# Patient Record
Sex: Male | Born: 1950 | Hispanic: No | Marital: Married | Smoking: Former smoker
Health system: Southern US, Community
[De-identification: ages and names within clinical notes are randomized; demographics above are authoritative.]

## PROBLEM LIST (undated history)

## (undated) DIAGNOSIS — J189 Pneumonia, unspecified organism: Secondary | ICD-10-CM

## (undated) DIAGNOSIS — K219 Gastro-esophageal reflux disease without esophagitis: Secondary | ICD-10-CM

## (undated) DIAGNOSIS — I10 Essential (primary) hypertension: Secondary | ICD-10-CM

## (undated) DIAGNOSIS — R42 Dizziness and giddiness: Secondary | ICD-10-CM

## (undated) DIAGNOSIS — I219 Acute myocardial infarction, unspecified: Secondary | ICD-10-CM

## (undated) DIAGNOSIS — E785 Hyperlipidemia, unspecified: Secondary | ICD-10-CM

## (undated) DIAGNOSIS — I2510 Atherosclerotic heart disease of native coronary artery without angina pectoris: Secondary | ICD-10-CM

## (undated) DIAGNOSIS — J310 Chronic rhinitis: Secondary | ICD-10-CM

## (undated) DIAGNOSIS — S065X9A Traumatic subdural hemorrhage with loss of consciousness of unspecified duration, initial encounter: Secondary | ICD-10-CM

## (undated) DIAGNOSIS — S065XAA Traumatic subdural hemorrhage with loss of consciousness status unknown, initial encounter: Secondary | ICD-10-CM

## (undated) HISTORY — PX: NASAL SINUS SURGERY: SHX719

## (undated) HISTORY — DX: Hyperlipidemia, unspecified: E78.5

## (undated) HISTORY — PX: EYE SURGERY: SHX253

## (undated) HISTORY — PX: NOSE SURGERY: SHX723

## (undated) HISTORY — DX: Dizziness and giddiness: R42

## (undated) HISTORY — PX: CORONARY STENT PLACEMENT: SHX1402

## (undated) HISTORY — DX: Atherosclerotic heart disease of native coronary artery without angina pectoris: I25.10

## (undated) HISTORY — PX: HAND SURGERY: SHX662

## (undated) HISTORY — DX: Gastro-esophageal reflux disease without esophagitis: K21.9

---

## 2010-06-27 ENCOUNTER — Emergency Department (HOSPITAL_COMMUNITY): Admission: EM | Admit: 2010-06-27 | Discharge: 2010-06-27 | Payer: Self-pay | Admitting: Emergency Medicine

## 2011-12-21 ENCOUNTER — Observation Stay (HOSPITAL_COMMUNITY)
Admission: EM | Admit: 2011-12-21 | Discharge: 2011-12-27 | Disposition: A | Discharge status: Skilled Nursing Facility | Payer: Medicaid Other | Attending: Internal Medicine | Admitting: Internal Medicine

## 2011-12-21 ENCOUNTER — Emergency Department (HOSPITAL_COMMUNITY): Payer: Medicaid Other

## 2011-12-21 ENCOUNTER — Encounter (HOSPITAL_COMMUNITY): Payer: Self-pay | Admitting: Emergency Medicine

## 2011-12-21 ENCOUNTER — Other Ambulatory Visit: Payer: Self-pay

## 2011-12-21 DIAGNOSIS — R109 Unspecified abdominal pain: Principal | ICD-10-CM | POA: Diagnosis present

## 2011-12-21 DIAGNOSIS — I10 Essential (primary) hypertension: Secondary | ICD-10-CM | POA: Diagnosis present

## 2011-12-21 DIAGNOSIS — Z23 Encounter for immunization: Secondary | ICD-10-CM | POA: Insufficient documentation

## 2011-12-21 DIAGNOSIS — E119 Type 2 diabetes mellitus without complications: Secondary | ICD-10-CM | POA: Diagnosis present

## 2011-12-21 DIAGNOSIS — I2510 Atherosclerotic heart disease of native coronary artery without angina pectoris: Secondary | ICD-10-CM | POA: Insufficient documentation

## 2011-12-21 DIAGNOSIS — K589 Irritable bowel syndrome without diarrhea: Secondary | ICD-10-CM | POA: Insufficient documentation

## 2011-12-21 DIAGNOSIS — D649 Anemia, unspecified: Secondary | ICD-10-CM | POA: Insufficient documentation

## 2011-12-21 DIAGNOSIS — K859 Acute pancreatitis without necrosis or infection, unspecified: Secondary | ICD-10-CM

## 2011-12-21 DIAGNOSIS — F3289 Other specified depressive episodes: Secondary | ICD-10-CM | POA: Insufficient documentation

## 2011-12-21 DIAGNOSIS — E876 Hypokalemia: Secondary | ICD-10-CM | POA: Diagnosis present

## 2011-12-21 DIAGNOSIS — Z9861 Coronary angioplasty status: Secondary | ICD-10-CM | POA: Insufficient documentation

## 2011-12-21 DIAGNOSIS — F329 Major depressive disorder, single episode, unspecified: Secondary | ICD-10-CM | POA: Insufficient documentation

## 2011-12-21 DIAGNOSIS — K838 Other specified diseases of biliary tract: Secondary | ICD-10-CM

## 2011-12-21 HISTORY — DX: Traumatic subdural hemorrhage with loss of consciousness of unspecified duration, initial encounter: S06.5X9A

## 2011-12-21 HISTORY — DX: Chronic rhinitis: J31.0

## 2011-12-21 HISTORY — DX: Traumatic subdural hemorrhage with loss of consciousness status unknown, initial encounter: S06.5XAA

## 2011-12-21 HISTORY — DX: Essential (primary) hypertension: I10

## 2011-12-21 HISTORY — DX: Dizziness and giddiness: R42

## 2011-12-21 HISTORY — DX: Acute myocardial infarction, unspecified: I21.9

## 2011-12-21 HISTORY — DX: Hyperlipidemia, unspecified: E78.5

## 2011-12-21 HISTORY — DX: Pneumonia, unspecified organism: J18.9

## 2011-12-21 LAB — DIFFERENTIAL
Eosinophils Absolute: 0.1 10*3/uL (ref 0.0–0.7)
Eosinophils Relative: 1 % (ref 0–5)
Lymphocytes Relative: 28 % (ref 12–46)
Lymphs Abs: 2.3 10*3/uL (ref 0.7–4.0)
Monocytes Absolute: 0.7 10*3/uL (ref 0.1–1.0)
Monocytes Relative: 9 % (ref 3–12)

## 2011-12-21 LAB — URINALYSIS, ROUTINE W REFLEX MICROSCOPIC
Ketones, ur: 15 mg/dL — AB
Leukocytes, UA: NEGATIVE
Nitrite: NEGATIVE
Protein, ur: NEGATIVE mg/dL
Urobilinogen, UA: 0.2 mg/dL (ref 0.0–1.0)

## 2011-12-21 LAB — PROTIME-INR
INR: 2.42 — ABNORMAL HIGH (ref 0.00–1.49)
Prothrombin Time: 26.7 seconds — ABNORMAL HIGH (ref 11.6–15.2)

## 2011-12-21 LAB — COMPREHENSIVE METABOLIC PANEL
ALT: 20 U/L (ref 0–53)
BUN: 12 mg/dL (ref 6–23)
CO2: 22 mEq/L (ref 19–32)
Calcium: 9.6 mg/dL (ref 8.4–10.5)
Creatinine, Ser: 0.89 mg/dL (ref 0.50–1.35)
GFR calc Af Amer: >90 mL/min (ref >90)
GFR calc non Af Amer: 90 mL/min — ABNORMAL LOW (ref >90)
Glucose, Bld: 116 mg/dL — ABNORMAL HIGH (ref 70–99)
Sodium: 139 mEq/L (ref 135–145)
Total Protein: 6.8 g/dL (ref 6.0–8.3)

## 2011-12-21 LAB — CBC
HCT: 39.8 % (ref 39.0–52.0)
MCH: 28.1 pg (ref 26.0–34.0)
MCV: 82.9 fL (ref 78.0–100.0)
Platelets: 322 10*3/uL (ref 150–400)
RBC: 4.8 MIL/uL (ref 4.22–5.81)
WBC: 8.2 10*3/uL (ref 4.0–10.5)

## 2011-12-21 LAB — LIPASE, BLOOD: Lipase: 69 U/L — ABNORMAL HIGH (ref 11–59)

## 2011-12-21 LAB — CARDIAC PANEL(CRET KIN+CKTOT+MB+TROPI): Troponin I: <0.3 ng/mL (ref <0.30)

## 2011-12-21 MED ORDER — DOCUSATE SODIUM 100 MG PO CAPS
100.0000 mg | ORAL_CAPSULE | Freq: Two times a day (BID) | ORAL | Status: DC
  Administered 2011-12-21 – 2011-12-22 (×2): 100 mg via ORAL
  Filled 2011-12-21 (×3): qty 1

## 2011-12-21 MED ORDER — GABAPENTIN 100 MG PO CAPS
100.0000 mg | ORAL_CAPSULE | Freq: Three times a day (TID) | ORAL | Status: DC
  Administered 2011-12-21 – 2011-12-23 (×5): 100 mg via ORAL
  Filled 2011-12-21 (×7): qty 1

## 2011-12-21 MED ORDER — PANTOPRAZOLE SODIUM 40 MG PO TBEC
40.0000 mg | DELAYED_RELEASE_TABLET | Freq: Every day | ORAL | Status: DC
  Administered 2011-12-21 – 2011-12-27 (×7): 40 mg via ORAL
  Filled 2011-12-21 (×7): qty 1

## 2011-12-21 MED ORDER — FLUOXETINE HCL 40 MG PO CAPS: 80.0000 mg | ORAL_CAPSULE | Freq: Every day | ORAL | Status: DC

## 2011-12-21 MED ORDER — TAMSULOSIN HCL 0.4 MG PO CAPS
0.4000 mg | ORAL_CAPSULE | Freq: Every day | ORAL | Status: DC
  Administered 2011-12-21 – 2011-12-26 (×6): 0.4 mg via ORAL
  Filled 2011-12-21 (×8): qty 1

## 2011-12-21 MED ORDER — INSULIN ASPART 100 UNIT/ML ~~LOC~~ SOLN
0.0000 [IU] | Freq: Three times a day (TID) | SUBCUTANEOUS | Status: DC
  Administered 2011-12-23: 1 [IU] via SUBCUTANEOUS
  Filled 2011-12-21: qty 3

## 2011-12-21 MED ORDER — INSULIN ASPART 100 UNIT/ML ~~LOC~~ SOLN: 0.0000 [IU] | Freq: Every day | SUBCUTANEOUS | Status: DC

## 2011-12-21 MED ORDER — MEMANTINE HCL 10 MG PO TABS
10.0000 mg | ORAL_TABLET | Freq: Two times a day (BID) | ORAL | Status: DC
  Administered 2011-12-21 – 2011-12-23 (×4): 10 mg via ORAL
  Filled 2011-12-21 (×5): qty 1

## 2011-12-21 MED ORDER — SODIUM CHLORIDE 0.9 % IJ SOLN
3.0000 mL | Freq: Two times a day (BID) | INTRAMUSCULAR | Status: DC
  Administered 2011-12-22 – 2011-12-25 (×8): 3 mL via INTRAVENOUS

## 2011-12-21 MED ORDER — SIMVASTATIN 20 MG PO TABS
20.0000 mg | ORAL_TABLET | Freq: Every day | ORAL | Status: DC
  Administered 2011-12-22 – 2011-12-26 (×5): 20 mg via ORAL
  Filled 2011-12-21 (×6): qty 1

## 2011-12-21 MED ORDER — MORPHINE SULFATE 4 MG/ML IJ SOLN
4.0000 mg | Freq: Once | INTRAMUSCULAR | Status: AC
  Administered 2011-12-21: 4 mg via INTRAVENOUS
  Filled 2011-12-21: qty 1

## 2011-12-21 MED ORDER — METOPROLOL SUCCINATE ER 50 MG PO TB24
50.0000 mg | ORAL_TABLET | Freq: Every day | ORAL | Status: DC
  Administered 2011-12-22 – 2011-12-27 (×6): 50 mg via ORAL
  Filled 2011-12-21 (×6): qty 1

## 2011-12-21 MED ORDER — ASPIRIN EC 81 MG PO TBEC
81.0000 mg | DELAYED_RELEASE_TABLET | Freq: Every day | ORAL | Status: DC
  Administered 2011-12-21 – 2011-12-27 (×7): 81 mg via ORAL
  Filled 2011-12-21 (×7): qty 1

## 2011-12-21 MED ORDER — LISINOPRIL 5 MG PO TABS
5.0000 mg | ORAL_TABLET | Freq: Every day | ORAL | Status: DC
  Administered 2011-12-21 – 2011-12-27 (×7): 5 mg via ORAL
  Filled 2011-12-21 (×7): qty 1

## 2011-12-21 MED ORDER — POLYVINYL ALCOHOL 1.4 % OP SOLN
1.0000 [drp] | OPHTHALMIC | Status: DC | PRN
  Filled 2011-12-21: qty 15

## 2011-12-21 MED ORDER — ONDANSETRON HCL 4 MG/2ML IJ SOLN: 4.0000 mg | Freq: Four times a day (QID) | INTRAMUSCULAR | Status: DC | PRN

## 2011-12-21 MED ORDER — IOHEXOL 300 MG/ML  SOLN: 20.0000 mL | INTRAMUSCULAR | Status: AC

## 2011-12-21 MED ORDER — ONDANSETRON HCL 4 MG PO TABS: 4.0000 mg | ORAL_TABLET | Freq: Four times a day (QID) | ORAL | Status: DC | PRN

## 2011-12-21 MED ORDER — SODIUM CHLORIDE 0.9 % IV SOLN
Freq: Once | INTRAVENOUS | Status: AC
  Administered 2011-12-21: 13:00:00 via INTRAVENOUS

## 2011-12-21 MED ORDER — POTASSIUM CHLORIDE CRYS ER 20 MEQ PO TBCR
40.0000 meq | EXTENDED_RELEASE_TABLET | Freq: Once | ORAL | Status: AC
  Administered 2011-12-21: 40 meq via ORAL
  Filled 2011-12-21: qty 2

## 2011-12-21 MED ORDER — SODIUM CHLORIDE 0.9 % IJ SOLN: 3.0000 mL | INTRAMUSCULAR | Status: DC | PRN

## 2011-12-21 MED ORDER — POLYETHYL GLYCOL-PROPYL GLYCOL 0.4-0.3 % OP SOLN: 1.0000 [drp] | Freq: Every day | OPHTHALMIC | Status: DC | PRN

## 2011-12-21 MED ORDER — ONDANSETRON HCL 4 MG/2ML IJ SOLN
4.0000 mg | Freq: Once | INTRAMUSCULAR | Status: AC
  Administered 2011-12-21: 4 mg via INTRAVENOUS
  Filled 2011-12-21: qty 2

## 2011-12-21 MED ORDER — HYDROMORPHONE HCL PF 1 MG/ML IJ SOLN
1.0000 mg | Freq: Once | INTRAMUSCULAR | Status: AC
  Administered 2011-12-21: 1 mg via INTRAVENOUS
  Filled 2011-12-21: qty 1

## 2011-12-21 MED ORDER — SUCRALFATE 1 GM/10ML PO SUSP
1.0000 g | Freq: Four times a day (QID) | ORAL | Status: DC
  Administered 2011-12-21 – 2011-12-27 (×23): 1 g via ORAL
  Filled 2011-12-21 (×26): qty 10

## 2011-12-21 MED ORDER — GI COCKTAIL ~~LOC~~
30.0000 mL | Freq: Two times a day (BID) | ORAL | Status: DC | PRN
  Administered 2011-12-21 – 2011-12-25 (×5): 30 mL via ORAL
  Filled 2011-12-21 (×6): qty 30

## 2011-12-21 MED ORDER — NITROGLYCERIN 0.4 MG SL SUBL: 0.4000 mg | SUBLINGUAL_TABLET | SUBLINGUAL | Status: DC | PRN

## 2011-12-21 MED ORDER — IOHEXOL 300 MG/ML  SOLN
100.0000 mL | Freq: Once | INTRAMUSCULAR | Status: AC | PRN
  Administered 2011-12-21: 100 mL via INTRAVENOUS

## 2011-12-21 MED ORDER — SODIUM CHLORIDE 0.9 % IV SOLN: 250.0000 mL | INTRAVENOUS | Status: DC | PRN

## 2011-12-21 NOTE — ED Notes (Signed)
[id number] 

## 2011-12-21 NOTE — ED Notes (Signed)
MD at bedside. 

## 2011-12-21 NOTE — ED Notes (Signed)
Pt drinking contrast fluid

## 2011-12-21 NOTE — ED Notes (Signed)
Reordering urinalysis as pt was unable to produce urine.  Collection was removed by mistake

## 2011-12-21 NOTE — ED Notes (Signed)
Pt

## 2011-12-21 NOTE — ED Notes (Signed)
Flow manager called to er stating that pt inpatient bed was going to be changed to 5503, as the admitting doctor wanted pt to be a tele bed.  ED bed request was for tele as dictated by the er physician.  Flow manager put the wrong bed request and pt is in the initial inpatient bed of 5126 that was assigned to him.  Flow manager states that now we need to call 5500 and let them know that pt is going to be coming to them.   ER charge nurse notified of the situation.
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
## 2011-12-21 NOTE — ED Notes (Signed)
Pt presents with rt sided chest pain.  History of PE, MI and recent pneumonia. Diabetes.  Was woken with pain in rt chest and rt upper abdominal areas.

## 2011-12-21 NOTE — ED Notes (Signed)
Pt sleeping at this time.

## 2011-12-21 NOTE — ED Notes (Signed)
[id number] Ready 

## 2011-12-21 NOTE — ED Provider Notes (Signed)
History     CSN: [id number]  Arrival date & time 12/21/11  1120   First MD Initiated Contact with Patient 12/21/11 1127      Chief Complaint  Patient presents with  . Chest Pain    (Consider location/radiation/quality/duration/timing/severity/associated sxs/prior treatment) Patient is a 61 y.o. male presenting with chest pain. The history is provided by the patient. The history is limited by the condition of the patient (Poor historian).  Chest Pain   Pain is actually in the right upper abdomen and extending down toward the umbilicus. He cannot describe the pain nor can he give a number for its intensity and when asked more specifically about it does states that he is too tired to answer questions. Niacin fever, chills, sweats and denies nausea or vomiting. He is complaining of some dyspnea. Denies constipation or diarrhea. I cannot get any further coherent history from the patient.  Past Medical History  Diagnosis Date  . MI (myocardial infarction)   . Pneumonia   . PE (pulmonary embolism)     No past surgical history on file.  No family history on file.  History  Substance Use Topics  . Smoking status: Not on file  . Smokeless tobacco: Not on file  . Alcohol Use:       Review of Systems  Unable to perform ROS: Other  Cardiovascular: Positive for chest pain.    Allergies  Review of patient's allergies indicates no known allergies.  Home Medications   Current Outpatient Rx  Name Route Sig Dispense Refill  . CALCIUM CARBONATE-VITAMIN D 500-200 MG-UNIT PO TABS Oral Take 1 tablet by mouth daily.    Marland Kitchen FLUOXETINE HCL 40 MG PO CAPS Oral Take 80 mg by mouth daily.    Marland Kitchen GABAPENTIN 100 MG PO CAPS Oral Take 100 mg by mouth 3 (three) times daily.    Marland Kitchen HYDROXYZINE HCL 50 MG PO TABS Oral Take 50 mg by mouth 4 (four) times daily.    . IBUPROFEN 800 MG PO TABS Oral Take 800 mg by mouth 3 (three) times daily.    Marland Kitchen KETOROLAC TROMETHAMINE 0.5 % OP SOLN Ophthalmic Apply 1 drop  to eye 4 (four) times daily.    Marland Kitchen LISINOPRIL 5 MG PO TABS Oral Take 5 mg by mouth daily.    Marland Kitchen LORATADINE 10 MG PO TABS Oral Take 10 mg by mouth daily as needed. For allergies    . MEGESTROL ACETATE 40 MG/ML PO SUSP Oral Take 400 mg by mouth 2 (two) times daily.    Marland Kitchen MEMANTINE HCL 10 MG PO TABS Oral Take 10 mg by mouth 2 (two) times daily.    Marland Kitchen METFORMIN HCL 500 MG PO TABS Oral Take 500 mg by mouth 2 (two) times daily.    Marland Kitchen METOPROLOL SUCCINATE ER 50 MG PO TB24 Oral Take 50 mg by mouth 2 (two) times daily. Take with or immediately following a meal.    . ADULT MULTIVITAMIN W/MINERALS CH Oral Take 1 tablet by mouth daily.    Marland Kitchen NALTREXONE HCL 50 MG PO TABS Oral Take 50 mg by mouth daily.    Marland Kitchen NITROGLYCERIN 0.4 MG SL SUBL Sublingual Place 0.4 mg under the tongue every 5 (five) minutes as needed. For chest pain    . OMEPRAZOLE 40 MG PO CPDR Oral Take 40 mg by mouth daily.    Marland Kitchen PILOCARPINE HCL 5 MG PO TABS Oral Take 5 mg by mouth 4 (four) times daily.    Marland Kitchen POLYETHYL GLYCOL-PROPYL  GLYCOL 0.4-0.3 % OP SOLN Ophthalmic Apply 1 drop to eye daily as needed. For dry eyes    . PRAVASTATIN SODIUM 40 MG PO TABS Oral Take 40 mg by mouth daily.    . SUCRALFATE 1 GM/10ML PO SUSP Oral Take 1 g by mouth 4 (four) times daily.    Marland Kitchen TAMSULOSIN HCL 0.4 MG PO CAPS Oral Take 0.4 mg by mouth at bedtime.    . TOPIRAMATE 25 MG PO CPSP Oral Take 25 mg by mouth 3 (three) times daily.    . TRAMADOL HCL 50 MG PO TABS Oral Take 50 mg by mouth every 8 (eight) hours as needed. For pain    . WARFARIN SODIUM 5 MG PO TABS Oral Take 5 mg by mouth as directed.      BP 132/100  Pulse 93  Temp(Src) 98.6 F (37 C) (Oral)  Resp 20  SpO2 99%  Physical Exam  Nursing note and vitals reviewed.  D30-year-old male who appears somewhat dyspneic. Respiratory rate is reported at 20 however I'm count closer to 30. Vital signs are significant for mild hypertension with blood pressure 132/100. Oxygen saturation is 99% which is normal. Head is  normocephalic and atraumatic. PERRLA, EOMI. There is no scleral icterus. Mucous membranes are moist. Neck is supple without adenopathy or JVD and is nontender. Back is nontender. Lungs are clear without rales, wheezes, or rhonchi. There is no chest wall tenderness. Heart has regular rate rhythm without murmur. Abdomen is soft with moderate right upper quadrant tenderness with positive Murphy sign. There is no hepatosplenomegaly. No other tenderness is identified. Peristalsis is diminished. Extremities have no cyanosis or edema, full range of motion is present. Skin is warm and moist without rash. Logic: Mental status is normal, cranial nerves are intact, there no focal motor or sensory deficits.  ED Course  Procedures (including critical care time)  Results for orders placed during the hospital encounter of 12/21/11  CBC      Component Value Range   WBC 8.2  4.0 - 10.5 (K/uL)   RBC 4.80  4.22 - 5.81 (MIL/uL)   Hemoglobin 13.5  13.0 - 17.0 (g/dL)   HCT 21.3  08.6 - 57.8 (%)   MCV 82.9  78.0 - 100.0 (fL)   MCH 28.1  26.0 - 34.0 (pg)   MCHC 33.9  30.0 - 36.0 (g/dL)   RDW 46.9  62.9 - 52.8 (%)   Platelets 322  150 - 400 (K/uL)  DIFFERENTIAL      Component Value Range   Neutrophils Relative 61  43 - 77 (%)   Neutro Abs 5.0  1.7 - 7.7 (K/uL)   Lymphocytes Relative 28  12 - 46 (%)   Lymphs Abs 2.3  0.7 - 4.0 (K/uL)   Monocytes Relative 9  3 - 12 (%)   Monocytes Absolute 0.7  0.1 - 1.0 (K/uL)   Eosinophils Relative 1  0 - 5 (%)   Eosinophils Absolute 0.1  0.0 - 0.7 (K/uL)   Basophils Relative 1  0 - 1 (%)   Basophils Absolute 0.0  0.0 - 0.1 (K/uL)  COMPREHENSIVE METABOLIC PANEL      Component Value Range   Sodium 139  135 - 145 (mEq/L)   Potassium 3.3 (*) 3.5 - 5.1 (mEq/L)   Chloride 106  96 - 112 (mEq/L)   CO2 22  19 - 32 (mEq/L)   Glucose, Bld 116 (*) 70 - 99 (mg/dL)   BUN 12  6 - 23 (mg/dL)  Creatinine, Ser 0.89  0.50 - 1.35 (mg/dL)   Calcium 9.6  8.4 - 16.1 (mg/dL)   Total Protein  6.8  6.0 - 8.3 (g/dL)   Albumin 3.4 (*) 3.5 - 5.2 (g/dL)   AST 17  0 - 37 (U/L)   ALT 20  0 - 53 (U/L)   Alkaline Phosphatase 157 (*) 39 - 117 (U/L)   Total Bilirubin 0.3  0.3 - 1.2 (mg/dL)   GFR calc non Af Amer 90 (*) >90 (mL/min)   GFR calc Af Amer >90  >90 (mL/min)  LIPASE, BLOOD      Component Value Range   Lipase 69 (*) 11 - 59 (U/L)  PROTIME-INR      Component Value Range   Prothrombin Time 26.7 (*) 11.6 - 15.2 (seconds)   INR 2.42 (*) 0.00 - 1.49    US Abdomen Complete  12/21/2011  *RADIOLOGY REPORT*  Clinical Data:  Right upper quadrant abdominal pain.  COMPLETE ABDOMINAL ULTRASOUND  Comparison:  None.  Findings:  Gallbladder:  Normal, without wall thickening, stone, or pericholecystic fluid.  Sonographic Murphy's sign was not elicited.  Common bile duct: The common duct measures between 7 and 9 mm. Upper normal for patient age 47-7 mm.  No intrahepatic biliary ductal dilatation.  Liver: Normal in echogenicity, without focal lesion.  IVC: Negative  Pancreas:  Pancreatic tail partially obscured by bowel gas.  Spleen:  Normal in size and echogenicity.  Right Kidney:  12.6 cm. No hydronephrosis.  Mild renal cortical thinning.  A simple cyst measures 7.8 cm in the interpolar right kidney.  A lower pole right-sided renal lesion measures 3.3 cm, including image 46.  This has a suggestion of minimal irregularity in its posterior wall.  Left Kidney:  11.6 cm. No hydronephrosis.  Mild renal cortical thinning.  Interpolar 1.8 cm cyst.  Abdominal aorta:  Nonaneurysmal without ascites.  The aorta is partially obscured by bowel gas distally.  IMPRESSION:  1.  Normal gallbladder.  The common duct is minimally dilated for age.  Given the normal bilirubin, this may be within normal variation.  If there is a high clinical concern of biliary obstruction, consider MRCP. 2.  Bilateral renal cystic lesions.  Lower pole right-sided renal lesion has a suggestion of complexity in its posterior portion. This could  represent a complex cyst.  Cystic neoplasm cannot be excluded.  Consider further characterization with non emergent pre and post contrast abdominal CT.  If this is not performed, ultrasound surveillance at 6 - 12 months should be considered.  Original Report Authenticated By: Consuello Bossier, M.D.   Dg Chest Portable 1 View  12/21/2011  *RADIOLOGY REPORT*  Clinical Data: Chest pain  PORTABLE CHEST - 1 VIEW  Comparison: None.  Findings: Mild cardiomegaly.  Low volumes.  Normal pulmonary vascularity.  Mild bibasilar atelectasis.  Bronchitic changes.  No pneumothorax.  IMPRESSION: Cardiomegaly without edema.  Bibasilar atelectasis.  Original Report Authenticated By: Donavan Burnet, M.D.      1. Pancreatitis   2. Common bile duct dilatation      Date: 12/21/2011  Rate: 88  Rhythm: normal sinus rhythm  QRS Axis: left  Intervals: normal  ST/T Wave abnormalities: nonspecific T wave changes  Conduction Disutrbances:left anterior fascicular block and Incomplete right bundle-branch block  Narrative Interpretation: Left anterior fascicular block, incomplete right bundle branch block with a nonspecific T wave flattening. When compared with ECG of 06/27/2010, all of the above changes are new.  Old EKG Reviewed: changes noted  He is given IV morphine with slight relief of pain. This is followed with IV Dilaudid with better relief of pain. Ultrasound shows mildly dilated common bile duct. Elevated lipase and elevated outcome phosphatase suggest some degree of obstruction and a CT has been ordered. Case is discussed with the resident on call for the teaching service and arrangements are made to admit the patient for pain control and further diagnostic evaluation. Need to consider possibility of pancreatic malignancy.  MDM  Abdominal pain and right upper quadrant tenderness-need to rule out biliary colic. Ultrasound has been ordered.        Dione Booze, MD 12/21/11 1455

## 2011-12-21 NOTE — ED Notes (Signed)
Pt states pain 7/10.  Would like more pain medication

## 2011-12-22 ENCOUNTER — Encounter (HOSPITAL_COMMUNITY): Payer: Self-pay | Admitting: General Practice

## 2011-12-22 ENCOUNTER — Other Ambulatory Visit: Payer: Self-pay

## 2011-12-22 LAB — GLUCOSE, CAPILLARY
Glucose-Capillary: 96 mg/dL (ref 70–99)
Glucose-Capillary: 94 mg/dL (ref 70–99)
Glucose-Capillary: 89 mg/dL (ref 70–99)
Glucose-Capillary: 98 mg/dL (ref 70–99)
Glucose-Capillary: 129 mg/dL — ABNORMAL HIGH (ref 70–99)

## 2011-12-22 LAB — CARDIAC PANEL(CRET KIN+CKTOT+MB+TROPI)
CK, MB: 3.4 ng/mL (ref 0.3–4.0)
Relative Index: INVALID (ref 0.0–2.5)
Relative Index: INVALID (ref 0.0–2.5)
Total CK: 48 U/L (ref 7–232)
Troponin I: <0.3 ng/mL (ref <0.30)
Troponin I: <0.3 ng/mL (ref <0.30)

## 2011-12-22 LAB — COMPREHENSIVE METABOLIC PANEL
AST: 15 U/L (ref 0–37)
Albumin: 2.9 g/dL — ABNORMAL LOW (ref 3.5–5.2)
Alkaline Phosphatase: 129 U/L — ABNORMAL HIGH (ref 39–117)
Chloride: 109 mEq/L (ref 96–112)
Potassium: 3.6 mEq/L (ref 3.5–5.1)
Total Bilirubin: 0.3 mg/dL (ref 0.3–1.2)

## 2011-12-22 LAB — PROTIME-INR
INR: 3.03 — ABNORMAL HIGH (ref 0.00–1.49)
Prothrombin Time: 31.9 seconds — ABNORMAL HIGH (ref 11.6–15.2)

## 2011-12-22 LAB — CBC
HCT: 35.6 % — ABNORMAL LOW (ref 39.0–52.0)
HCT: 34.8 % — ABNORMAL LOW (ref 39.0–52.0)
MCH: 28.1 pg (ref 26.0–34.0)
MCH: 27.8 pg (ref 26.0–34.0)
MCHC: 33.7 g/dL (ref 30.0–36.0)
MCHC: 33.3 g/dL (ref 30.0–36.0)
MCV: 83.4 fL (ref 78.0–100.0)
Platelets: 258 10*3/uL (ref 150–400)
Platelets: 245 10*3/uL (ref 150–400)
RBC: 4.27 MIL/uL (ref 4.22–5.81)
RBC: 4.18 MIL/uL — ABNORMAL LOW (ref 4.22–5.81)
RDW: 13.1 % (ref 11.5–15.5)
WBC: 6.9 10*3/uL (ref 4.0–10.5)

## 2011-12-22 LAB — RAPID URINE DRUG SCREEN, HOSP PERFORMED
Barbiturates: NOT DETECTED
Benzodiazepines: NOT DETECTED
Opiates: POSITIVE — AB

## 2011-12-22 LAB — MAGNESIUM: Magnesium: 1.8 mg/dL (ref 1.5–2.5)

## 2011-12-22 LAB — OCCULT BLOOD X 1 CARD TO LAB, STOOL: Fecal Occult Bld: NEGATIVE

## 2011-12-22 MED ORDER — SENNA 8.6 MG PO TABS
1.0000 | ORAL_TABLET | Freq: Every day | ORAL | Status: DC
  Administered 2011-12-22 – 2011-12-27 (×6): 8.6 mg via ORAL
  Filled 2011-12-22 (×6): qty 1

## 2011-12-22 MED ORDER — ACETAMINOPHEN 325 MG PO TABS
650.0000 mg | ORAL_TABLET | Freq: Four times a day (QID) | ORAL | Status: DC | PRN
  Administered 2011-12-22 – 2011-12-27 (×6): 650 mg via ORAL
  Filled 2011-12-22 (×6): qty 2

## 2011-12-22 MED ORDER — FLORA-Q PO CAPS
1.0000 | ORAL_CAPSULE | Freq: Every day | ORAL | Status: DC
  Administered 2011-12-22 – 2011-12-27 (×6): 1 via ORAL
  Filled 2011-12-22 (×6): qty 1

## 2011-12-22 MED ORDER — PNEUMOCOCCAL VAC POLYVALENT 25 MCG/0.5ML IJ INJ
0.5000 mL | INJECTION | INTRAMUSCULAR | Status: AC
  Administered 2011-12-23: 0.5 mL via INTRAMUSCULAR
  Filled 2011-12-22: qty 0.5

## 2011-12-22 MED ORDER — INFLUENZA VIRUS VACC SPLIT PF IM SUSP
0.5000 mL | INTRAMUSCULAR | Status: AC
  Administered 2011-12-23: 0.5 mL via INTRAMUSCULAR
  Filled 2011-12-22: qty 0.5

## 2011-12-22 MED ORDER — MORPHINE SULFATE 2 MG/ML IJ SOLN
2.0000 mg | Freq: Once | INTRAMUSCULAR | Status: AC
  Administered 2011-12-22: 2 mg via INTRAVENOUS
  Filled 2011-12-22: qty 1

## 2011-12-22 MED ORDER — ALIGN PO CAPS: 1.0000 | ORAL_CAPSULE | Freq: Every day | ORAL | Status: DC

## 2011-12-22 MED ORDER — FLUOXETINE HCL 20 MG PO CAPS
40.0000 mg | ORAL_CAPSULE | Freq: Every day | ORAL | Status: DC
  Administered 2011-12-22 – 2011-12-23 (×2): 40 mg via ORAL
  Filled 2011-12-22 (×2): qty 2

## 2011-12-22 NOTE — Progress Notes (Signed)
Late Entry: 12/21/2011 1930  Pt admitted to the unit. Pt is alert and oriented. Pt oriented to room, staff, and call bell. Bed in lowest position. Full assessment to Epic. Call bell with in reach. Told to call for assists. Will continue to monitor.  Ian Morton
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
## 2011-12-22 NOTE — Progress Notes (Signed)
EKG done at 6:45. MD. Called with the results. Ian Morton

## 2011-12-22 NOTE — Progress Notes (Addendum)
Subjective: Still in significant pain.  Tells me he had watery BM.  Ate and tolerated dinner.  No N/V, just sharp RUQ abd pain worse with breathing.    Objective: Vital signs in last 24 hours: Filed Vitals:   12/21/11 1530 12/21/11 1720 12/21/11 2028 12/22/11 0411  BP: 138/90 143/86 153/97 114/73  Pulse: 76 75 78 91  Temp:   98.2 F (36.8 C) 97.6 F (36.4 C)  TempSrc:   Oral Oral  Resp: 18 22 20 20   SpO2: 99% 96% 100% 99%   Weight change:   Intake/Output Summary (Last 24 hours) at 12/22/11 0821 Last data filed at 12/22/11 0406  Gross per 24 hour  Intake      3 ml  Output      0 ml  Net      3 ml   Physical Exam: General: resting in bed, appears uncomfortable/in distress, cooperative to exam HEENT: PERRL, EOMI, no scleral icterus, no conjunctival pallor Cardiac: RRR, no rubs, murmurs or gallops Pulm: clear to auscultation bilaterally, moving normal volumes of air Abd: soft, diffusely tender, but worse in RUQ, nondistended, BS normoactive Ext: warm and well perfused, no pedal edema Neuro: alert and oriented X3, cranial nerves II-XII grossly intact  Lab Results: Basic Metabolic Panel:  Lab 12/22/11 6962 12/21/11 1206  NA 138 139  K 3.6 3.3*  CL 109 106  CO2 20 22  GLUCOSE 90 116*  BUN 9 12  CREATININE 0.99 0.89  CALCIUM 9.0 9.6  MG 1.8 --  PHOS -- --   Liver Function Tests:  Lab 12/22/11 0252 12/21/11 1206  AST 15 17  ALT 18 20  ALKPHOS 129* 157*  BILITOT 0.3 0.3  PROT 5.6* 6.8  ALBUMIN 2.9* 3.4*    Lab 12/21/11 1206  LIPASE 69*  AMYLASE --   CBC:  Lab 12/22/11 0252 12/21/11 1206  WBC 7.2 8.2  NEUTROABS -- 5.0  HGB 12.0* 13.5  HCT 35.6* 39.8  MCV 83.4 82.9  PLT 258 322   Cardiac Enzymes:  Lab 12/22/11 0252 12/21/11 1945  CKTOTAL 48 45  CKMB 3.4 3.1  CKMBINDEX -- --  TROPONINI <0.30 <0.30   CBG:  Lab 12/22/11 0809 12/22/11 0340 12/21/11 2340  GLUCAP 94 96 84   Hemoglobin A1C:  Lab 12/21/11 1945  HGBA1C 6.8*   Coagulation:  Lab  12/22/11 0252 12/21/11 1206  LABPROT 31.9* 26.7*  INR 3.03* 2.42*   Urine Drug Screen: Drugs of Abuse     Component Value Date/Time   LABOPIA POSITIVE* 12/22/2011 0527   COCAINSCRNUR NONE DETECTED 12/22/2011 0527   LABBENZ NONE DETECTED 12/22/2011 0527   AMPHETMU NONE DETECTED 12/22/2011 0527   THCU NONE DETECTED 12/22/2011 0527   LABBARB NONE DETECTED 12/22/2011 0527    Alcohol Level:  Lab 12/21/11 1945  ETH <11   Studies/Results: US Abdomen Complete  12/21/2011  *RADIOLOGY REPORT*  Clinical Data:  Right upper quadrant abdominal pain.  COMPLETE ABDOMINAL ULTRASOUND  Comparison:  None.  Findings:  Gallbladder:  Normal, without wall thickening, stone, or pericholecystic fluid.  Sonographic Murphy's sign was not elicited.  Common bile duct: The common duct measures between 7 and 9 mm. Upper normal for patient age 64-7 mm.  No intrahepatic biliary ductal dilatation.  Liver: Normal in echogenicity, without focal lesion.  IVC: Negative  Pancreas:  Pancreatic tail partially obscured by bowel gas.  Spleen:  Normal in size and echogenicity.  Right Kidney:  12.6 cm. No hydronephrosis.  Mild renal cortical thinning.  A simple cyst measures 7.8 cm in the interpolar right kidney.  A lower pole right-sided renal lesion measures 3.3 cm, including image 46.  This has a suggestion of minimal irregularity in its posterior wall.  Left Kidney:  11.6 cm. No hydronephrosis.  Mild renal cortical thinning.  Interpolar 1.8 cm cyst.  Abdominal aorta:  Nonaneurysmal without ascites.  The aorta is partially obscured by bowel gas distally.  IMPRESSION:  1.  Normal gallbladder.  The common duct is minimally dilated for age.  Given the normal bilirubin, this may be within normal variation.  If there is a high clinical concern of biliary obstruction, consider MRCP. 2.  Bilateral renal cystic lesions.  Lower pole right-sided renal lesion has a suggestion of complexity in its posterior portion. This could represent a complex cyst.   Cystic neoplasm cannot be excluded.  Consider further characterization with non emergent pre and post contrast abdominal CT.  If this is not performed, ultrasound surveillance at 6 - 12 months should be considered.  Original Report Authenticated By: Consuello Bossier, M.D.   Ct Abdomen Pelvis W Contrast  12/21/2011  *RADIOLOGY REPORT*  Clinical Data: Abdominal pain; fever and chills  CT ABDOMEN AND PELVIS WITH CONTRAST  Technique:  Multidetector CT imaging of the abdomen and pelvis was performed following the standard protocol during bolus administration of intravenous contrast.  Contrast: OMNIPAQUE IOHEXOL 300 MG/ML IV SOLN  Comparison: None.  Findings: The lung bases are clear.  Coronary artery, aortoiliac, and mesenteric atherosclerotic calcification is present.  The liver, gallbladder, spleen, pancreas, adrenal glands, urinary bladder have a normal appearance.  There are degenerative changes within the axial spine.  There are bilateral simple renal cysts. The largest is on the right, measuring 7.8 cm.  The bowel is unremarkable with no evidence of gross inflammation or obstruction. There is no pneumoperitoneum, free fluid, or adenopathy within the abdomen or pelvis.  IMPRESSION: There are bilateral simple renal cysts.  The exam is otherwise negative.  Original Report Authenticated By: Brandon Melnick, M.D.   Dg Chest Portable 1 View  12/21/2011  *RADIOLOGY REPORT*  Clinical Data: Chest pain  PORTABLE CHEST - 1 VIEW  Comparison: None.  Findings: Mild cardiomegaly.  Low volumes.  Normal pulmonary vascularity.  Mild bibasilar atelectasis.  Bronchitic changes.  No pneumothorax.  IMPRESSION: Cardiomegaly without edema.  Bibasilar atelectasis.  Original Report Authenticated By: Donavan Burnet, M.D.   Medications: I have reviewed the patient's current medications. Scheduled Meds:   . sodium chloride   Intravenous Once  . aspirin EC  81 mg Oral Daily  . docusate sodium  100 mg Oral BID  . gabapentin  100  mg Oral TID  . HYDROmorphone  1 mg Intravenous Once  . insulin aspart  0-5 Units Subcutaneous QHS  . insulin aspart  0-9 Units Subcutaneous TID WC  . iohexol  20 mL Oral Q1 Hr x 2  . lisinopril  5 mg Oral Daily  . memantine  10 mg Oral BID  . metoprolol succinate  50 mg Oral Daily  .  morphine injection  4 mg Intravenous Once  . ondansetron (ZOFRAN) IV  4 mg Intravenous Once  . ondansetron (ZOFRAN) IV  4 mg Intravenous Once  . pantoprazole  40 mg Oral Q1200  . potassium chloride  40 mEq Oral Once  . simvastatin  20 mg Oral q1800  . sodium chloride  3 mL Intravenous Q12H  . sucralfate  1 g Oral QID  . Tamsulosin  HCl  0.4 mg Oral QHS  . DISCONTD: FLUoxetine  80 mg Oral Daily   Continuous Infusions:  PRN Meds:.sodium chloride, acetaminophen, gi cocktail, iohexol, nitroGLYCERIN, ondansetron (ZOFRAN) IV, ondansetron, polyvinyl alcohol, sodium chloride, DISCONTD: Polyethyl Glycol-Propyl Glycol  Assessment/Plan:  #RUQ pain: Still unclear etiology, but not entirely convincing for pancreatitis.  I spoke with patient's wife, who tells me that pain has been persistent for the last 2 years with significant constipation alternating with diarrhea, with more diarrhea. She also tells me he sometimes loses bowel continence.  She tells me he has had a decreased appetite for months, as well as depression, and he complains all the time.  Patient has been without a job for the last 12 years that his wife can note (they were married 12 years ago).  Their source for financial income is disability, as wife has lost quit her job a month ago as well.  I suspect there is a large psych component, and patient may be suffering from IBS.  Patient has been treated with an SSRI, but that does not seem to have helped.  -Will advance diet, treat with align and senokot   #Normocytic Anemia: Patient's Hb was wnl at admission and has dropped to 11.6 with repeat CBC, no IVF hydration, FOBT (-), will monitor CBC with AML, no  active source of bleeding, mental status is normal.  #Hypokalemia: resolved. Mg wnl.  #CAD: No active issues, CE (-) x 3, continue ASA 81 & SL NTG prn cp  #DM: reasonably well managed, no active issues -cont SSI & CBG ACHS  #HTN: well managed, continue lisinopril and metoprolol  #h/o PE: history verified by PCP records, will request coumadin consult by pharmacy, INR supratherapuetic today  #VTE: anticoagulation   LOS: 1 day   KAPADIA, Athanasios Heldman 12/22/2011, 8:22 AM

## 2011-12-23 LAB — COMPREHENSIVE METABOLIC PANEL
AST: 14 U/L (ref 0–37)
Albumin: 2.9 g/dL — ABNORMAL LOW (ref 3.5–5.2)
Alkaline Phosphatase: 128 U/L — ABNORMAL HIGH (ref 39–117)
BUN: 8 mg/dL (ref 6–23)
CO2: 25 mEq/L (ref 19–32)
Calcium: 9.1 mg/dL (ref 8.4–10.5)
Creatinine, Ser: 0.96 mg/dL (ref 0.50–1.35)
GFR calc Af Amer: >90 mL/min (ref >90)
GFR calc non Af Amer: 87 mL/min — ABNORMAL LOW (ref >90)
Total Bilirubin: 0.3 mg/dL (ref 0.3–1.2)

## 2011-12-23 LAB — CBC
HCT: 36 % — ABNORMAL LOW (ref 39.0–52.0)
MCH: 27.5 pg (ref 26.0–34.0)
MCV: 84.7 fL (ref 78.0–100.0)
Platelets: 248 10*3/uL (ref 150–400)
RBC: 4.25 MIL/uL (ref 4.22–5.81)
RDW: 13.2 % (ref 11.5–15.5)

## 2011-12-23 LAB — GLUCOSE, CAPILLARY
Glucose-Capillary: 140 mg/dL — ABNORMAL HIGH (ref 70–99)
Glucose-Capillary: 93 mg/dL (ref 70–99)
Glucose-Capillary: 99 mg/dL (ref 70–99)

## 2011-12-23 LAB — PROTIME-INR: Prothrombin Time: 34.6 seconds — ABNORMAL HIGH (ref 11.6–15.2)

## 2011-12-23 LAB — OCCULT BLOOD X 1 CARD TO LAB, STOOL: Fecal Occult Bld: NEGATIVE

## 2011-12-23 MED ORDER — CITALOPRAM HYDROBROMIDE 20 MG PO TABS
20.0000 mg | ORAL_TABLET | Freq: Every day | ORAL | Status: DC
  Administered 2011-12-24 – 2011-12-25 (×2): 20 mg via ORAL
  Filled 2011-12-23 (×2): qty 1

## 2011-12-23 MED ORDER — ASPIRIN 81 MG PO TBEC: 81.0000 mg | DELAYED_RELEASE_TABLET | Freq: Every day | ORAL | Status: AC

## 2011-12-23 MED ORDER — CITALOPRAM HYDROBROMIDE 10 MG/5ML PO SOLN: 20.0000 mg | Freq: Every day | ORAL | Status: DC

## 2011-12-23 MED ORDER — CITALOPRAM HYDROBROMIDE 40 MG PO TABS: 40.0000 mg | ORAL_TABLET | Freq: Every day | ORAL | Status: DC

## 2011-12-23 MED ORDER — SENNA 8.6 MG PO TABS: 1.0000 | ORAL_TABLET | Freq: Every day | ORAL | Status: DC

## 2011-12-23 NOTE — Progress Notes (Signed)
ANTICOAGULATION CONSULT NOTE - Follow Up Consult  Pharmacy Consult for coumadin Indication: history of PE   Assessment: 61 yo M admitted with severe abd pain, work-up in process. Pt on Coumadin PTA for hx PE with rising INR (2.4 -> 3->3.37). No bleeding issues noted. Will continue to hold coumadin until INR trends downward.   Goal of Therapy:  INR 2-3   Plan:  Hold coumadin today INR in am  No Known Allergies  Patient Measurements: Height: 5\' 9"  (175.3 cm) Weight: 185 lb (83.915 kg) IBW/kg (Calculated) : 70.7    Vital Signs: Temp: 98.4 F (36.9 C) (01/18 0453) Temp src: Oral (01/18 0453) BP: 108/67 mmHg (01/18 0453) Pulse Rate: 67  (01/18 0453)  Labs:  Basename 12/23/11 0510 12/22/11 0842 12/22/11 0252 12/21/11 1945 12/21/11 1206  HGB 11.7* 11.6* -- -- --  HCT 36.0* 34.8* 35.6* -- --  PLT 248 245 258 -- --  APTT -- -- -- -- --  LABPROT 34.6* -- 31.9* -- 26.7*  INR 3.37* -- 3.03* -- 2.42*  HEPARINUNFRC -- -- -- -- --  CREATININE 0.96 -- 0.99 -- 0.89  CKTOTAL -- 45 48 45 --  CKMB -- 3.3 3.4 3.1 --  TROPONINI -- <0.30 <0.30 <0.30 --   Estimated Creatinine Clearance: 79.8 ml/min (by C-G formula based on Cr of 0.96).   Severiano Gilbert 12/23/2011,9:07 AM

## 2011-12-23 NOTE — Progress Notes (Signed)
Covering Clinical Social Worker completed psychosocial assessment, which can be found in shadow chart. CSW met with patient to provide the SNF packet to him. In discussion with the patient regarding discharge disposition of short term SNF, the patient is not agreeable to it at this time. CSW will continue to follow.   Rozetta Nunnery MSW, [employer] [phone]
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
## 2011-12-23 NOTE — Progress Notes (Addendum)
Subjective: Still c/o abdominal pain for 24h for the last 2 years.  No BM since yesterday morning.  No CP/palpitations, some SOB, no dizziness, tolerated dinner but tells me he did not eat a lot because he can not taste and has not been able to taste for the last 2.5 years.  Objective: Vital signs in last 24 hours: Filed Vitals:   12/22/11 0924 12/22/11 1439 12/22/11 2059 12/23/11 0453  BP: 120/78 116/71 106/67 108/67  Pulse: 83 72 68 67  Temp:  98.2 F (36.8 C) 98.2 F (36.8 C) 98.4 F (36.9 C)  TempSrc:   Oral Oral  Resp:  19 20 20   Height:      Weight:      SpO2:  100% 97% 92%   Weight change:   Intake/Output Summary (Last 24 hours) at 12/23/11 0812 Last data filed at 12/23/11 0454  Gross per 24 hour  Intake    483 ml  Output      2 ml  Net    481 ml   Physical Exam: General: resting in chair, no acute distress, cooperative to exam HEENT: PERRL, EOMI, no scleral icterus, no conjunctival pallor Cardiac: RRR, no rubs, murmurs or gallops Pulm: clear to auscultation bilaterally, moving normal volumes of air Abd: soft, nontender, nondistended but obese, BS normoactive Ext: warm and well perfused, no pedal edema Neuro: alert and oriented X3, cranial nerves II-XII grossly intact  Lab Results: Basic Metabolic Panel:  Lab 12/23/11 1610 12/22/11 0252  NA 141 138  K 3.5 3.6  CL 109 109  CO2 25 20  GLUCOSE 90 90  BUN 8 9  CREATININE 0.96 0.99  CALCIUM 9.1 9.0  MG -- 1.8  PHOS -- --   Liver Function Tests:  Lab 12/23/11 0510 12/22/11 0252  AST 14 15  ALT 16 18  ALKPHOS 128* 129*  BILITOT 0.3 0.3  PROT 5.7* 5.6*  ALBUMIN 2.9* 2.9*    Lab 12/21/11 1206  LIPASE 69*  AMYLASE --   CBC:  Lab 12/23/11 0510 12/22/11 0842 12/21/11 1206  WBC 7.0 6.9 --  NEUTROABS -- -- 5.0  HGB 11.7* 11.6* --  HCT 36.0* 34.8* --  MCV 84.7 83.3 --  PLT 248 245 --   Cardiac Enzymes:  Lab 12/22/11 0842 12/22/11 0252 12/21/11 1945  CKTOTAL 45 48 45  CKMB 3.3 3.4 3.1    CKMBINDEX -- -- --  TROPONINI <0.30 <0.30 <0.30   CBG:  Lab 12/23/11 0739 12/23/11 0451 12/23/11 0003 12/22/11 2011 12/22/11 1610 12/22/11 1219  GLUCAP 100* 99 93 129* 98 89   Hemoglobin A1C:  Lab 12/21/11 1945  HGBA1C 6.8*   Coagulation:  Lab 12/23/11 0510 12/22/11 0252 12/21/11 1206  LABPROT 34.6* 31.9* 26.7*  INR 3.37* 3.03* 2.42*   Urine Drug Screen: Drugs of Abuse     Component Value Date/Time   LABOPIA POSITIVE* 12/22/2011 0527   COCAINSCRNUR NONE DETECTED 12/22/2011 0527   LABBENZ NONE DETECTED 12/22/2011 0527   AMPHETMU NONE DETECTED 12/22/2011 0527   THCU NONE DETECTED 12/22/2011 0527   LABBARB NONE DETECTED 12/22/2011 0527    Alcohol Level:  Lab 12/21/11 1945  ETH <11    Studies/Results: US Abdomen Complete  12/21/2011  *RADIOLOGY REPORT*  Clinical Data:  Right upper quadrant abdominal pain.  COMPLETE ABDOMINAL ULTRASOUND  Comparison:  None.  Findings:  Gallbladder:  Normal, without wall thickening, stone, or pericholecystic fluid.  Sonographic Murphy's sign was not elicited.  Common bile duct: The common duct measures  between 7 and 9 mm. Upper normal for patient age 69-7 mm.  No intrahepatic biliary ductal dilatation.  Liver: Normal in echogenicity, without focal lesion.  IVC: Negative  Pancreas:  Pancreatic tail partially obscured by bowel gas.  Spleen:  Normal in size and echogenicity.  Right Kidney:  12.6 cm. No hydronephrosis.  Mild renal cortical thinning.  A simple cyst measures 7.8 cm in the interpolar right kidney.  A lower pole right-sided renal lesion measures 3.3 cm, including image 46.  This has a suggestion of minimal irregularity in its posterior wall.  Left Kidney:  11.6 cm. No hydronephrosis.  Mild renal cortical thinning.  Interpolar 1.8 cm cyst.  Abdominal aorta:  Nonaneurysmal without ascites.  The aorta is partially obscured by bowel gas distally.  IMPRESSION:  1.  Normal gallbladder.  The common duct is minimally dilated for age.  Given the normal  bilirubin, this may be within normal variation.  If there is a high clinical concern of biliary obstruction, consider MRCP. 2.  Bilateral renal cystic lesions.  Lower pole right-sided renal lesion has a suggestion of complexity in its posterior portion. This could represent a complex cyst.  Cystic neoplasm cannot be excluded.  Consider further characterization with non emergent pre and post contrast abdominal CT.  If this is not performed, ultrasound surveillance at 6 - 12 months should be considered.  Original Report Authenticated By: Consuello Bossier, M.D.   Ct Abdomen Pelvis W Contrast  12/21/2011  *RADIOLOGY REPORT*  Clinical Data: Abdominal pain; fever and chills  CT ABDOMEN AND PELVIS WITH CONTRAST  Technique:  Multidetector CT imaging of the abdomen and pelvis was performed following the standard protocol during bolus administration of intravenous contrast.  Contrast: OMNIPAQUE IOHEXOL 300 MG/ML IV SOLN  Comparison: None.  Findings: The lung bases are clear.  Coronary artery, aortoiliac, and mesenteric atherosclerotic calcification is present.  The liver, gallbladder, spleen, pancreas, adrenal glands, urinary bladder have a normal appearance.  There are degenerative changes within the axial spine.  There are bilateral simple renal cysts. The largest is on the right, measuring 7.8 cm.  The bowel is unremarkable with no evidence of gross inflammation or obstruction. There is no pneumoperitoneum, free fluid, or adenopathy within the abdomen or pelvis.  IMPRESSION: There are bilateral simple renal cysts.  The exam is otherwise negative.  Original Report Authenticated By: Brandon Melnick, M.D.   Dg Chest Portable 1 View  12/21/2011  *RADIOLOGY REPORT*  Clinical Data: Chest pain  PORTABLE CHEST - 1 VIEW  Comparison: None.  Findings: Mild cardiomegaly.  Low volumes.  Normal pulmonary vascularity.  Mild bibasilar atelectasis.  Bronchitic changes.  No pneumothorax.  IMPRESSION: Cardiomegaly without edema.   Bibasilar atelectasis.  Original Report Authenticated By: Donavan Burnet, M.D.   Medications: I have reviewed the patient's current medications. Scheduled Meds:   . aspirin EC  81 mg Oral Daily  . Flora-Q  1 capsule Oral Daily  . FLUoxetine  40 mg Oral Daily  . gabapentin  100 mg Oral TID  . influenza  inactive virus vaccine  0.5 mL Intramuscular Tomorrow-1000  . insulin aspart  0-5 Units Subcutaneous QHS  . insulin aspart  0-9 Units Subcutaneous TID WC  . lisinopril  5 mg Oral Daily  . memantine  10 mg Oral BID  . metoprolol succinate  50 mg Oral Daily  .  morphine injection  2 mg Intravenous Once  . pantoprazole  40 mg Oral Q1200  . pneumococcal 23  valent vaccine  0.5 mL Intramuscular Tomorrow-1000  . senna  1 tablet Oral Daily  . simvastatin  20 mg Oral q1800  . sodium chloride  3 mL Intravenous Q12H  . sucralfate  1 g Oral QID  . Tamsulosin HCl  0.4 mg Oral QHS  . DISCONTD: bifidobacterium infantis  1 capsule Oral Daily  . DISCONTD: docusate sodium  100 mg Oral BID   Continuous Infusions:  PRN Meds:.sodium chloride, acetaminophen, gi cocktail, nitroGLYCERIN, ondansetron (ZOFRAN) IV, ondansetron, polyvinyl alcohol, sodium chloride  Assessment/Plan: #RUQ pain: Still unclear etiology, but not entirely convincing for pancreatitis.  Patient has had significant GI work up in the past, including negative MCRP.  I suspect there is a large psych component, and patient may be suffering from IBS. Patient has been treated with an SSRI, but that does not seem to have helped.   PT today recommends PT in an acute care setting since patient is so deconditioned.  I spoke with pt's PCP, who tells me that he was d/c from [facility] on Jan 4 with diagnosis of PE, Left popliteal DVT, chronic dementia (post concussive sydnrome), CAD and IBS.  He has had an extensive GI work up in the past, all tests negative.  He is set up with outpatient psychiatry (Dr. Jeannine Kitten) and therapist named  Josiah Lobo comes to [facility].    Later I spoke with wife (after patient agreed), who tells me patient has been bed bound more or less for the last 3-4 months and has not seen Dr. Jeannine Kitten since then.  Korty last came 3-4 weeks PTA, but patient was noted to be too "sleepy, sick, in the bathroom."  She tells me that she herself has health problems and has difficulty caring for him.  She notes her son is also seen by [facility] for therapy.  She is agreeable to SNF.   -Will advance diet, treat with align and senokot  -Will discuss with CSW for SNF placement (family prefers a clean facility in [facility], since that is close to home and wife apparently doesn't drive well) -Will change fluoxetine to citalopram for better GI side effect profile -Will d/c gabapentin, hydroxyzine, Loratadine, Metformin, Namenda, Pilocarpine, Topiragen, Topiramate, Tramadol, Naltrexone (PCP noted no history of opioid or EtOH abuse) and Triamcinolone from home med list and see how patient does.  If any of these medications need to be added on again, they should be done one at a time, in a slow fashion.   #Depression: Patient was on fluoxetine 40mg  daily.  Will change to Citalopram 40mg  daily given decreased GI side effect profile.  Will start with 20mg  tomorrow to monitor for idiosyncratic side effect to medication.  No SI/HI, patient agreeable to plan.  #Normocytic Anemia: I suspect hemoglobin at admission was not true since last 3 Hb have been stable around 12. Pt did not receive IVF hydration, FOBT (-), no active source of bleeding, mental status is normal.   #Hypokalemia: resolved. Mg wnl.   #CAD: No active issues, CE (-) x 3, continue ASA 81 & SL NTG prn cp  -Continue ASA 81 and metoprolol  #DM: well managed, no active issues  -cont SSI & CBG ACHS  -will d/c metformin from home med list in setting of GI disturbances  #HTN: well managed, continue lisinopril and  metoprolol   #h/o PE: history verified by PCP records, will request coumadin consult by pharmacy, INR supratherapuetic today  -continue  to hold coumadin in setting of supratherapuetic inr  #VTE: anticoagulation, supratx today.   LOS: 2 days   KAPADIA, Chip Canepa 12/23/2011, 8:12 AM
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
## 2011-12-23 NOTE — Progress Notes (Signed)
Md. Stated per sticky note that the pt. Does not need to be on tele . Pt. Will be taken off tele this morning. Ian Morton Ian Morton Ian Morton

## 2011-12-24 LAB — CBC
HCT: 34.9 % — ABNORMAL LOW (ref 39.0–52.0)
Hemoglobin: 11.7 g/dL — ABNORMAL LOW (ref 13.0–17.0)
MCH: 27.8 pg (ref 26.0–34.0)
MCHC: 33.5 g/dL (ref 30.0–36.0)
MCV: 82.9 fL (ref 78.0–100.0)
RBC: 4.21 MIL/uL — ABNORMAL LOW (ref 4.22–5.81)
WBC: 10.1 10*3/uL (ref 4.0–10.5)

## 2011-12-24 LAB — PROTIME-INR
INR: 2.5 — ABNORMAL HIGH (ref 0.00–1.49)
Prothrombin Time: 27.4 seconds — ABNORMAL HIGH (ref 11.6–15.2)

## 2011-12-24 LAB — GLUCOSE, CAPILLARY
Glucose-Capillary: 110 mg/dL — ABNORMAL HIGH (ref 70–99)
Glucose-Capillary: 117 mg/dL — ABNORMAL HIGH (ref 70–99)
Glucose-Capillary: 121 mg/dL — ABNORMAL HIGH (ref 70–99)

## 2011-12-24 MED ORDER — WARFARIN SODIUM 5 MG PO TABS
5.0000 mg | ORAL_TABLET | Freq: Once | ORAL | Status: AC
  Administered 2011-12-24: 5 mg via ORAL
  Filled 2011-12-24: qty 1

## 2011-12-24 NOTE — Progress Notes (Signed)
Subjective: He states that his pain is "so and so".  He wants to go home prior going to SNF.  Objective: Vital signs in last 24 hours: Filed Vitals:   12/23/11 1024 12/23/11 1300 12/23/11 2100 12/24/11 0514  BP: 124/73 152/88 121/81 117/71  Pulse: 72 70 64 78  Temp:  98 F (36.7 C) 98.2 F (36.8 C) 97.8 F (36.6 C)  TempSrc:  Oral Oral Oral  Resp:  20 20 20   Height:      Weight:      SpO2:  99% 97% 94%   Weight change:   Intake/Output Summary (Last 24 hours) at 12/24/11 1150 Last data filed at 12/23/11 2200  Gross per 24 hour  Intake    123 ml  Output      0 ml  Net    123 ml   Physical Exam: General: resting in bed, no acute distress, cooperative to exam  HEENT: PERRL, EOMI, no scleral icterus, no conjunctival pallor  Cardiac: RRR, no rubs, murmurs or gallops  Pulm: clear to auscultation bilaterally, moving normal volumes of air  Abd: soft, slightly tender to palpation, nondistended but obese, BS normoactive  Ext: warm and well perfused, no pedal edema  Neuro:nonfocal  Lab Results: Basic Metabolic Panel:  Lab 12/23/11 1610 12/22/11 0252  NA 141 138  K 3.5 3.6  CL 109 109  CO2 25 20  GLUCOSE 90 90  BUN 8 9  CREATININE 0.96 0.99  CALCIUM 9.1 9.0  MG -- 1.8  PHOS -- --   Liver Function Tests:  Lab 12/23/11 0510 12/22/11 0252  AST 14 15  ALT 16 18  ALKPHOS 128* 129*  BILITOT 0.3 0.3  PROT 5.7* 5.6*  ALBUMIN 2.9* 2.9*    Lab 12/21/11 1206  LIPASE 69*  AMYLASE --   No results found for this basename: AMMONIA:2 in the last 168 hours CBC:  Lab 12/24/11 0500 12/23/11 0510 12/21/11 1206  WBC 10.1 7.0 --  NEUTROABS -- -- 5.0  HGB 11.7* 11.7* --  HCT 34.9* 36.0* --  MCV 82.9 84.7 --  PLT 219 248 --   Cardiac Enzymes:  Lab 12/22/11 0842 12/22/11 0252 12/21/11 1945  CKTOTAL 45 48 45  CKMB 3.3 3.4 3.1  CKMBINDEX -- -- --  TROPONINI <0.30 <0.30 <0.30   BNP: No results found for this basename: PROBNP:3 in the last 168 hours D-Dimer: No results  found for this basename: DDIMER:2 in the last 168 hours CBG:  Lab 12/24/11 0752 12/24/11 0011 12/23/11 2004 12/23/11 1644 12/23/11 1114 12/23/11 0739  GLUCAP 110* 112* 151* 104* 140* 100*   Hemoglobin A1C:  Lab 12/21/11 1945  HGBA1C 6.8*   Coagulation:  Lab 12/24/11 0500 12/23/11 0510 12/22/11 0252 12/21/11 1206  LABPROT 27.4* 34.6* 31.9* 26.7*  INR 2.50* 3.37* 3.03* 2.42*   Anemia Panel: No results found for this basename: VITAMINB12,FOLATE,FERRITIN,TIBC,IRON,RETICCTPCT in the last 168 hours Urine Drug Screen: Drugs of Abuse     Component Value Date/Time   LABOPIA POSITIVE* 12/22/2011 0527   COCAINSCRNUR NONE DETECTED 12/22/2011 0527   LABBENZ NONE DETECTED 12/22/2011 0527   AMPHETMU NONE DETECTED 12/22/2011 0527   THCU NONE DETECTED 12/22/2011 0527   LABBARB NONE DETECTED 12/22/2011 0527    Alcohol Level:  Lab 12/21/11 1945  ETH <11   Medications: Scheduled Meds:   . aspirin EC  81 mg Oral Daily  . citalopram  20 mg Oral Daily  . Flora-Q  1 capsule Oral Daily  . insulin aspart  0-5 Units Subcutaneous QHS  . insulin aspart  0-9 Units Subcutaneous TID WC  . lisinopril  5 mg Oral Daily  . metoprolol succinate  50 mg Oral Daily  . pantoprazole  40 mg Oral Q1200  . senna  1 tablet Oral Daily  . simvastatin  20 mg Oral q1800  . sodium chloride  3 mL Intravenous Q12H  . sucralfate  1 g Oral QID  . Tamsulosin HCl  0.4 mg Oral QHS  . warfarin  5 mg Oral ONCE-1800  . DISCONTD: citalopram  20 mg Oral Daily  . DISCONTD: FLUoxetine  40 mg Oral Daily  . DISCONTD: gabapentin  100 mg Oral TID  . DISCONTD: memantine  10 mg Oral BID   Continuous Infusions:  PRN Meds:.sodium chloride, acetaminophen, gi cocktail, nitroGLYCERIN, ondansetron (ZOFRAN) IV, ondansetron, polyvinyl alcohol, sodium chloride Assessment/Plan: #RUQ pain: Still unclear etiology, but not entirely convincing for pancreatitis. Patient has had significant GI work up in the past, including negative MCRP. I suspect  there is a large psych component, and patient may be suffering from IBS. Patient has been treated with an SSRI, but that does not seem to have helped.  -Will continue  align and senokot   -Will continue Celexa   #Depression: stable, continue Celexa  #Normocytic Anemia: stable, will continue to monitor CBC .  #Hypokalemia: resolved. Mg wnl.   #CAD: No active issues, CE (-) x 3, continue ASA 81 & SL NTG prn cp  -Continue ASA 81 and metoprolol   #DM: well managed, CBGs 99-150's. -cont SSI & CBG ACHS   #HTN: well managed, continue lisinopril and metoprolol   #h/o PE: : INR therapeutic,  Coumadin per pharmacy to follow  #VTE: Coumadin   Dispo: patient told me today that he wants to go home to shave prior going to SNF.  He feels "dirty" because he has not shaved.  Will need to touch base with SW and wife to discuss dispo plan since SW's note yesterday indicates that patient does not want SNF at this time.       LOS: 3 days   Nasier Thumm 12/24/2011, 11:50 AM

## 2011-12-24 NOTE — Progress Notes (Signed)
ANTICOAGULATION CONSULT NOTE - Follow Up Consult  Pharmacy Consult for coumadin Indication: history of PE, L popliteal DVT,    Assessment: 61 yo M admitted with severe abd pain, work-up in process. Pt on Coumadin PTA for hx PE. INR 2.5 today after coumadin held since admission.  No bleeding issues noted. CBC stable. Goal of Therapy:  INR 2-3   Plan:  Coumadin 5 mg po x 1 dose today, continue daily INR  No Known Allergies  Patient Measurements: Height: 5\' 9"  (175.3 cm) Weight: 185 lb (83.915 kg) IBW/kg (Calculated) : 70.7    Vital Signs: Temp: 97.8 F (36.6 C) (01/19 0514) Temp src: Oral (01/19 0514) BP: 117/71 mmHg (01/19 0514) Pulse Rate: 78  (01/19 0514)  Labs:  Basename 12/24/11 0500 12/23/11 0510 12/22/11 0842 12/22/11 0252 12/21/11 1945 12/21/11 1206  HGB 11.7* 11.7* -- -- -- --  HCT 34.9* 36.0* 34.8* -- -- --  PLT 219 248 245 -- -- --  APTT -- -- -- -- -- --  LABPROT 27.4* 34.6* -- 31.9* -- --  INR 2.50* 3.37* -- 3.03* -- --  HEPARINUNFRC -- -- -- -- -- --  CREATININE -- 0.96 -- 0.99 -- 0.89  CKTOTAL -- -- 45 48 45 --  CKMB -- -- 3.3 3.4 3.1 --  TROPONINI -- -- <0.30 <0.30 <0.30 --   Estimated Creatinine Clearance: 79.8 ml/min (by C-G formula based on Cr of 0.96).   Len Childs T 12/24/2011,10:22 AM

## 2011-12-25 LAB — PROTIME-INR: Prothrombin Time: 21.2 seconds — ABNORMAL HIGH (ref 11.6–15.2)

## 2011-12-25 MED ORDER — CITALOPRAM HYDROBROMIDE 40 MG PO TABS
40.0000 mg | ORAL_TABLET | Freq: Every day | ORAL | Status: DC
  Filled 2011-12-25: qty 1

## 2011-12-25 MED ORDER — WARFARIN SODIUM 7.5 MG PO TABS
7.5000 mg | ORAL_TABLET | Freq: Once | ORAL | Status: AC
  Administered 2011-12-25: 7.5 mg via ORAL
  Filled 2011-12-25: qty 1

## 2011-12-25 NOTE — Progress Notes (Signed)
Clinical Social Work-CSW met with pt to provide only bed offer as of today. Pt would like to be closer to [facility] however understands that this SNF in [place] may be his only option at d/c. CSW will contact SNF in [facility] in am to confirm there is no bed and will proceed with d/c. Jodean Lima, [phone]

## 2011-12-25 NOTE — Progress Notes (Signed)
Subjective: No acute events overnight.  Still c/o abdominal pain.  Small BM this AM.  Ate breakfast this morning. Did not sleep well.  Feels slightly better from admission.  Objective: Vital signs in last 24 hours: Filed Vitals:   12/24/11 0514 12/24/11 1357 12/24/11 2100 12/25/11 0500  BP: 117/71 112/75 131/79 127/78  Pulse: 78 73 69 71  Temp: 97.8 F (36.6 C) 98 F (36.7 C) 97.9 F (36.6 C) 97.9 F (36.6 C)  TempSrc: Oral  Oral Oral  Resp: 20 18 20 18   Height:      Weight:      SpO2: 94% 99% 99% 98%   Weight change:   Intake/Output Summary (Last 24 hours) at 12/25/11 0812 Last data filed at 12/25/11 0500  Gross per 24 hour  Intake    840 ml  Output      3 ml  Net    837 ml   Physical Exam: General: resting in bed, NAD HEENT: PERRL, EOMI, no scleral icterus, no conjunctival pallor Cardiac: RRR, no rubs, murmurs or gallops Pulm: clear to auscultation bilaterally, moving normal volumes of air Abd: soft, BS normoactive, tender to palpation, but nontender to deep auscultation Ext: warm and well perfused, no pedal edema Neuro: alert and oriented X3  Lab Results: Basic Metabolic Panel:  Lab 12/23/11 1610 12/22/11 0252  NA 141 138  K 3.5 3.6  CL 109 109  CO2 25 20  GLUCOSE 90 90  BUN 8 9  CREATININE 0.96 0.99  CALCIUM 9.1 9.0  MG -- 1.8  PHOS -- --   Liver Function Tests:  Lab 12/23/11 0510 12/22/11 0252  AST 14 15  ALT 16 18  ALKPHOS 128* 129*  BILITOT 0.3 0.3  PROT 5.7* 5.6*  ALBUMIN 2.9* 2.9*    Lab 12/21/11 1206  LIPASE 69*  AMYLASE --   CBC:  Lab 12/24/11 0500 12/23/11 0510 12/21/11 1206  WBC 10.1 7.0 --  NEUTROABS -- -- 5.0  HGB 11.7* 11.7* --  HCT 34.9* 36.0* --  MCV 82.9 84.7 --  PLT 219 248 --   Cardiac Enzymes:  Lab 12/22/11 0842 12/22/11 0252 12/21/11 1945  CKTOTAL 45 48 45  CKMB 3.3 3.4 3.1  CKMBINDEX -- -- --  TROPONINI <0.30 <0.30 <0.30   CBG:  Lab 12/25/11 0746 12/24/11 1621 12/24/11 1145 12/24/11 0752 12/24/11 0011  12/23/11 2004  GLUCAP 106* 121* 117* 110* 112* 151*   Hemoglobin A1C:  Lab 12/21/11 1945  HGBA1C 6.8*   Coagulation:  Lab 12/25/11 0619 12/24/11 0500 12/23/11 0510 12/22/11 0252  LABPROT 21.2* 27.4* 34.6* 31.9*  INR 1.80* 2.50* 3.37* 3.03*   Urine Drug Screen: Drugs of Abuse     Component Value Date/Time   LABOPIA POSITIVE* 12/22/2011 0527   COCAINSCRNUR NONE DETECTED 12/22/2011 0527   LABBENZ NONE DETECTED 12/22/2011 0527   AMPHETMU NONE DETECTED 12/22/2011 0527   THCU NONE DETECTED 12/22/2011 0527   LABBARB NONE DETECTED 12/22/2011 0527    Alcohol Level:  Lab 12/21/11 1945  ETH <11   Medications: I have reviewed the patient's current medications. Scheduled Meds:   . aspirin EC  81 mg Oral Daily  . citalopram  20 mg Oral Daily  . Flora-Q  1 capsule Oral Daily  . insulin aspart  0-5 Units Subcutaneous QHS  . insulin aspart  0-9 Units Subcutaneous TID WC  . lisinopril  5 mg Oral Daily  . metoprolol succinate  50 mg Oral Daily  . pantoprazole  40 mg  Oral Q1200  . senna  1 tablet Oral Daily  . simvastatin  20 mg Oral q1800  . sodium chloride  3 mL Intravenous Q12H  . sucralfate  1 g Oral QID  . Tamsulosin HCl  0.4 mg Oral QHS  . warfarin  5 mg Oral ONCE-1800   Continuous Infusions:  PRN Meds:.sodium chloride, acetaminophen, gi cocktail, nitroGLYCERIN, ondansetron (ZOFRAN) IV, ondansetron, polyvinyl alcohol, sodium chloride  Assessment/Plan:  #RUQ pain: Patient tells me he does feel a little better since admission. Still unclear etiology, but not entirely convincing for pancreatitis.  He tolerates his diet, though does have decreased appetite (which is chronic). Patient has had significant GI work up in the past, including negative MCRP. I suspect there is a large psych component, and patient may be suffering from IBS. Patient has been treated with an SSRI, but that does not seem to have helped.  Please see note from 12/23/11 for further detail regarding patient's history.   On 12/23/11, I also made changes to his medications, including discontinuing medications with significant GI side effect profiles. -Cont align, senokot and celexa  #Depression: Patient was on fluoxetine 40mg  daily. Will change to Citalopram 40mg  daily given decreased GI side effect profile. On 12/23/11, pt was started on Citalopram 20mg  to monitor for medication specific effects.  I will increase to 40mg  today.  #Normocytic Anemia: I suspect hemoglobin at admission was not true since last 4 Hb have been stable around 12. Pt did not receive IVF hydration, FOBT (-), no active source of bleeding, mental status is normal.  -Anemia panel to evaluate etiology  #CAD: No active issues, CE (-) x 3, continue ASA 81 & SL NTG prn cp  -Continue ASA 81 and metoprolol   #DM: well managed, no active issues  -cont SSI & CBG ACHS  -will d/c metformin from home med list in setting of GI disturbances   #HTN: well managed, continue lisinopril and metoprolol   #h/o PE: history verified by PCP records, will request coumadin consult by pharmacy, INR subtx today -coumadin today, pharmacy following   #VTE: anticoagulation  #Dispo: pending SNF placement in [place], patient is agreeable to this plan.   LOS: 4 days   KAPADIA, Koralynn Greenspan 12/25/2011, 8:12 AM

## 2011-12-25 NOTE — Progress Notes (Signed)
ANTICOAGULATION CONSULT NOTE - Follow Up Consult  Pharmacy Consult for coumadin Indication: history of PE, L popliteal DVT,    Assessment: 61 yo M admitted with severe abd pain, work-up in process. Pt on Coumadin PTA for hx PE. INR down to 1.8 today after coumadin held 1/16, 1/17, 1/18. He got 5 mg yesterday. .  No bleeding issues noted. CBC stable.  Home dose was 5 mg daily. Goal of Therapy:  INR 2-3   Plan:  Coumadin 5 mg po x 1 dose today, continue daily INR  No Known Allergies  Patient Measurements: Height: 5\' 9"  (175.3 cm) Weight: 185 lb (83.915 kg) IBW/kg (Calculated) : 70.7    Vital Signs: Temp: 97.6 F (36.4 C) (01/20 1306) Temp src: Oral (01/20 1306) BP: 113/61 mmHg (01/20 1306) Pulse Rate: 71  (01/20 1306)  Labs:  Basename 12/25/11 0619 12/24/11 0500 12/23/11 0510  HGB -- 11.7* 11.7*  HCT -- 34.9* 36.0*  PLT -- 219 248  APTT -- -- --  LABPROT 21.2* 27.4* 34.6*  INR 1.80* 2.50* 3.37*  HEPARINUNFRC -- -- --  CREATININE -- -- 0.96  CKTOTAL -- -- --  CKMB -- -- --  TROPONINI -- -- --   Estimated Creatinine Clearance: 79.8 ml/min (by C-G formula based on Cr of 0.96).   Len Childs T 12/25/2011,1:38 PM

## 2011-12-25 NOTE — Progress Notes (Addendum)
1500 12/24/10  Lab personnel informed me that this patient refused to have his lab drawn.  Earlier lab drew blood, the lab personnel understood the pt to say he would only allow 2 tubes to be drawn.  When I approached the pt just now to discuss why he was refusing, he stated he did not tell the lab personnel she could only draw 2 tubes but that he said she could only draw 2 times, so therefore he refused this lab draw.  He stated they drew blood early morning and again at lunch time and now for a third time which he refused.  Resident on call notified and will redraw for in the morning. Bonney Leitz RN

## 2011-12-26 DIAGNOSIS — K859 Acute pancreatitis without necrosis or infection, unspecified: Secondary | ICD-10-CM

## 2011-12-26 LAB — GLUCOSE, CAPILLARY
Glucose-Capillary: 139 mg/dL — ABNORMAL HIGH (ref 70–99)
Glucose-Capillary: 110 mg/dL — ABNORMAL HIGH (ref 70–99)

## 2011-12-26 LAB — PROTIME-INR: INR: 1.96 — ABNORMAL HIGH (ref 0.00–1.49)

## 2011-12-26 LAB — TSH: TSH: 0.818 u[IU]/mL (ref 0.350–4.500)

## 2011-12-26 LAB — VITAMIN D 25 HYDROXY (VIT D DEFICIENCY, FRACTURES): Vit D, 25-Hydroxy: 34 ng/mL (ref 30–89)

## 2011-12-26 MED ORDER — CITALOPRAM HYDROBROMIDE 20 MG PO TABS: 20.0000 mg | ORAL_TABLET | Freq: Every day | ORAL | Status: DC

## 2011-12-26 MED ORDER — CITALOPRAM HYDROBROMIDE 20 MG PO TABS
20.0000 mg | ORAL_TABLET | Freq: Every day | ORAL | Status: DC
  Administered 2011-12-26 – 2011-12-27 (×2): 20 mg via ORAL
  Filled 2011-12-26 (×2): qty 1

## 2011-12-26 MED ORDER — WARFARIN SODIUM 2.5 MG PO TABS
2.5000 mg | ORAL_TABLET | Freq: Once | ORAL | Status: AC
  Administered 2011-12-26: 2.5 mg via ORAL
  Filled 2011-12-26: qty 1
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
## 2011-12-26 NOTE — Progress Notes (Signed)
Patient was seen and examined this morning. No new complaints and no change in physical exam.  Note for today was d/c summary.  No acute issues. Pt is waiting for SNF placement.

## 2011-12-26 NOTE — Progress Notes (Signed)
PT Cancellation Note  Treatment cancelled today due to patient's refusal to participate.  Ian Morton 12/26/2011, 2:23 PM

## 2011-12-26 NOTE — Progress Notes (Deleted)
ANTICOAGULATION CONSULT NOTE - Follow Up Consult  Pharmacy Consult for coumadin Indication: history of PE, L popliteal DVT,    No Known Allergies  Patient Measurements: Height: 5\' 9"  (175.3 cm) Weight: 185 lb (83.915 kg) IBW/kg (Calculated) : 70.7    Vital Signs: Temp: 97.3 F (36.3 C) (01/21 0505) Temp src: Oral (01/21 0505) BP: 122/80 mmHg (01/21 0955) Pulse Rate: 74  (01/21 0955)  Labs:  Basename 12/26/11 0524 12/25/11 0619 12/24/11 0500  HGB -- -- 11.7*  HCT -- -- 34.9*  PLT -- -- 219  APTT -- -- --  LABPROT 22.7* 21.2* 27.4*  INR 1.96* 1.80* 2.50*  HEPARINUNFRC -- -- --  CREATININE -- -- --  CKTOTAL -- -- --  CKMB -- -- --  TROPONINI -- -- --   Estimated Creatinine Clearance: 79.8 ml/min (by C-G formula based on Cr of 0.96).  Assessment: 61 yo M admitted with severe abd pain, work-up in process. Pt on Coumadin PTA for hx PE. INR 1.96 today and noted  coumadin held 1/16, 1/17, 1/18 for INR > 3.0. Home dose was 5 mg daily.  Goal of Therapy:  INR 2-3   Plan:  Coumadin 5 mg po x 1 dose today, continue daily INR  Benny Lennert 12/26/2011,12:14 PM

## 2011-12-26 NOTE — Progress Notes (Signed)
ANTICOAGULATION CONSULT NOTE - Follow Up Consult  Pharmacy Consult for coumadin Indication: history of PE, L popliteal DVT,    Assessment: 61 yo M admitted with severe abd pain, work-up in process. Pt on Coumadin PTA for hx PE, DVT.  Held 01/16-1/18 for supratherapeutic INR.  Pt has now received 2 doses Coumadin. INR 2.5-->1.80-->1.96 FOBT-negative.No bleeding issues noted. Hgb 11.7 from 11/19.  Home dose 5 mg qday UTD - pt unclear of his home regimen.    Goal of Therapy:  INR 2-3   Plan:  Coumadin 2.5mg  po x 1. F/u INR, Hgb  No Known Allergies  Patient Measurements: Height: 5\' 9"  (175.3 cm) Weight: 185 lb (83.915 kg) IBW/kg (Calculated) : 70.7    Vital Signs: Temp: 97.3 F (36.3 C) (01/21 0505) Temp src: Oral (01/21 0505) BP: 122/80 mmHg (01/21 0955) Pulse Rate: 74  (01/21 0955)  Labs:  Ian Morton 12/26/11 0524 12/25/11 0619 12/24/11 0500  HGB -- -- 11.7*  HCT -- -- 34.9*  PLT -- -- 219  APTT -- -- --  LABPROT 22.7* 21.2* 27.4*  INR 1.96* 1.80* 2.50*  HEPARINUNFRC -- -- --  CREATININE -- -- --  CKTOTAL -- -- --  CKMB -- -- --  TROPONINI -- -- --   Estimated Creatinine Clearance: 79.8 ml/min (by C-G formula based on Cr of 0.96).   Ian Morton E 12/26/2011,10:07 AM

## 2011-12-27 LAB — PROTIME-INR
INR: 2.88 — ABNORMAL HIGH (ref 0.00–1.49)
Prothrombin Time: 30.6 seconds — ABNORMAL HIGH (ref 11.6–15.2)

## 2011-12-27 MED ORDER — WARFARIN SODIUM 2.5 MG PO TABS
2.5000 mg | ORAL_TABLET | Freq: Once | ORAL | Status: DC
  Filled 2011-12-27: qty 1

## 2011-12-27 MED ORDER — WARFARIN SODIUM 5 MG PO TABS: 2.5000 mg | ORAL_TABLET | ORAL | Status: DC

## 2011-12-27 NOTE — Progress Notes (Signed)
Physical Therapy Treatment Patient Details Name: Ian Morton MRN: [id number] DOB: 1951/09/04 Today's Date: 12/27/2011  PT Assessment/Plan  PT - Assessment/Plan Comments on Treatment Session: Pt agreeable to ambulation with some encouragement. Pt requiring cues for safety throughout and educated on use of callbell.  PT Plan: Discharge plan remains appropriate PT Frequency: Min 3X/week Follow Up Recommendations: Skilled nursing facility Equipment Recommended: Defer to next venue PT Goals  Acute Rehab PT Goals PT Goal: Supine/Side to Sit - Progress: Met PT Goal: Sit to Stand - Progress: Progressing toward goal PT Goal: Stand - Progress: Progressing toward goal PT Goal: Ambulate - Progress: Progressing toward goal  PT Treatment Precautions/Restrictions  Precautions Precautions: Fall Restrictions Weight Bearing Restrictions: No Mobility (including Balance) Bed Mobility Supine to Sit: 7: Independent Sitting - Scoot to Edge of Bed: 7: Independent Transfers Sit to Stand: 4: Min assist;Other (comment);From bed;With upper extremity assist (MinGuard A) Sit to Stand Details (indicate cue type and reason): Cues for safe technique Stand to Sit: 4: Min assist;To chair/3-in-1;With upper extremity assist;With armrests;Other (comment) (MinGuard A) Stand to Sit Details: Cues for positioning before sitting.  Ambulation/Gait Ambulation/Gait Assistance: 4: Min assist Ambulation/Gait Assistance Details (indicate cue type and reason): A for balance and cues for safety as patient tends to pick up RW.  Ambulation Distance (Feet): 160 Feet Assistive device: Rolling walker Gait Pattern: Trunk flexed;Decreased stride length    Exercise    End of Session PT - End of Session Equipment Utilized During Treatment: Gait belt Activity Tolerance: Patient tolerated treatment well Patient left: in chair;with call bell in reach Nurse Communication: Mobility status for transfers;Mobility status for  ambulation General Behavior During Session: [facility] for tasks performed Cognition: [facility] for tasks performed  Kylinn Shropshire, Adline Potter 12/27/2011, 12:00 PM 12/27/2011 Fredrich Birks PTA [phone] pager [phone] office

## 2011-12-27 NOTE — Progress Notes (Signed)
Subjective: Patient seen and examined.  Still c/o of 9-10/10 pain but no acute pathology noted.  HR wnl. Only had cake and juice for lunch  Objective: Vital signs in last 24 hours: Filed Vitals:   12/26/11 2049 12/27/11 0500 12/27/11 0949 12/27/11 1429  BP: 124/78 128/83 129/81 139/82  Pulse: 75 78 86 81  Temp: 98 F (36.7 C) 98 F (36.7 C)  97.3 F (36.3 C)  TempSrc: Oral Oral    Resp: 19 18  19   Height:      Weight:      SpO2: 99% 96%  95%   Physical Exam: General: resting in bed, NAD HEENT: PERRL, EOMI, no scleral icterus Cardiac: RRR, no rubs, murmurs or gallops Pulm: clear to auscultation bilaterally, moving normal volumes of air Abd: soft, nontender (to deep auscultation), nondistended, obese, BS present Ext: warm and well perfused, no pedal edema Neuro: alert and oriented X3, cranial nerves II-XII grossly intact  Lab Results: Thyroid Function Tests:  Lab 12/26/11 0524  TSH 0.818  T4TOTAL --  FREET4 --  T3FREE --  THYROIDAB --   Coagulation:  Lab 12/27/11 0505 12/26/11 0524 12/25/11 0619 12/24/11 0500  LABPROT 30.6* 22.7* 21.2* 27.4*  INR 2.88* 1.96* 1.80* 2.50*   Vit D, 25-Hydroxy = 34 (snl)  Medications: I have reviewed the patient's current medications. Scheduled Meds:   . aspirin EC  81 mg Oral Daily  . citalopram  20 mg Oral Daily  . Flora-Q  1 capsule Oral Daily  . lisinopril  5 mg Oral Daily  . metoprolol succinate  50 mg Oral Daily  . pantoprazole  40 mg Oral Q1200  . senna  1 tablet Oral Daily  . simvastatin  20 mg Oral q1800  . sodium chloride  3 mL Intravenous Q12H  . sucralfate  1 g Oral QID  . Tamsulosin HCl  0.4 mg Oral QHS  . warfarin  2.5 mg Oral ONCE-1800  . warfarin  2.5 mg Oral ONCE-1800   Continuous Infusions:  PRN Meds:.sodium chloride, acetaminophen, gi cocktail, nitroGLYCERIN, ondansetron (ZOFRAN) IV, ondansetron, polyvinyl alcohol, sodium chloride  Assessment/Plan:  No active issues.  Patient has been stable to d/c to  SNF when bed available.  No organic cause of pain identified.  Pt will likely benefit from PT and psychotherapy, he may have component of PTSD.  Please see d/c summ for further details.  Pt to be d/c to SNF today [facility] in [place]).   LOS: 6 days   KAPADIA, Otilio Groleau 12/27/2011, 2:42 PM

## 2011-12-27 NOTE — Progress Notes (Signed)
Agree with PTA.    Rivky Clendenning, PT [phone]  

## 2011-12-27 NOTE — Progress Notes (Signed)
ANTICOAGULATION CONSULT NOTE - Follow Up Consult  Pharmacy Consult for coumadin Indication: history of PE, L popliteal DVT,    Assessment: 61 yo M admitted with severe abd pain, work-up in process. Pt on Coumadin PTA for hx PE, DVT.   Coumadin PTA, held 01/16-1/18 for supratherapeutic INR. INR today is 2.88. Home dose 5 mg qday UTD - pt unclear of his home regimen. Plan is SNF, possibly today or tomorrow. INR increase may be from 7.5mg  given 12/24/10   Goal of Therapy:  INR 2-3   Plan:  Coumadin 2.5mg  po x 1. F/u INR,   No Known Allergies  Patient Measurements: Height: 5\' 9"  (175.3 cm) Weight: 185 lb (83.915 kg) IBW/kg (Calculated) : 70.7    Vital Signs: Temp: 98 F (36.7 C) (01/22 0500) Temp src: Oral (01/22 0500) BP: 128/83 mmHg (01/22 0500) Pulse Rate: 78  (01/22 0500)  Labs:  Basename 12/27/11 0505 12/26/11 0524 12/25/11 0619  HGB -- -- --  HCT -- -- --  PLT -- -- --  APTT -- -- --  LABPROT 30.6* 22.7* 21.2*  INR 2.88* 1.96* 1.80*  HEPARINUNFRC -- -- --  CREATININE -- -- --  CKTOTAL -- -- --  CKMB -- -- --  TROPONINI -- -- --   Estimated Creatinine Clearance: 79.8 ml/min (by C-G formula based on Cr of 0.96).   Benny Lennert 12/27/2011,9:46 AM

## 2011-12-27 NOTE — Plan of Care (Signed)
Problem: Discharge Progression Outcomes Goal: Pain controlled with appropriate interventions Outcome: Not Applicable Date Met:  12/27/11 Pt continues to c/o significant pain, but no biological cause can be identified.

## 2011-12-27 NOTE — Progress Notes (Signed)
Pt transferring to SNF with skin intact and vitals stable. No IV access to d/c. Pt dressed in his own clothing. To be transferred by [facility] to SNF.

## 2011-12-27 NOTE — Progress Notes (Signed)
Pt has accepted bed at [facility] in [place]. CSW has prepared chart and called [facility], RN advised.   CSW provided pt with resources for hot meals and grocery assistance, as pt states his family has "no money for food." CSW signing off.  Baxter Flattery, MSW [phone]

## 2012-04-14 ENCOUNTER — Encounter (HOSPITAL_BASED_OUTPATIENT_CLINIC_OR_DEPARTMENT_OTHER): Payer: Self-pay | Admitting: Emergency Medicine

## 2012-04-14 ENCOUNTER — Emergency Department (HOSPITAL_BASED_OUTPATIENT_CLINIC_OR_DEPARTMENT_OTHER)
Admission: EM | Admit: 2012-04-14 | Discharge: 2012-04-14 | Disposition: A | Discharge status: Home / Self Care | Payer: Medicaid Other | Attending: Emergency Medicine | Admitting: Emergency Medicine

## 2012-04-14 DIAGNOSIS — E785 Hyperlipidemia, unspecified: Secondary | ICD-10-CM | POA: Insufficient documentation

## 2012-04-14 DIAGNOSIS — I252 Old myocardial infarction: Secondary | ICD-10-CM | POA: Insufficient documentation

## 2012-04-14 DIAGNOSIS — E119 Type 2 diabetes mellitus without complications: Secondary | ICD-10-CM | POA: Insufficient documentation

## 2012-04-14 DIAGNOSIS — I10 Essential (primary) hypertension: Secondary | ICD-10-CM | POA: Insufficient documentation

## 2012-04-14 DIAGNOSIS — H9211 Otorrhea, right ear: Secondary | ICD-10-CM

## 2012-04-14 DIAGNOSIS — H9210 Otorrhea, unspecified ear: Secondary | ICD-10-CM | POA: Insufficient documentation

## 2012-04-14 LAB — CBC
Hemoglobin: 13.7 g/dL (ref 13.0–17.0)
MCHC: 34.3 g/dL (ref 30.0–36.0)
RBC: 4.8 MIL/uL (ref 4.22–5.81)

## 2012-04-14 LAB — DIFFERENTIAL
Basophils Relative: 1 % (ref 0–1)
Monocytes Relative: 10 % (ref 3–12)
Neutro Abs: 3.8 10*3/uL (ref 1.7–7.7)
Neutrophils Relative %: 53 % (ref 43–77)

## 2012-04-14 LAB — PROTIME-INR: INR: 0.97 (ref 0.00–1.49)

## 2012-04-14 MED ORDER — CIPROFLOXACIN-DEXAMETHASONE 0.3-0.1 % OT SUSP
4.0000 [drp] | Freq: Once | OTIC | Status: AC
  Administered 2012-04-14: 4 [drp] via OTIC
  Filled 2012-04-14: qty 7.5

## 2012-04-14 NOTE — Discharge Instructions (Signed)
Please put 4 Ciprodex drops in your right ear twice a day for 7 days. If you are still having pain or drainage from her right year in 4-5 days please make an appointment to followup with an ear nose and throat doctor. Draining Ear Ear wax, pus, blood and other fluids are examples of the different types of drainage from ears. Drops or cream may be needed to lessen the itching which may occur with ear drainage. CAUSES   Skin irritations in the ear.   Ear infection.   Swimmer's ear.   Ruptured eardrum.   Foreign object in the ear canal.   Sudden pressure changes.   Head injury.  HOME CARE INSTRUCTIONS   Only take over-the-counter or prescription medicines for pain, fever, or discomfort as directed by your caregiver.   Do not rub the ear canal with cotton-tipped swabs.   Do not swim until your caregiver says it is okay.   Before you take a shower, cover a cotton ball with petroleum jelly to keep water out.   Limit exposure to smoke. Secondhand smoke can increase the chance for ear infections.   Keep up with immunizations.   Wash your hands well.   Keep all follow-up appointments to examine the ear and evaluate hearing.  SEEK MEDICAL CARE IF:   You have increased drainage.   You have ear pain, a fever, or drainage that is not getting better after 48 hours of antibiotics.   You are unusually tired.  SEEK IMMEDIATE MEDICAL CARE IF:  You have severe ear pain or headache.   The patient is older than 3 months with a rectal or oral temperature of 102 F (38.9 C) or higher.   The patient is 26 months old or younger with a rectal temperature of 100.4 F (38 C) or higher.   You vomit.   You feel dizzy.   You have a seizure.   You have new hearing loss.  MAKE SURE YOU:   Understand these instructions.   Will watch your condition.   Will get help right away if you are not doing well or get worse.  Document Released: 11/21/2005 Document Revised: 11/10/2011 Document  Reviewed: 09/24/2009 [facility] Patient Information 2012 [place], [place].

## 2012-04-14 NOTE — ED Notes (Signed)
Cab voucher provided to [address] [employer]

## 2012-04-14 NOTE — ED Notes (Signed)
Pt to ED via EMS w/ c/o bleeding from RT ear- no known injury or pain
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
## 2012-05-24 ENCOUNTER — Emergency Department (HOSPITAL_BASED_OUTPATIENT_CLINIC_OR_DEPARTMENT_OTHER): Payer: Medicaid Other

## 2012-05-24 ENCOUNTER — Encounter (HOSPITAL_BASED_OUTPATIENT_CLINIC_OR_DEPARTMENT_OTHER): Payer: Self-pay | Admitting: *Deleted

## 2012-05-24 ENCOUNTER — Emergency Department (HOSPITAL_BASED_OUTPATIENT_CLINIC_OR_DEPARTMENT_OTHER)
Admission: EM | Admit: 2012-05-24 | Discharge: 2012-05-25 | Disposition: A | Discharge status: Home / Self Care | Payer: Medicaid Other | Attending: Emergency Medicine | Admitting: Emergency Medicine

## 2012-05-24 DIAGNOSIS — I10 Essential (primary) hypertension: Secondary | ICD-10-CM | POA: Insufficient documentation

## 2012-05-24 DIAGNOSIS — E785 Hyperlipidemia, unspecified: Secondary | ICD-10-CM | POA: Insufficient documentation

## 2012-05-24 DIAGNOSIS — E119 Type 2 diabetes mellitus without complications: Secondary | ICD-10-CM | POA: Insufficient documentation

## 2012-05-24 DIAGNOSIS — Z79899 Other long term (current) drug therapy: Secondary | ICD-10-CM | POA: Insufficient documentation

## 2012-05-24 DIAGNOSIS — H6120 Impacted cerumen, unspecified ear: Secondary | ICD-10-CM | POA: Insufficient documentation

## 2012-05-24 DIAGNOSIS — R1011 Right upper quadrant pain: Secondary | ICD-10-CM | POA: Insufficient documentation

## 2012-05-24 DIAGNOSIS — I252 Old myocardial infarction: Secondary | ICD-10-CM | POA: Insufficient documentation

## 2012-05-24 DIAGNOSIS — R109 Unspecified abdominal pain: Secondary | ICD-10-CM

## 2012-05-24 DIAGNOSIS — Z7901 Long term (current) use of anticoagulants: Secondary | ICD-10-CM | POA: Insufficient documentation

## 2012-05-24 DIAGNOSIS — Z86711 Personal history of pulmonary embolism: Secondary | ICD-10-CM | POA: Insufficient documentation

## 2012-05-24 DIAGNOSIS — R51 Headache: Secondary | ICD-10-CM | POA: Insufficient documentation

## 2012-05-24 LAB — DIFFERENTIAL
Lymphocytes Relative: 29 % (ref 12–46)
Lymphs Abs: 2.7 10*3/uL (ref 0.7–4.0)
Monocytes Relative: 8 % (ref 3–12)
Neutrophils Relative %: 60 % (ref 43–77)

## 2012-05-24 LAB — CBC
Hemoglobin: 14.2 g/dL (ref 13.0–17.0)
MCH: 29 pg (ref 26.0–34.0)
Platelets: 194 10*3/uL (ref 150–400)
RBC: 4.9 MIL/uL (ref 4.22–5.81)
WBC: 9.3 10*3/uL (ref 4.0–10.5)

## 2012-05-24 LAB — COMPREHENSIVE METABOLIC PANEL
ALT: 20 U/L (ref 0–53)
Alkaline Phosphatase: 70 U/L (ref 39–117)
BUN: 13 mg/dL (ref 6–23)
CO2: 30 mEq/L (ref 19–32)
Chloride: 103 mEq/L (ref 96–112)
GFR calc Af Amer: >90 mL/min (ref >90)
GFR calc non Af Amer: 89 mL/min — ABNORMAL LOW (ref >90)
Glucose, Bld: 108 mg/dL — ABNORMAL HIGH (ref 70–99)
Potassium: 4.1 mEq/L (ref 3.5–5.1)
Sodium: 139 mEq/L (ref 135–145)
Total Bilirubin: 0.2 mg/dL — ABNORMAL LOW (ref 0.3–1.2)

## 2012-05-24 LAB — PROTIME-INR: INR: 0.91 (ref 0.00–1.49)

## 2012-05-24 MED ORDER — SODIUM CHLORIDE 0.9 % IV SOLN
1000.0000 mL | Freq: Once | INTRAVENOUS | Status: AC
  Administered 2012-05-24: 1000 mL via INTRAVENOUS

## 2012-05-24 MED ORDER — SODIUM CHLORIDE 0.9 % IV SOLN
1000.0000 mL | INTRAVENOUS | Status: DC
  Administered 2012-05-25: 1000 mL via INTRAVENOUS

## 2012-05-24 MED ORDER — ONDANSETRON HCL 4 MG/2ML IJ SOLN
4.0000 mg | Freq: Once | INTRAMUSCULAR | Status: AC
  Administered 2012-05-24: 4 mg via INTRAVENOUS
  Filled 2012-05-24: qty 2

## 2012-05-24 MED ORDER — HYDROMORPHONE HCL PF 1 MG/ML IJ SOLN
1.0000 mg | Freq: Once | INTRAMUSCULAR | Status: AC
  Administered 2012-05-24: 1 mg via INTRAVENOUS
  Filled 2012-05-24: qty 1
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
## 2012-05-24 NOTE — ED Notes (Signed)
Patient transported to X-ray 

## 2012-05-24 NOTE — ED Notes (Signed)
Pt. Reports he has had some constipation with a bout of diarrhea today.  Pt. Reports headache at 1300 today while resting.
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
## 2012-05-24 NOTE — ED Notes (Signed)
Pt c/o abd pain x 3 hours, no fever, no N/V. Pt also c/o ear pain and headache

## 2012-05-25 ENCOUNTER — Emergency Department (HOSPITAL_BASED_OUTPATIENT_CLINIC_OR_DEPARTMENT_OTHER): Payer: Medicaid Other

## 2012-05-25 LAB — URINALYSIS, ROUTINE W REFLEX MICROSCOPIC
Bilirubin Urine: NEGATIVE
Ketones, ur: NEGATIVE mg/dL
Nitrite: NEGATIVE
Protein, ur: NEGATIVE mg/dL
Urobilinogen, UA: 0.2 mg/dL (ref 0.0–1.0)
pH: 6.5 (ref 5.0–8.0)

## 2012-05-25 MED ORDER — IOHEXOL 300 MG/ML  SOLN
100.0000 mL | Freq: Once | INTRAMUSCULAR | Status: AC | PRN
  Administered 2012-05-25: 100 mL via INTRAVENOUS

## 2012-05-25 MED ORDER — HYDROMORPHONE HCL PF 1 MG/ML IJ SOLN
1.0000 mg | Freq: Once | INTRAMUSCULAR | Status: AC
  Administered 2012-05-25: 1 mg via INTRAVENOUS
  Filled 2012-05-25: qty 1

## 2012-05-25 MED ORDER — IOHEXOL 300 MG/ML  SOLN
20.0000 mL | Freq: Once | INTRAMUSCULAR | Status: AC | PRN
  Administered 2012-05-25: 20 mL via ORAL

## 2012-05-25 NOTE — ED Notes (Signed)
Pt drinking CT contrast. IV fluids infusing without diff, IV site unremarkable.

## 2012-05-25 NOTE — ED Notes (Addendum)
Pt given cab voucher for transport home. Pt advised to have transportation home for future visits to this facility due to him receiving narcotic pain meds. Pt understands request and agrees with plan.

## 2012-05-25 NOTE — Discharge Instructions (Signed)

## 2012-05-25 NOTE — ED Notes (Signed)
Patient transported to CT 

## 2012-11-30 ENCOUNTER — Other Ambulatory Visit: Payer: Self-pay | Admitting: Neurology

## 2012-11-30 DIAGNOSIS — S339XXA Sprain of unspecified parts of lumbar spine and pelvis, initial encounter: Secondary | ICD-10-CM

## 2012-11-30 DIAGNOSIS — R42 Dizziness and giddiness: Secondary | ICD-10-CM

## 2012-11-30 DIAGNOSIS — S139XXA Sprain of joints and ligaments of unspecified parts of neck, initial encounter: Secondary | ICD-10-CM

## 2012-12-07 ENCOUNTER — Ambulatory Visit
Admission: RE | Admit: 2012-12-07 | Discharge: 2012-12-07 | Disposition: A | Discharge status: Home / Self Care | Payer: Medicaid Other | Source: Ambulatory Visit | Attending: Neurology | Admitting: Neurology

## 2012-12-07 ENCOUNTER — Other Ambulatory Visit: Payer: Medicaid Other

## 2012-12-07 DIAGNOSIS — R42 Dizziness and giddiness: Secondary | ICD-10-CM

## 2012-12-07 DIAGNOSIS — S139XXA Sprain of joints and ligaments of unspecified parts of neck, initial encounter: Secondary | ICD-10-CM

## 2013-01-22 ENCOUNTER — Encounter: Payer: Self-pay | Admitting: Neurology

## 2013-01-22 DIAGNOSIS — S139XXA Sprain of joints and ligaments of unspecified parts of neck, initial encounter: Secondary | ICD-10-CM | POA: Insufficient documentation

## 2013-01-22 DIAGNOSIS — S339XXA Sprain of unspecified parts of lumbar spine and pelvis, initial encounter: Secondary | ICD-10-CM | POA: Insufficient documentation

## 2013-02-20 ENCOUNTER — Encounter: Payer: Self-pay | Admitting: Podiatry

## 2013-02-20 ENCOUNTER — Ambulatory Visit (INDEPENDENT_AMBULATORY_CARE_PROVIDER_SITE_OTHER): Payer: Medicaid Other | Admitting: Podiatry

## 2013-02-20 VITALS — BP 126/65 | HR 60 | Ht 69.0 in | Wt 183.0 lb

## 2013-02-20 DIAGNOSIS — M25579 Pain in unspecified ankle and joints of unspecified foot: Secondary | ICD-10-CM | POA: Insufficient documentation

## 2013-02-20 DIAGNOSIS — L738 Other specified follicular disorders: Secondary | ICD-10-CM

## 2013-02-20 DIAGNOSIS — I739 Peripheral vascular disease, unspecified: Secondary | ICD-10-CM

## 2013-02-20 DIAGNOSIS — L853 Xerosis cutis: Secondary | ICD-10-CM | POA: Insufficient documentation

## 2013-02-20 MED ORDER — AMMONIUM LACTATE 12 % EX CREA: TOPICAL_CREAM | CUTANEOUS | Status: DC | PRN

## 2013-02-20 NOTE — Patient Instructions (Addendum)
During each shower, scrub both heels and dry well. Then, apply Lac Hydrin lotion on all dry skin area twice daily. Return in 2 months for follow up.

## 2013-02-20 NOTE — Progress Notes (Signed)
  Subjective:     Jaxxen Voong is an 62 y.o. male who presents for follow up visit.  Previous visit revealed ischemic foot pain. He was prescribed Trental 400 mg tid. Stated that the medication seem to help ease the pain on feet.  Review of Systems A comprehensive review of systems was negative except for: Diabetic condition and foot pain.     Objective: O  normal exam, no swelling, tenderness, instability; ligaments intact, full range of motion of all ankle/foot joints Vascular: Posterior tibial pulses are both palpable. Dorsalis pedis is weak and still palpable.  Capillary filling times are over 5 second on all digits. Dermatologic:  Positive of dry cracking skin with thick layers of hard keratosis both heels. Color is mildly purple and cyanotic on all digits. Left foot all nails are discolored.  Neurologic: Deep tendon refexes are within normal. Sensory perceptions are decreased in tactile and vibratory, and sharp and dull discriminatory.   Assessment:  Type II NIDDM controlled.  Microangiopathy both feet.    Plan:  Continue on Trental to improve circulation. Rx. Lac Hydrin lotion for dry skin.   Follow up in 2 month and as needed.

## 2013-02-21 ENCOUNTER — Encounter: Payer: Self-pay | Admitting: *Deleted

## 2013-03-01 ENCOUNTER — Ambulatory Visit (INDEPENDENT_AMBULATORY_CARE_PROVIDER_SITE_OTHER): Payer: Medicaid Other | Admitting: Neurology

## 2013-03-01 ENCOUNTER — Encounter: Payer: Self-pay | Admitting: Neurology

## 2013-03-01 VITALS — BP 116/73 | HR 68 | Ht 69.25 in | Wt 176.0 lb

## 2013-03-01 DIAGNOSIS — S139XXA Sprain of joints and ligaments of unspecified parts of neck, initial encounter: Secondary | ICD-10-CM

## 2013-03-01 DIAGNOSIS — S139XXD Sprain of joints and ligaments of unspecified parts of neck, subsequent encounter: Secondary | ICD-10-CM

## 2013-03-01 DIAGNOSIS — Z5189 Encounter for other specified aftercare: Secondary | ICD-10-CM

## 2013-03-01 MED ORDER — PREGABALIN 75 MG PO CAPS: 75.0000 mg | ORAL_CAPSULE | Freq: Two times a day (BID) | ORAL | Status: DC

## 2013-03-01 NOTE — Patient Instructions (Signed)
  Continue regular stretching for the back and the neck.
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
## 2013-04-22 ENCOUNTER — Ambulatory Visit (INDEPENDENT_AMBULATORY_CARE_PROVIDER_SITE_OTHER): Payer: Medicaid Other | Admitting: Podiatry

## 2013-04-22 ENCOUNTER — Encounter: Payer: Self-pay | Admitting: Podiatry

## 2013-04-22 VITALS — BP 119/68 | HR 60

## 2013-04-22 DIAGNOSIS — B353 Tinea pedis: Secondary | ICD-10-CM | POA: Insufficient documentation

## 2013-04-22 DIAGNOSIS — E1140 Type 2 diabetes mellitus with diabetic neuropathy, unspecified: Secondary | ICD-10-CM

## 2013-04-22 DIAGNOSIS — M25579 Pain in unspecified ankle and joints of unspecified foot: Secondary | ICD-10-CM

## 2013-04-22 DIAGNOSIS — E1142 Type 2 diabetes mellitus with diabetic polyneuropathy: Secondary | ICD-10-CM

## 2013-04-22 DIAGNOSIS — B351 Tinea unguium: Secondary | ICD-10-CM

## 2013-04-22 DIAGNOSIS — M79609 Pain in unspecified limb: Secondary | ICD-10-CM

## 2013-04-22 DIAGNOSIS — E1149 Type 2 diabetes mellitus with other diabetic neurological complication: Secondary | ICD-10-CM

## 2013-04-22 MED ORDER — CLOTRIMAZOLE 1 % EX CREA: TOPICAL_CREAM | Freq: Two times a day (BID) | CUTANEOUS | Status: DC

## 2013-04-22 NOTE — Patient Instructions (Addendum)
Seen for dry skin, numbness and cold feet. Prescribed Lotrimin cream. Please scrub both feet at the end of each shower and apply the cream. Return as needed.

## 2013-04-22 NOTE — Progress Notes (Signed)
Subjective: 62 y.o. year old male patient presents complaining of feet are numb and cold, blood is not circulating and swelling of top of both feet. Does not know what the blood sugar level is. Stated that too tired to check on it.   Review of Systems - General ROS: negative for - chills, fever, night sweats, sleep disturbance, weight gain or weight loss Ophthalmic ROS: Poor vision. ENT ROS: Throat feels something stuck and not coming out. Allergy and Immunology ROS: negative Endocrine ROS: negative Respiratory ROS: Having difficulty breathing at times. Taking inhalor. Cardiovascular ROS: no chest pain or dyspnea on exertion Gastrointestinal ROS: no abdominal pain, change in bowel habits, or black or bloody stools Genito-Urinary ROS: Having frequent urination. Musculoskeletal ROS: Pain in muscles and joints all over. Neurological ROS: Feel tired, pain in body, numbness on feet, seeing several doctors. Dermatological ROS: Dry and flaky heel not responding to moisturizing lotion.   Objective: Dermatologic:   Dry peeling skin in patches over dorsum and plantar bilateral. Thick and cracking heel callus right. Vascular: Dorsalis pedis arteries not palpable, but both posterior tibial arteries are palpable. Orthopedic: Hallux valgus with bunion bilateral. Neurologic: Decreased response to Monofilament sensory testing and Vibratory testing on right > left.  Assessment: Dystrophic mycotic nails x 10. Tinea Pedis bilateral.  Peripheral Neuropathy. Microangiopathy both feet. Type II NIDD  Treatment: Reviewed clinical findings.  Rx. Lamisil cream for Tinea pedis sent.  Return in 3 months or as needed.

## 2013-07-25 ENCOUNTER — Other Ambulatory Visit: Payer: Self-pay | Admitting: Podiatry

## 2013-08-23 ENCOUNTER — Ambulatory Visit (INDEPENDENT_AMBULATORY_CARE_PROVIDER_SITE_OTHER): Payer: Medicaid Other | Admitting: Podiatry

## 2013-08-23 ENCOUNTER — Encounter: Payer: Self-pay | Admitting: Podiatry

## 2013-08-23 VITALS — BP 130/84 | HR 62

## 2013-08-23 DIAGNOSIS — IMO0002 Reserved for concepts with insufficient information to code with codable children: Secondary | ICD-10-CM

## 2013-08-23 DIAGNOSIS — B351 Tinea unguium: Secondary | ICD-10-CM

## 2013-08-23 DIAGNOSIS — L853 Xerosis cutis: Secondary | ICD-10-CM

## 2013-08-23 DIAGNOSIS — B353 Tinea pedis: Secondary | ICD-10-CM

## 2013-08-23 MED ORDER — CLOTRIMAZOLE-BETAMETHASONE 1-0.05 % EX CREA: TOPICAL_CREAM | Freq: Two times a day (BID) | CUTANEOUS | Status: DC

## 2013-08-23 NOTE — Patient Instructions (Addendum)
Seen for dry skin and bumps in skin. Will try different antifungal cream as well as moisturizing cream. Use prescription cream as directed. For dry skin may use sample dispensed.

## 2013-08-23 NOTE — Progress Notes (Signed)
62 year old male presents complaining of dry skin and bump in skin on left foot. The area is itching. He is using Lotrimin but has not improved.  Also with swollen left ankle and leg on both.  Stated that medication for blood is helping (Trental 400mg  tid).  Objective: Dry peeling skin with red bumps on medial heel left foot. No edema or blisters noted. No new lesions noted.  Assessment: Tinea pedis not resolved.  Plan: Will try different antifungal cream as well as moisturizing cream. Rx. Lotrisone cream. Amlactin sample cream dispensed for dry skin.  Return as needed.

## 2013-09-02 ENCOUNTER — Ambulatory Visit (INDEPENDENT_AMBULATORY_CARE_PROVIDER_SITE_OTHER): Payer: Medicaid Other | Admitting: Nurse Practitioner

## 2013-09-02 ENCOUNTER — Encounter: Payer: Self-pay | Admitting: Nurse Practitioner

## 2013-09-02 VITALS — BP 127/76 | HR 60 | Ht 69.0 in | Wt 186.0 lb

## 2013-09-02 DIAGNOSIS — R413 Other amnesia: Secondary | ICD-10-CM

## 2013-09-02 DIAGNOSIS — S139XXD Sprain of joints and ligaments of unspecified parts of neck, subsequent encounter: Secondary | ICD-10-CM

## 2013-09-02 DIAGNOSIS — Z5189 Encounter for other specified aftercare: Secondary | ICD-10-CM
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
## 2013-09-02 NOTE — Progress Notes (Signed)
I have read the note, and I agree with the clinical assessment and plan.  Ian Morton,Ian Morton   

## 2013-09-02 NOTE — Patient Instructions (Addendum)
Check some labs today Continue Lyrica at current dose will refill Followup in 6 months

## 2013-09-03 ENCOUNTER — Other Ambulatory Visit: Payer: Self-pay | Admitting: Nurse Practitioner

## 2013-09-03 LAB — DEMENTIA PANEL: Vitamin B-12: 168 pg/mL — ABNORMAL LOW (ref 211–946)

## 2013-09-03 MED ORDER — CYANOCOBALAMIN 1000 MCG/ML IJ SOLN: INTRAMUSCULAR | Status: DC

## 2013-09-04 ENCOUNTER — Telehealth: Payer: Self-pay | Admitting: *Deleted

## 2013-09-04 NOTE — Progress Notes (Signed)
Quick Note:  I called and spoke to wife and gave her the results and reccomendations of C.Martin/NP. She asked about going to pcp for injections. I would call and see if that possible and then would let her know. She would relay this to her husband. ______

## 2013-09-04 NOTE — Telephone Encounter (Signed)
LMVM for Dr. Julio Sicks 's nurse about getting pt in their office for B 12 injections since their office closer.  Please return call. Thanks

## 2013-09-05 NOTE — Telephone Encounter (Signed)
I spoke to pt and  I relayed the lab results and need for IM injections of B12.  Pt has transportation and family issues that require if at all possible to have done in [place], [place]. I LMVM with Dr. Julio Sicks nurse re: this.   Awaiting call back.

## 2013-09-06 ENCOUNTER — Telehealth: Payer: Self-pay | Admitting: Neurology

## 2013-09-06 NOTE — Telephone Encounter (Signed)
I received call back from pcp about B12 injections.   They can give.   I faxed the results to them along with ofv note and prescription directions.  I called pt but no answer. Could not LM.

## 2013-09-09 ENCOUNTER — Other Ambulatory Visit: Payer: Self-pay | Admitting: Podiatry

## 2013-09-10 NOTE — Telephone Encounter (Signed)
I called and spoke to pt and relayed the instructions for B 12 injections ( IM weekly for 4 wks then afterwards to take OTC B12 supplement).  I faxed to [facility] (ofv notes, and order for the B12).  With confirmation.  [id number], [phone]

## 2013-10-17 ENCOUNTER — Other Ambulatory Visit: Payer: Self-pay | Admitting: Podiatry

## 2013-11-05 ENCOUNTER — Encounter: Payer: Self-pay | Admitting: Critical Care Medicine

## 2013-11-05 ENCOUNTER — Ambulatory Visit (INDEPENDENT_AMBULATORY_CARE_PROVIDER_SITE_OTHER): Payer: Medicaid Other | Admitting: Critical Care Medicine

## 2013-11-05 VITALS — BP 120/80 | HR 60 | Ht 70.0 in | Wt 191.0 lb

## 2013-11-05 DIAGNOSIS — I2699 Other pulmonary embolism without acute cor pulmonale: Secondary | ICD-10-CM

## 2013-11-05 DIAGNOSIS — I219 Acute myocardial infarction, unspecified: Secondary | ICD-10-CM | POA: Insufficient documentation

## 2013-11-05 DIAGNOSIS — J984 Other disorders of lung: Secondary | ICD-10-CM

## 2013-11-05 DIAGNOSIS — R0600 Dyspnea, unspecified: Secondary | ICD-10-CM | POA: Insufficient documentation

## 2013-11-05 DIAGNOSIS — I2510 Atherosclerotic heart disease of native coronary artery without angina pectoris: Secondary | ICD-10-CM | POA: Insufficient documentation

## 2013-11-05 DIAGNOSIS — R0609 Other forms of dyspnea: Secondary | ICD-10-CM

## 2013-11-05 DIAGNOSIS — S065X9A Traumatic subdural hemorrhage with loss of consciousness of unspecified duration, initial encounter: Secondary | ICD-10-CM | POA: Insufficient documentation

## 2013-11-05 DIAGNOSIS — E785 Hyperlipidemia, unspecified: Secondary | ICD-10-CM | POA: Insufficient documentation

## 2013-11-05 DIAGNOSIS — K219 Gastro-esophageal reflux disease without esophagitis: Secondary | ICD-10-CM | POA: Insufficient documentation

## 2013-11-05 NOTE — Patient Instructions (Signed)
Use spacer with symbicort and proair Stay on symbicort two puff twice daily A CT Chest will be obtained A pulmonary function study will be obtained Return after above studies obtained
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
## 2013-11-06 ENCOUNTER — Other Ambulatory Visit (HOSPITAL_BASED_OUTPATIENT_CLINIC_OR_DEPARTMENT_OTHER): Payer: Medicaid Other

## 2013-11-06 ENCOUNTER — Encounter: Payer: Self-pay | Admitting: Critical Care Medicine

## 2013-11-06 NOTE — Assessment & Plan Note (Signed)
Dyspnea with restrictive defect on prior pfts. Poor HFA technique. Plan Use spacer with symbicort and proair Stay on symbicort two puff twice daily A CT Chest will be obtained A pulmonary function study will be obtained Return after above studies obtained

## 2013-11-08 ENCOUNTER — Other Ambulatory Visit: Payer: Self-pay | Admitting: Podiatry

## 2013-11-08 ENCOUNTER — Ambulatory Visit (HOSPITAL_BASED_OUTPATIENT_CLINIC_OR_DEPARTMENT_OTHER)
Admission: RE | Admit: 2013-11-08 | Discharge: 2013-11-08 | Disposition: A | Discharge status: Home / Self Care | Payer: Medicaid Other | Source: Ambulatory Visit | Attending: Critical Care Medicine | Admitting: Critical Care Medicine

## 2013-11-08 DIAGNOSIS — R0600 Dyspnea, unspecified: Secondary | ICD-10-CM

## 2013-11-08 DIAGNOSIS — I2510 Atherosclerotic heart disease of native coronary artery without angina pectoris: Secondary | ICD-10-CM | POA: Insufficient documentation

## 2013-11-08 DIAGNOSIS — J479 Bronchiectasis, uncomplicated: Secondary | ICD-10-CM | POA: Insufficient documentation

## 2013-11-08 DIAGNOSIS — J984 Other disorders of lung: Secondary | ICD-10-CM

## 2013-11-08 DIAGNOSIS — I700 Atherosclerosis of aorta: Secondary | ICD-10-CM | POA: Insufficient documentation

## 2013-11-08 NOTE — Progress Notes (Signed)
Quick Note:  ATC pt - line rang several times with no answer and no option to leave msg. WCB ______

## 2013-11-11 NOTE — Progress Notes (Signed)
Quick Note:  Called, spoke with pt's wife per pt's request. Informed her of CT Chest results and recs per Dr. Delford Field. She verbalized understanding and will inform pt. ______

## 2013-11-13 ENCOUNTER — Other Ambulatory Visit: Payer: Self-pay | Admitting: Podiatry

## 2014-01-27 ENCOUNTER — Encounter: Payer: Self-pay | Admitting: Podiatry

## 2014-01-27 ENCOUNTER — Ambulatory Visit (INDEPENDENT_AMBULATORY_CARE_PROVIDER_SITE_OTHER): Payer: Medicaid Other | Admitting: Podiatry

## 2014-01-27 VITALS — BP 145/81 | HR 59 | Ht 69.0 in | Wt 191.0 lb

## 2014-01-27 DIAGNOSIS — M25579 Pain in unspecified ankle and joints of unspecified foot: Secondary | ICD-10-CM

## 2014-01-27 DIAGNOSIS — L089 Local infection of the skin and subcutaneous tissue, unspecified: Secondary | ICD-10-CM | POA: Insufficient documentation

## 2014-01-27 DIAGNOSIS — IMO0002 Reserved for concepts with insufficient information to code with codable children: Secondary | ICD-10-CM

## 2014-01-27 DIAGNOSIS — L539 Erythematous condition, unspecified: Secondary | ICD-10-CM | POA: Insufficient documentation

## 2014-01-27 DIAGNOSIS — R234 Changes in skin texture: Secondary | ICD-10-CM | POA: Insufficient documentation

## 2014-01-27 DIAGNOSIS — L988 Other specified disorders of the skin and subcutaneous tissue: Secondary | ICD-10-CM

## 2014-01-27 DIAGNOSIS — M79609 Pain in unspecified limb: Secondary | ICD-10-CM

## 2014-01-27 NOTE — Progress Notes (Signed)
Subjective: 63 year old male presents complaining of painful swollen and red toe 4th digit right x 2 weeks and fissured heel on both feet.  He is using skin cream as prescribed.  Stated that medication for blood is helping (Trental 400mg  tid).   Objective: Dry fissured heel with build up callus plantar posterior aspect of both feet. No inflammation associated with fissured heel.  There is dark red distal end of 4th digit with erythema and mild edema that hurts.  No drainage or open skin on 4th digit. All pedal pulses were faintly palpable.  Radiographic examination of right foot reveal   Assessment: Fissured heel with callus. Inflamed 4th digit distal phalanx.  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   Plan:  Debrided fissured and callused heel bilateral. Reviewed clinical findings and treatment options. Return as needed.

## 2014-01-27 NOTE — Patient Instructions (Signed)
Seen for painful feet. Fissured heels debrided. Inflamed right 4th toe is due to poor circulation. Take Trental as prescribed. Return as needed.

## 2014-02-06 ENCOUNTER — Other Ambulatory Visit: Payer: Self-pay | Admitting: Podiatry

## 2014-03-03 ENCOUNTER — Ambulatory Visit: Payer: Medicaid Other | Admitting: Nurse Practitioner

## 2014-04-02 ENCOUNTER — Other Ambulatory Visit: Payer: Self-pay | Admitting: Podiatry

## 2014-04-04 ENCOUNTER — Ambulatory Visit (INDEPENDENT_AMBULATORY_CARE_PROVIDER_SITE_OTHER): Payer: Medicaid Other | Admitting: Nurse Practitioner

## 2014-04-04 ENCOUNTER — Encounter (INDEPENDENT_AMBULATORY_CARE_PROVIDER_SITE_OTHER): Payer: Self-pay

## 2014-04-04 ENCOUNTER — Encounter: Payer: Self-pay | Admitting: Nurse Practitioner

## 2014-04-04 ENCOUNTER — Ambulatory Visit (INDEPENDENT_AMBULATORY_CARE_PROVIDER_SITE_OTHER): Payer: Self-pay | Admitting: *Deleted

## 2014-04-04 VITALS — BP 149/91 | HR 78 | Ht 69.0 in | Wt 202.0 lb

## 2014-04-04 DIAGNOSIS — R413 Other amnesia: Secondary | ICD-10-CM

## 2014-04-04 DIAGNOSIS — E538 Deficiency of other specified B group vitamins: Secondary | ICD-10-CM

## 2014-04-04 MED ORDER — CYANOCOBALAMIN 1000 MCG/ML IJ SOLN
1000.0000 ug | Freq: Once | INTRAMUSCULAR | Status: AC
  Administered 2014-04-04: 1000 ug via INTRAMUSCULAR

## 2014-04-04 NOTE — Patient Instructions (Signed)
Will give Vitamin B 12 injection today Obtain oral Vit B12 and take daily this is over the counter Will check Vit B12 level in 3 months after being on oral med F/U in 6 months

## 2014-04-04 NOTE — Progress Notes (Signed)
Pt here for B 12 injection.  Under aseptic technique cyanocobalamin 1000mcg/1ml IM given R deltoid.  Tolerated well.  Bandaid applied.  
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
## 2014-04-04 NOTE — Patient Instructions (Addendum)
Pt awaiting transportation.

## 2014-04-04 NOTE — Progress Notes (Signed)
I have read the note, and I agree with the clinical assessment and plan.  Ian Morton K Delmer Kowalski   

## 2014-04-17 ENCOUNTER — Encounter: Payer: Self-pay | Admitting: Nurse Practitioner

## 2014-04-21 NOTE — Telephone Encounter (Signed)
This encounter was created in error - please disregard.

## 2014-04-22 ENCOUNTER — Encounter (HOSPITAL_BASED_OUTPATIENT_CLINIC_OR_DEPARTMENT_OTHER): Payer: Self-pay | Admitting: Emergency Medicine

## 2014-04-22 ENCOUNTER — Encounter: Payer: Self-pay | Admitting: *Deleted

## 2014-04-22 ENCOUNTER — Emergency Department (HOSPITAL_BASED_OUTPATIENT_CLINIC_OR_DEPARTMENT_OTHER): Payer: Medicare Other

## 2014-04-22 ENCOUNTER — Emergency Department (HOSPITAL_BASED_OUTPATIENT_CLINIC_OR_DEPARTMENT_OTHER)
Admission: EM | Admit: 2014-04-22 | Discharge: 2014-04-22 | Disposition: A | Discharge status: Home / Self Care | Payer: Medicare Other | Attending: Emergency Medicine | Admitting: Emergency Medicine

## 2014-04-22 ENCOUNTER — Telehealth: Payer: Self-pay | Admitting: Nurse Practitioner

## 2014-04-22 DIAGNOSIS — R079 Chest pain, unspecified: Secondary | ICD-10-CM | POA: Diagnosis not present

## 2014-04-22 DIAGNOSIS — IMO0002 Reserved for concepts with insufficient information to code with codable children: Secondary | ICD-10-CM | POA: Insufficient documentation

## 2014-04-22 DIAGNOSIS — Z87891 Personal history of nicotine dependence: Secondary | ICD-10-CM | POA: Diagnosis not present

## 2014-04-22 DIAGNOSIS — I252 Old myocardial infarction: Secondary | ICD-10-CM | POA: Insufficient documentation

## 2014-04-22 DIAGNOSIS — I10 Essential (primary) hypertension: Secondary | ICD-10-CM | POA: Insufficient documentation

## 2014-04-22 DIAGNOSIS — E119 Type 2 diabetes mellitus without complications: Secondary | ICD-10-CM | POA: Diagnosis not present

## 2014-04-22 DIAGNOSIS — R0602 Shortness of breath: Secondary | ICD-10-CM | POA: Insufficient documentation

## 2014-04-22 DIAGNOSIS — R1084 Generalized abdominal pain: Secondary | ICD-10-CM | POA: Diagnosis not present

## 2014-04-22 DIAGNOSIS — Z8701 Personal history of pneumonia (recurrent): Secondary | ICD-10-CM | POA: Diagnosis not present

## 2014-04-22 DIAGNOSIS — I2510 Atherosclerotic heart disease of native coronary artery without angina pectoris: Secondary | ICD-10-CM | POA: Insufficient documentation

## 2014-04-22 DIAGNOSIS — R110 Nausea: Secondary | ICD-10-CM | POA: Diagnosis not present

## 2014-04-22 DIAGNOSIS — Z9861 Coronary angioplasty status: Secondary | ICD-10-CM | POA: Diagnosis not present

## 2014-04-22 DIAGNOSIS — R109 Unspecified abdominal pain: Secondary | ICD-10-CM

## 2014-04-22 DIAGNOSIS — K219 Gastro-esophageal reflux disease without esophagitis: Secondary | ICD-10-CM | POA: Diagnosis not present

## 2014-04-22 DIAGNOSIS — Z7982 Long term (current) use of aspirin: Secondary | ICD-10-CM | POA: Diagnosis not present

## 2014-04-22 DIAGNOSIS — E785 Hyperlipidemia, unspecified: Secondary | ICD-10-CM | POA: Insufficient documentation

## 2014-04-22 DIAGNOSIS — Z79899 Other long term (current) drug therapy: Secondary | ICD-10-CM | POA: Diagnosis not present

## 2014-04-22 LAB — COMPREHENSIVE METABOLIC PANEL
ALBUMIN: 4 g/dL (ref 3.5–5.2)
ALT: 28 U/L (ref 0–53)
AST: 27 U/L (ref 0–37)
Alkaline Phosphatase: 68 U/L (ref 39–117)
BUN: 17 mg/dL (ref 6–23)
CHLORIDE: 104 meq/L (ref 96–112)
CO2: 29 mEq/L (ref 19–32)
Calcium: 9.5 mg/dL (ref 8.4–10.5)
Creatinine, Ser: 0.8 mg/dL (ref 0.50–1.35)
GFR calc Af Amer: >90 mL/min (ref >90)
GFR calc non Af Amer: >90 mL/min (ref >90)
GLUCOSE: 131 mg/dL — AB (ref 70–99)
POTASSIUM: 4.5 meq/L (ref 3.7–5.3)
Sodium: 142 mEq/L (ref 137–147)
Total Bilirubin: 0.5 mg/dL (ref 0.3–1.2)
Total Protein: 6.7 g/dL (ref 6.0–8.3)

## 2014-04-22 LAB — CBC WITH DIFFERENTIAL/PLATELET
BASOS PCT: 0 % (ref 0–1)
Basophils Absolute: 0 10*3/uL (ref 0.0–0.1)
Eosinophils Absolute: 0.1 10*3/uL (ref 0.0–0.7)
Eosinophils Relative: 1 % (ref 0–5)
HCT: 40.1 % (ref 39.0–52.0)
HEMOGLOBIN: 13 g/dL (ref 13.0–17.0)
LYMPHS ABS: 2 10*3/uL (ref 0.7–4.0)
LYMPHS PCT: 24 % (ref 12–46)
MCH: 27.7 pg (ref 26.0–34.0)
MCHC: 32.4 g/dL (ref 30.0–36.0)
MCV: 85.5 fL (ref 78.0–100.0)
MONOS PCT: 9 % (ref 3–12)
Monocytes Absolute: 0.8 10*3/uL (ref 0.1–1.0)
NEUTROS ABS: 5.5 10*3/uL (ref 1.7–7.7)
NEUTROS PCT: 66 % (ref 43–77)
Platelets: 171 10*3/uL (ref 150–400)
RBC: 4.69 MIL/uL (ref 4.22–5.81)
RDW: 13.6 % (ref 11.5–15.5)
WBC: 8.3 10*3/uL (ref 4.0–10.5)

## 2014-04-22 LAB — PRO B NATRIURETIC PEPTIDE: Pro B Natriuretic peptide (BNP): 74.5 pg/mL (ref 0–125)

## 2014-04-22 LAB — TROPONIN I
Troponin I: <0.3 ng/mL (ref <0.30)
Troponin I: <0.3 ng/mL (ref <0.30)

## 2014-04-22 LAB — LIPASE, BLOOD: LIPASE: 23 U/L (ref 11–59)

## 2014-04-22 MED ORDER — ACETAMINOPHEN 325 MG PO TABS
650.0000 mg | ORAL_TABLET | Freq: Once | ORAL | Status: AC
  Administered 2014-04-22: 650 mg via ORAL
  Filled 2014-04-22: qty 2

## 2014-04-22 MED ORDER — SODIUM CHLORIDE 0.9 % IV BOLUS (SEPSIS)
1000.0000 mL | Freq: Once | INTRAVENOUS | Status: AC
  Administered 2014-04-22: 1000 mL via INTRAVENOUS

## 2014-04-22 MED ORDER — NITROGLYCERIN 0.4 MG SL SUBL
0.4000 mg | SUBLINGUAL_TABLET | SUBLINGUAL | Status: AC | PRN
  Administered 2014-04-22 (×3): 0.4 mg via SUBLINGUAL
  Filled 2014-04-22: qty 1

## 2014-04-22 MED ORDER — ASPIRIN 81 MG PO CHEW
324.0000 mg | CHEWABLE_TABLET | Freq: Once | ORAL | Status: AC
  Administered 2014-04-22: 324 mg via ORAL
  Filled 2014-04-22: qty 4

## 2014-04-22 NOTE — Telephone Encounter (Signed)
Patient's sps called stating [facility] [employer] needs letter to verify patient kept appt with CM on 5/1. In order for patient to continue receiving transportation.  Please fax verification letter to [phone] or call [phone] with any questions.

## 2014-04-22 NOTE — Telephone Encounter (Signed)
Called pt, no answer, could leave voice mail concerning the letter that was faxed over to [facility] [employer] and confirmation was received.

## 2014-04-22 NOTE — Discharge Instructions (Signed)
Chest Pain (Nonspecific) °It is often hard to give a specific diagnosis for the cause of chest pain. There is always a chance that your pain could be related to something serious, such as a heart attack or a blood clot in the lungs. You need to follow up with your caregiver for further evaluation. °CAUSES  °· Heartburn. °· Pneumonia or bronchitis. °· Anxiety or stress. °· Inflammation around your heart (pericarditis) or lung (pleuritis or pleurisy). °· A blood clot in the lung. °· A collapsed lung (pneumothorax). It can develop suddenly on its own (spontaneous pneumothorax) or from injury (trauma) to the chest. °· Shingles infection (herpes zoster virus). °The chest wall is composed of bones, muscles, and cartilage. Any of these can be the source of the pain. °· The bones can be bruised by injury. °· The muscles or cartilage can be strained by coughing or overwork. °· The cartilage can be affected by inflammation and become sore (costochondritis). °DIAGNOSIS  °Lab tests or other studies, such as X-rays, electrocardiography, stress testing, or cardiac imaging, may be needed to find the cause of your pain.  °TREATMENT  °· Treatment depends on what may be causing your chest pain. Treatment may include: °· Acid blockers for heartburn. °· Anti-inflammatory medicine. °· Pain medicine for inflammatory conditions. °· Antibiotics if an infection is present. °· You may be advised to change lifestyle habits. This includes stopping smoking and avoiding alcohol, caffeine, and chocolate. °· You may be advised to keep your head raised (elevated) when sleeping. This reduces the chance of acid going backward from your stomach into your esophagus. °· Most of the time, nonspecific chest pain will improve within 2 to 3 days with rest and mild pain medicine. °HOME CARE INSTRUCTIONS  °· If antibiotics were prescribed, take your antibiotics as directed. Finish them even if you start to feel better. °· For the next few days, avoid physical  activities that bring on chest pain. Continue physical activities as directed. °· Do not smoke. °· Avoid drinking alcohol. °· Only take over-the-counter or prescription medicine for pain, discomfort, or fever as directed by your caregiver. °· Follow your caregiver's suggestions for further testing if your chest pain does not go away. °· Keep any follow-up appointments you made. If you do not go to an appointment, you could develop lasting (chronic) problems with pain. If there is any problem keeping an appointment, you must call to reschedule. °SEEK MEDICAL CARE IF:  °· You think you are having problems from the medicine you are taking. Read your medicine instructions carefully. °· Your chest pain does not go away, even after treatment. °· You develop a rash with blisters on your chest. °SEEK IMMEDIATE MEDICAL CARE IF:  °· You have increased chest pain or pain that spreads to your arm, neck, jaw, back, or abdomen. °· You develop shortness of breath, an increasing cough, or you are coughing up blood. °· You have severe back or abdominal pain, feel nauseous, or vomit. °· You develop severe weakness, fainting, or chills. °· You have a fever. °THIS IS AN EMERGENCY. Do not wait to see if the pain will go away. Get medical help at once. Call your local emergency services (911 in [place] Do not drive yourself to the hospital. °MAKE SURE YOU:  °· Understand these instructions. °· Will watch your condition. °· Will get help right away if you are not doing well or get worse. °Document Released: 08/31/2005 Document Revised: 02/13/2012 Document Reviewed: 06/26/2008 °[employer]® Patient Information ©2014 [employer]   [employer]. ° °
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
## 2014-04-22 NOTE — ED Notes (Signed)
Pt complain of  RUQ pain since yesterday morning. Pt complain of nausea but denies vomit/diarrhea. Pt pain 8/10. Pt has hx of GERD and peptic ulcers. Bowel sounds hyperactive all four quad.

## 2014-05-28 ENCOUNTER — Other Ambulatory Visit: Payer: Self-pay | Admitting: Podiatry

## 2014-06-09 ENCOUNTER — Encounter (HOSPITAL_BASED_OUTPATIENT_CLINIC_OR_DEPARTMENT_OTHER): Payer: Self-pay | Admitting: Emergency Medicine

## 2014-06-09 NOTE — ED Notes (Signed)
Pt today with rlq abd pain and n/v x 1 episode.

## 2014-06-10 ENCOUNTER — Emergency Department (HOSPITAL_BASED_OUTPATIENT_CLINIC_OR_DEPARTMENT_OTHER): Payer: Medicare Other

## 2014-06-10 ENCOUNTER — Emergency Department (HOSPITAL_BASED_OUTPATIENT_CLINIC_OR_DEPARTMENT_OTHER)
Admission: EM | Admit: 2014-06-10 | Discharge: 2014-06-10 | Disposition: A | Discharge status: Home / Self Care | Payer: Medicare Other | Attending: Emergency Medicine | Admitting: Emergency Medicine

## 2014-06-10 DIAGNOSIS — R1012 Left upper quadrant pain: Secondary | ICD-10-CM | POA: Insufficient documentation

## 2014-06-10 DIAGNOSIS — R109 Unspecified abdominal pain: Secondary | ICD-10-CM | POA: Diagnosis not present

## 2014-06-10 DIAGNOSIS — R142 Eructation: Secondary | ICD-10-CM

## 2014-06-10 DIAGNOSIS — Z79899 Other long term (current) drug therapy: Secondary | ICD-10-CM | POA: Diagnosis not present

## 2014-06-10 DIAGNOSIS — R1011 Right upper quadrant pain: Secondary | ICD-10-CM | POA: Diagnosis present

## 2014-06-10 DIAGNOSIS — E785 Hyperlipidemia, unspecified: Secondary | ICD-10-CM | POA: Diagnosis not present

## 2014-06-10 DIAGNOSIS — Z87891 Personal history of nicotine dependence: Secondary | ICD-10-CM | POA: Diagnosis not present

## 2014-06-10 DIAGNOSIS — R141 Gas pain: Secondary | ICD-10-CM | POA: Diagnosis not present

## 2014-06-10 DIAGNOSIS — Z9861 Coronary angioplasty status: Secondary | ICD-10-CM | POA: Insufficient documentation

## 2014-06-10 DIAGNOSIS — K219 Gastro-esophageal reflux disease without esophagitis: Secondary | ICD-10-CM | POA: Diagnosis not present

## 2014-06-10 DIAGNOSIS — I10 Essential (primary) hypertension: Secondary | ICD-10-CM | POA: Diagnosis not present

## 2014-06-10 DIAGNOSIS — IMO0002 Reserved for concepts with insufficient information to code with codable children: Secondary | ICD-10-CM | POA: Insufficient documentation

## 2014-06-10 DIAGNOSIS — R143 Flatulence: Secondary | ICD-10-CM

## 2014-06-10 DIAGNOSIS — Z7982 Long term (current) use of aspirin: Secondary | ICD-10-CM | POA: Insufficient documentation

## 2014-06-10 DIAGNOSIS — I252 Old myocardial infarction: Secondary | ICD-10-CM | POA: Insufficient documentation

## 2014-06-10 DIAGNOSIS — R1031 Right lower quadrant pain: Secondary | ICD-10-CM | POA: Diagnosis not present

## 2014-06-10 DIAGNOSIS — Z8701 Personal history of pneumonia (recurrent): Secondary | ICD-10-CM | POA: Insufficient documentation

## 2014-06-10 DIAGNOSIS — IMO0001 Reserved for inherently not codable concepts without codable children: Secondary | ICD-10-CM

## 2014-06-10 DIAGNOSIS — I2510 Atherosclerotic heart disease of native coronary artery without angina pectoris: Secondary | ICD-10-CM | POA: Diagnosis not present

## 2014-06-10 DIAGNOSIS — E119 Type 2 diabetes mellitus without complications: Secondary | ICD-10-CM | POA: Diagnosis not present

## 2014-06-10 LAB — CBC WITH DIFFERENTIAL/PLATELET
BASOS ABS: 0 10*3/uL (ref 0.0–0.1)
Basophils Relative: 0 % (ref 0–1)
EOS PCT: 2 % (ref 0–5)
Eosinophils Absolute: 0.2 10*3/uL (ref 0.0–0.7)
HCT: 45.6 % (ref 39.0–52.0)
Hemoglobin: 14.6 g/dL (ref 13.0–17.0)
LYMPHS ABS: 1.7 10*3/uL (ref 0.7–4.0)
Lymphocytes Relative: 17 % (ref 12–46)
MCH: 27.3 pg (ref 26.0–34.0)
MCHC: 32 g/dL (ref 30.0–36.0)
MCV: 85.2 fL (ref 78.0–100.0)
Monocytes Absolute: 0.7 10*3/uL (ref 0.1–1.0)
Monocytes Relative: 7 % (ref 3–12)
Neutro Abs: 7.2 10*3/uL (ref 1.7–7.7)
Neutrophils Relative %: 74 % (ref 43–77)
Platelets: 176 10*3/uL (ref 150–400)
RBC: 5.35 MIL/uL (ref 4.22–5.81)
RDW: 15.3 % (ref 11.5–15.5)
WBC: 9.7 10*3/uL (ref 4.0–10.5)

## 2014-06-10 LAB — COMPREHENSIVE METABOLIC PANEL
ALT: 21 U/L (ref 0–53)
ANION GAP: 11 (ref 5–15)
AST: 22 U/L (ref 0–37)
Albumin: 4.7 g/dL (ref 3.5–5.2)
Alkaline Phosphatase: 75 U/L (ref 39–117)
BUN: 12 mg/dL (ref 6–23)
CALCIUM: 9.9 mg/dL (ref 8.4–10.5)
CO2: 29 meq/L (ref 19–32)
CREATININE: 0.9 mg/dL (ref 0.50–1.35)
Chloride: 102 mEq/L (ref 96–112)
GFR, EST NON AFRICAN AMERICAN: 87 mL/min — AB (ref >90)
GLUCOSE: 119 mg/dL — AB (ref 70–99)
Potassium: 4.3 mEq/L (ref 3.7–5.3)
Sodium: 142 mEq/L (ref 137–147)
Total Bilirubin: 0.4 mg/dL (ref 0.3–1.2)
Total Protein: 8 g/dL (ref 6.0–8.3)

## 2014-06-10 LAB — I-STAT CHEM 8, ED
BUN: 11 mg/dL (ref 6–23)
CALCIUM ION: 1.26 mmol/L (ref 1.13–1.30)
Chloride: 101 mEq/L (ref 96–112)
Creatinine, Ser: 0.8 mg/dL (ref 0.50–1.35)
GLUCOSE: 114 mg/dL — AB (ref 70–99)
HCT: 49 % (ref 39.0–52.0)
Hemoglobin: 16.7 g/dL (ref 13.0–17.0)
Potassium: 4.1 mEq/L (ref 3.7–5.3)
Sodium: 141 mEq/L (ref 137–147)
TCO2: 30 mmol/L (ref 0–100)

## 2014-06-10 LAB — URINALYSIS, ROUTINE W REFLEX MICROSCOPIC
Bilirubin Urine: NEGATIVE
Glucose, UA: NEGATIVE mg/dL
Hgb urine dipstick: NEGATIVE
KETONES UR: NEGATIVE mg/dL
LEUKOCYTES UA: NEGATIVE
Nitrite: NEGATIVE
PROTEIN: NEGATIVE mg/dL
Specific Gravity, Urine: 1.016 (ref 1.005–1.030)
UROBILINOGEN UA: 0.2 mg/dL (ref 0.0–1.0)
pH: 8.5 — ABNORMAL HIGH (ref 5.0–8.0)

## 2014-06-10 LAB — I-STAT CG4 LACTIC ACID, ED: Lactic Acid, Venous: 0.75 mmol/L (ref 0.5–2.2)

## 2014-06-10 LAB — LIPASE, BLOOD: LIPASE: 34 U/L (ref 11–59)

## 2014-06-10 LAB — TROPONIN I: Troponin I: <0.3 ng/mL (ref <0.30)

## 2014-06-10 MED ORDER — SUCRALFATE 1 GM/10ML PO SUSP: 1.0000 g | Freq: Three times a day (TID) | ORAL | Status: DC

## 2014-06-10 MED ORDER — DICYCLOMINE HCL 20 MG PO TABS: 20.0000 mg | ORAL_TABLET | Freq: Two times a day (BID) | ORAL | Status: DC

## 2014-06-10 MED ORDER — POLYETHYLENE GLYCOL 3350 17 GM/SCOOP PO POWD: 1.0000 | Freq: Once | ORAL | Status: DC

## 2014-06-10 MED ORDER — KETOROLAC TROMETHAMINE 30 MG/ML IJ SOLN
30.0000 mg | Freq: Once | INTRAMUSCULAR | Status: AC
  Administered 2014-06-10: 30 mg via INTRAVENOUS
  Filled 2014-06-10: qty 1

## 2014-06-10 MED ORDER — IOHEXOL 350 MG/ML SOLN
100.0000 mL | Freq: Once | INTRAVENOUS | Status: AC | PRN
  Administered 2014-06-10: 100 mL via INTRAVENOUS

## 2014-06-10 MED ORDER — ONDANSETRON HCL 4 MG/2ML IJ SOLN
4.0000 mg | Freq: Once | INTRAMUSCULAR | Status: AC
  Administered 2014-06-10: 4 mg via INTRAVENOUS
  Filled 2014-06-10: qty 2

## 2014-06-10 MED ORDER — GI COCKTAIL ~~LOC~~
30.0000 mL | Freq: Once | ORAL | Status: AC
  Administered 2014-06-10: 30 mL via ORAL
  Filled 2014-06-10: qty 30

## 2014-06-10 MED ORDER — FENTANYL CITRATE 0.05 MG/ML IJ SOLN
100.0000 ug | Freq: Once | INTRAMUSCULAR | Status: AC
Start: 2014-06-10 — End: 2014-06-10
  Administered 2014-06-10: 100 ug via INTRAVENOUS
  Filled 2014-06-10: qty 2

## 2014-06-10 MED ORDER — DICYCLOMINE HCL 10 MG/ML IM SOLN
20.0000 mg | Freq: Once | INTRAMUSCULAR | Status: AC
  Administered 2014-06-10: 20 mg via INTRAMUSCULAR
  Filled 2014-06-10: qty 2

## 2014-06-10 MED ORDER — TRAMADOL HCL 50 MG PO TABS
50.0000 mg | ORAL_TABLET | Freq: Once | ORAL | Status: AC
  Administered 2014-06-10: 50 mg via ORAL
  Filled 2014-06-10: qty 1

## 2014-06-10 MED ORDER — SODIUM CHLORIDE 0.9 % IV BOLUS (SEPSIS)
500.0000 mL | Freq: Once | INTRAVENOUS | Status: AC
  Administered 2014-06-10: 500 mL via INTRAVENOUS
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
## 2014-06-10 NOTE — ED Notes (Signed)
Report given to AB RN

## 2014-06-10 NOTE — ED Notes (Signed)
MD at bedside. 

## 2014-07-07 ENCOUNTER — Other Ambulatory Visit: Payer: Self-pay | Admitting: *Deleted

## 2014-07-07 ENCOUNTER — Other Ambulatory Visit (INDEPENDENT_AMBULATORY_CARE_PROVIDER_SITE_OTHER): Payer: Self-pay

## 2014-07-07 ENCOUNTER — Encounter: Payer: Medicaid Other | Admitting: *Deleted

## 2014-07-07 DIAGNOSIS — E538 Deficiency of other specified B group vitamins: Secondary | ICD-10-CM

## 2014-07-07 DIAGNOSIS — Z0289 Encounter for other administrative examinations: Secondary | ICD-10-CM

## 2014-07-07 NOTE — Progress Notes (Signed)
Pt to come in for lab draw.

## 2014-07-08 LAB — VITAMIN B12: Vitamin B-12: 407 pg/mL (ref 211–946)

## 2014-07-09 ENCOUNTER — Telehealth: Payer: Self-pay | Admitting: Nurse Practitioner

## 2014-07-09 NOTE — Telephone Encounter (Signed)
Please have patient come in for repeat B12 level.at the lab Thanks

## 2014-07-09 NOTE — Telephone Encounter (Signed)
Pt had B12 lab on 07-07-14.  Resulted at 407.  Pt has been notified.  Pt to continue on oral supplementation.

## 2014-07-10 NOTE — Telephone Encounter (Signed)
Noted  

## 2014-07-25 ENCOUNTER — Other Ambulatory Visit: Payer: Self-pay | Admitting: Podiatry

## 2014-09-24 ENCOUNTER — Other Ambulatory Visit: Payer: Self-pay | Admitting: Podiatry

## 2014-10-07 ENCOUNTER — Ambulatory Visit (INDEPENDENT_AMBULATORY_CARE_PROVIDER_SITE_OTHER): Payer: Medicare Other | Admitting: Adult Health

## 2014-10-07 ENCOUNTER — Encounter: Payer: Self-pay | Admitting: Adult Health

## 2014-10-07 VITALS — BP 129/67 | HR 75 | Ht 69.5 in | Wt 199.0 lb

## 2014-10-07 DIAGNOSIS — R531 Weakness: Secondary | ICD-10-CM

## 2014-10-07 DIAGNOSIS — E538 Deficiency of other specified B group vitamins: Secondary | ICD-10-CM | POA: Diagnosis not present

## 2014-10-07 DIAGNOSIS — R413 Other amnesia: Secondary | ICD-10-CM
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
## 2014-10-07 NOTE — Patient Instructions (Signed)

## 2014-10-07 NOTE — Progress Notes (Signed)
I have read the note, and I agree with the clinical assessment and plan.  Roselie Cirigliano KEITH   

## 2014-10-08 ENCOUNTER — Telehealth: Payer: Self-pay | Admitting: Adult Health

## 2014-10-08 LAB — VITAMIN B12: Vitamin B-12: 525 pg/mL (ref 211–946)

## 2014-10-08 NOTE — Telephone Encounter (Signed)
I called the patient. His vitamin B12 level was normal. I informed the patient that [facility] was [facility]. Patient is amendable to this.

## 2014-10-15 ENCOUNTER — Other Ambulatory Visit: Payer: Self-pay | Admitting: Podiatry

## 2014-10-22 ENCOUNTER — Encounter: Payer: Self-pay | Admitting: Neurology

## 2014-10-28 ENCOUNTER — Encounter: Payer: Self-pay | Admitting: Neurology

## 2014-11-12 ENCOUNTER — Other Ambulatory Visit: Payer: Self-pay | Admitting: Podiatry

## 2014-12-10 ENCOUNTER — Other Ambulatory Visit: Payer: Self-pay | Admitting: Podiatry

## 2015-02-05 ENCOUNTER — Ambulatory Visit: Payer: Medicaid Other | Admitting: Adult Health

## 2015-02-05 ENCOUNTER — Ambulatory Visit: Payer: Medicaid Other | Admitting: Neurology

## 2015-02-10 ENCOUNTER — Ambulatory Visit: Payer: Medicaid Other | Admitting: Neurology

## 2015-09-14 ENCOUNTER — Emergency Department (HOSPITAL_COMMUNITY): Payer: Medicare Other

## 2015-09-14 ENCOUNTER — Encounter (HOSPITAL_COMMUNITY): Payer: Self-pay

## 2015-09-14 ENCOUNTER — Inpatient Hospital Stay (HOSPITAL_COMMUNITY)
Admission: EM | Admit: 2015-09-14 | Discharge: 2015-09-28 | DRG: 233 | Disposition: A | Discharge status: Skilled Nursing Facility | Payer: Medicare Other | Attending: Surgery | Admitting: Surgery

## 2015-09-14 DIAGNOSIS — D62 Acute posthemorrhagic anemia: Secondary | ICD-10-CM | POA: Diagnosis not present

## 2015-09-14 DIAGNOSIS — I219 Acute myocardial infarction, unspecified: Secondary | ICD-10-CM | POA: Diagnosis present

## 2015-09-14 DIAGNOSIS — E785 Hyperlipidemia, unspecified: Secondary | ICD-10-CM | POA: Diagnosis present

## 2015-09-14 DIAGNOSIS — Z7952 Long term (current) use of systemic steroids: Secondary | ICD-10-CM | POA: Diagnosis not present

## 2015-09-14 DIAGNOSIS — I214 Non-ST elevation (NSTEMI) myocardial infarction: Secondary | ICD-10-CM | POA: Diagnosis present

## 2015-09-14 DIAGNOSIS — F05 Delirium due to known physiological condition: Secondary | ICD-10-CM | POA: Diagnosis not present

## 2015-09-14 DIAGNOSIS — J45909 Unspecified asthma, uncomplicated: Secondary | ICD-10-CM | POA: Diagnosis present

## 2015-09-14 DIAGNOSIS — T447X5A Adverse effect of beta-adrenoreceptor antagonists, initial encounter: Secondary | ICD-10-CM | POA: Diagnosis not present

## 2015-09-14 DIAGNOSIS — I2510 Atherosclerotic heart disease of native coronary artery without angina pectoris: Secondary | ICD-10-CM | POA: Diagnosis present

## 2015-09-14 DIAGNOSIS — I959 Hypotension, unspecified: Secondary | ICD-10-CM | POA: Diagnosis not present

## 2015-09-14 DIAGNOSIS — Z955 Presence of coronary angioplasty implant and graft: Secondary | ICD-10-CM

## 2015-09-14 DIAGNOSIS — I110 Hypertensive heart disease with heart failure: Secondary | ICD-10-CM | POA: Diagnosis present

## 2015-09-14 DIAGNOSIS — K219 Gastro-esophageal reflux disease without esophagitis: Secondary | ICD-10-CM | POA: Diagnosis present

## 2015-09-14 DIAGNOSIS — K5900 Constipation, unspecified: Secondary | ICD-10-CM | POA: Diagnosis not present

## 2015-09-14 DIAGNOSIS — Z951 Presence of aortocoronary bypass graft: Secondary | ICD-10-CM

## 2015-09-14 DIAGNOSIS — Z7982 Long term (current) use of aspirin: Secondary | ICD-10-CM

## 2015-09-14 DIAGNOSIS — I5033 Acute on chronic diastolic (congestive) heart failure: Secondary | ICD-10-CM | POA: Diagnosis present

## 2015-09-14 DIAGNOSIS — E119 Type 2 diabetes mellitus without complications: Secondary | ICD-10-CM | POA: Diagnosis present

## 2015-09-14 DIAGNOSIS — Z87891 Personal history of nicotine dependence: Secondary | ICD-10-CM | POA: Diagnosis not present

## 2015-09-14 DIAGNOSIS — E871 Hypo-osmolality and hyponatremia: Secondary | ICD-10-CM | POA: Diagnosis not present

## 2015-09-14 DIAGNOSIS — I252 Old myocardial infarction: Secondary | ICD-10-CM

## 2015-09-14 DIAGNOSIS — R079 Chest pain, unspecified: Secondary | ICD-10-CM | POA: Diagnosis present

## 2015-09-14 DIAGNOSIS — E872 Acidosis: Secondary | ICD-10-CM | POA: Diagnosis not present

## 2015-09-14 DIAGNOSIS — J9811 Atelectasis: Secondary | ICD-10-CM | POA: Diagnosis not present

## 2015-09-14 DIAGNOSIS — I25110 Atherosclerotic heart disease of native coronary artery with unstable angina pectoris: Secondary | ICD-10-CM | POA: Diagnosis not present

## 2015-09-14 LAB — APTT: APTT: 24 s (ref 24–37)

## 2015-09-14 LAB — I-STAT TROPONIN, ED
Troponin i, poc: 0.1 ng/mL (ref 0.00–0.08)
Troponin i, poc: 0.19 ng/mL (ref 0.00–0.08)

## 2015-09-14 LAB — BASIC METABOLIC PANEL
Anion gap: 7 (ref 5–15)
BUN: 14 mg/dL (ref 6–20)
CALCIUM: 8.9 mg/dL (ref 8.9–10.3)
CO2: 27 mmol/L (ref 22–32)
CREATININE: 1.06 mg/dL (ref 0.61–1.24)
Chloride: 99 mmol/L — ABNORMAL LOW (ref 101–111)
GFR calc Af Amer: >60 mL/min (ref >60)
GLUCOSE: 267 mg/dL — AB (ref 65–99)
Potassium: 4.2 mmol/L (ref 3.5–5.1)
Sodium: 133 mmol/L — ABNORMAL LOW (ref 135–145)

## 2015-09-14 LAB — HEPATIC FUNCTION PANEL
ALBUMIN: 3.3 g/dL — AB (ref 3.5–5.0)
ALK PHOS: 72 U/L (ref 38–126)
ALT: 27 U/L (ref 17–63)
AST: 30 U/L (ref 15–41)
BILIRUBIN TOTAL: 0.1 mg/dL — AB (ref 0.3–1.2)
Total Protein: 6.2 g/dL — ABNORMAL LOW (ref 6.5–8.1)

## 2015-09-14 LAB — GLUCOSE, CAPILLARY: GLUCOSE-CAPILLARY: 159 mg/dL — AB (ref 65–99)

## 2015-09-14 LAB — TROPONIN I: Troponin I: 0.16 ng/mL — ABNORMAL HIGH (ref <0.031)

## 2015-09-14 LAB — CBC
HCT: 36.2 % — ABNORMAL LOW (ref 39.0–52.0)
Hemoglobin: 11.2 g/dL — ABNORMAL LOW (ref 13.0–17.0)
MCH: 24.7 pg — AB (ref 26.0–34.0)
MCHC: 30.9 g/dL (ref 30.0–36.0)
MCV: 79.7 fL (ref 78.0–100.0)
Platelets: 216 10*3/uL (ref 150–400)
RBC: 4.54 MIL/uL (ref 4.22–5.81)
RDW: 14.9 % (ref 11.5–15.5)
WBC: 8.9 10*3/uL (ref 4.0–10.5)

## 2015-09-14 LAB — D-DIMER, QUANTITATIVE: D-Dimer, Quant: 0.29 ug/mL-FEU (ref 0.00–0.48)

## 2015-09-14 LAB — TSH: TSH: 3.387 u[IU]/mL (ref 0.350–4.500)

## 2015-09-14 LAB — PROTIME-INR
INR: 1.05 (ref 0.00–1.49)
PROTHROMBIN TIME: 13.9 s (ref 11.6–15.2)

## 2015-09-14 MED ORDER — SODIUM CHLORIDE 0.9 % IV SOLN
INTRAVENOUS | Status: DC
Start: 2015-09-14 — End: 2015-09-17

## 2015-09-14 MED ORDER — DICYCLOMINE HCL 20 MG PO TABS
20.0000 mg | ORAL_TABLET | Freq: Two times a day (BID) | ORAL | Status: DC
  Administered 2015-09-14 – 2015-09-25 (×16): 20 mg via ORAL
  Filled 2015-09-14 (×28): qty 1

## 2015-09-14 MED ORDER — INSULIN ASPART 100 UNIT/ML ~~LOC~~ SOLN
0.0000 [IU] | Freq: Three times a day (TID) | SUBCUTANEOUS | Status: DC
  Administered 2015-09-15 (×2): 1 [IU] via SUBCUTANEOUS

## 2015-09-14 MED ORDER — TAMSULOSIN HCL 0.4 MG PO CAPS
0.4000 mg | ORAL_CAPSULE | Freq: Every day | ORAL | Status: DC
  Administered 2015-09-14 – 2015-09-27 (×10): 0.4 mg via ORAL
  Filled 2015-09-14 (×14): qty 1

## 2015-09-14 MED ORDER — NITROGLYCERIN 0.4 MG SL SUBL: 0.4000 mg | SUBLINGUAL_TABLET | SUBLINGUAL | Status: DC | PRN

## 2015-09-14 MED ORDER — MONTELUKAST SODIUM 10 MG PO TABS
10.0000 mg | ORAL_TABLET | Freq: Every day | ORAL | Status: DC
  Administered 2015-09-14 – 2015-09-27 (×10): 10 mg via ORAL
  Filled 2015-09-14 (×14): qty 1

## 2015-09-14 MED ORDER — FLUTICASONE PROPIONATE 50 MCG/ACT NA SUSP
2.0000 | Freq: Every day | NASAL | Status: DC
  Administered 2015-09-17 – 2015-09-28 (×7): 2 via NASAL
  Filled 2015-09-14 (×3): qty 16

## 2015-09-14 MED ORDER — MOMETASONE FURO-FORMOTEROL FUM 200-5 MCG/ACT IN AERO
2.0000 | INHALATION_SPRAY | Freq: Two times a day (BID) | RESPIRATORY_TRACT | Status: DC
  Administered 2015-09-14 – 2015-09-28 (×21): 2 via RESPIRATORY_TRACT
  Filled 2015-09-14 (×4): qty 8.8

## 2015-09-14 MED ORDER — SODIUM CHLORIDE 0.9 % IV SOLN: 250.0000 mL | INTRAVENOUS | Status: DC | PRN

## 2015-09-14 MED ORDER — ACETAMINOPHEN 325 MG PO TABS
650.0000 mg | ORAL_TABLET | ORAL | Status: DC | PRN
  Administered 2015-09-15 – 2015-09-16 (×4): 650 mg via ORAL
  Filled 2015-09-14 (×3): qty 2

## 2015-09-14 MED ORDER — LORATADINE 10 MG PO TABS
10.0000 mg | ORAL_TABLET | Freq: Every day | ORAL | Status: DC
  Administered 2015-09-15 – 2015-09-28 (×12): 10 mg via ORAL
  Filled 2015-09-14 (×13): qty 1

## 2015-09-14 MED ORDER — TERBINAFINE HCL 250 MG PO TABS
250.0000 mg | ORAL_TABLET | Freq: Every day | ORAL | Status: DC
  Administered 2015-09-15 – 2015-09-28 (×12): 250 mg via ORAL
  Filled 2015-09-14 (×14): qty 1

## 2015-09-14 MED ORDER — HEPARIN BOLUS VIA INFUSION
4000.0000 [IU] | Freq: Once | INTRAVENOUS | Status: AC
  Administered 2015-09-14: 4000 [IU] via INTRAVENOUS
  Filled 2015-09-14: qty 4000

## 2015-09-14 MED ORDER — LISINOPRIL 5 MG PO TABS
5.0000 mg | ORAL_TABLET | Freq: Every day | ORAL | Status: DC
  Administered 2015-09-15 – 2015-09-16 (×2): 5 mg via ORAL
  Filled 2015-09-14 (×2): qty 1

## 2015-09-14 MED ORDER — VITAMIN D3 25 MCG (1000 UNIT) PO TABS
1000.0000 [IU] | ORAL_TABLET | Freq: Every day | ORAL | Status: DC
  Administered 2015-09-15 – 2015-09-17 (×3): 1000 [IU] via ORAL
  Filled 2015-09-14 (×3): qty 1

## 2015-09-14 MED ORDER — SODIUM CHLORIDE 0.9 % IJ SOLN
3.0000 mL | Freq: Two times a day (BID) | INTRAMUSCULAR | Status: DC
  Administered 2015-09-15: 3 mL via INTRAVENOUS

## 2015-09-14 MED ORDER — PANTOPRAZOLE SODIUM 40 MG PO TBEC
40.0000 mg | DELAYED_RELEASE_TABLET | Freq: Every day | ORAL | Status: DC
  Administered 2015-09-15 – 2015-09-17 (×3): 40 mg via ORAL
  Filled 2015-09-14 (×3): qty 1

## 2015-09-14 MED ORDER — MORPHINE SULFATE (PF) 4 MG/ML IV SOLN
4.0000 mg | Freq: Once | INTRAVENOUS | Status: AC
  Administered 2015-09-14: 4 mg via INTRAVENOUS
  Filled 2015-09-14: qty 1

## 2015-09-14 MED ORDER — SODIUM CHLORIDE 0.9 % IV SOLN
1000.0000 mL | INTRAVENOUS | Status: DC
  Administered 2015-09-14: 1000 mL via INTRAVENOUS

## 2015-09-14 MED ORDER — DULOXETINE HCL 60 MG PO CPEP
60.0000 mg | ORAL_CAPSULE | Freq: Every day | ORAL | Status: DC
  Administered 2015-09-15 – 2015-09-28 (×12): 60 mg via ORAL
  Filled 2015-09-14 (×13): qty 1

## 2015-09-14 MED ORDER — ALBUTEROL SULFATE (2.5 MG/3ML) 0.083% IN NEBU: 3.0000 mL | INHALATION_SOLUTION | RESPIRATORY_TRACT | Status: DC | PRN

## 2015-09-14 MED ORDER — SODIUM CHLORIDE 0.9 % WEIGHT BASED INFUSION
1.0000 mL/kg/h | INTRAVENOUS | Status: DC
  Administered 2015-09-14: 1 mL/kg/h via INTRAVENOUS
  Administered 2015-09-15: 500 mL via INTRAVENOUS
  Administered 2015-09-15: 1 mL/kg/h via INTRAVENOUS

## 2015-09-14 MED ORDER — ASPIRIN EC 81 MG PO TBEC
81.0000 mg | DELAYED_RELEASE_TABLET | Freq: Every day | ORAL | Status: DC
  Administered 2015-09-15 – 2015-09-17 (×3): 81 mg via ORAL
  Filled 2015-09-14 (×3): qty 1

## 2015-09-14 MED ORDER — PREGABALIN 75 MG PO CAPS
100.0000 mg | ORAL_CAPSULE | Freq: Three times a day (TID) | ORAL | Status: DC
  Administered 2015-09-14 – 2015-09-28 (×31): 100 mg via ORAL
  Filled 2015-09-14: qty 2
  Filled 2015-09-14: qty 1
  Filled 2015-09-14: qty 2
  Filled 2015-09-14 (×4): qty 1
  Filled 2015-09-14 (×2): qty 2
  Filled 2015-09-14 (×21): qty 1
  Filled 2015-09-14: qty 2
  Filled 2015-09-14: qty 1
  Filled 2015-09-14: qty 2
  Filled 2015-09-14 (×2): qty 1
  Filled 2015-09-14: qty 2
  Filled 2015-09-14 (×2): qty 1
  Filled 2015-09-14 (×2): qty 2
  Filled 2015-09-14 (×3): qty 1

## 2015-09-14 MED ORDER — HEPARIN (PORCINE) IN NACL 100-0.45 UNIT/ML-% IJ SOLN
1150.0000 [IU]/h | INTRAMUSCULAR | Status: DC
  Administered 2015-09-14: 1250 [IU]/h via INTRAVENOUS
  Administered 2015-09-15: 1150 [IU]/h via INTRAVENOUS
  Filled 2015-09-14 (×2): qty 250

## 2015-09-14 MED ORDER — CLOPIDOGREL BISULFATE 75 MG PO TABS
75.0000 mg | ORAL_TABLET | Freq: Every day | ORAL | Status: DC
  Administered 2015-09-15: 75 mg via ORAL
  Filled 2015-09-14: qty 1

## 2015-09-14 MED ORDER — LEVOFLOXACIN 500 MG PO TABS
500.0000 mg | ORAL_TABLET | Freq: Every day | ORAL | Status: DC
  Administered 2015-09-14 – 2015-09-17 (×4): 500 mg via ORAL
  Filled 2015-09-14 (×4): qty 1

## 2015-09-14 MED ORDER — NITROGLYCERIN IN D5W 200-5 MCG/ML-% IV SOLN
0.0000 ug/min | Freq: Once | INTRAVENOUS | Status: AC
  Administered 2015-09-14: 20 ug/min via INTRAVENOUS
  Filled 2015-09-14: qty 250

## 2015-09-14 MED ORDER — SODIUM CHLORIDE 0.9 % IJ SOLN: 3.0000 mL | INTRAMUSCULAR | Status: DC | PRN

## 2015-09-14 MED ORDER — ATORVASTATIN CALCIUM 80 MG PO TABS
80.0000 mg | ORAL_TABLET | Freq: Every day | ORAL | Status: DC
  Administered 2015-09-14 – 2015-09-27 (×11): 80 mg via ORAL
  Filled 2015-09-14 (×15): qty 1

## 2015-09-14 MED ORDER — ATENOLOL 25 MG PO TABS
25.0000 mg | ORAL_TABLET | Freq: Every day | ORAL | Status: DC
  Administered 2015-09-15 – 2015-09-17 (×3): 25 mg via ORAL
  Filled 2015-09-14 (×4): qty 1

## 2015-09-14 MED ORDER — SODIUM CHLORIDE 0.9 % IJ SOLN
3.0000 mL | Freq: Two times a day (BID) | INTRAMUSCULAR | Status: DC
  Administered 2015-09-15 – 2015-09-16 (×2): 3 mL via INTRAVENOUS

## 2015-09-14 MED ORDER — LINACLOTIDE 145 MCG PO CAPS
145.0000 ug | ORAL_CAPSULE | Freq: Every day | ORAL | Status: DC
  Administered 2015-09-15 – 2015-09-28 (×10): 145 ug via ORAL
  Filled 2015-09-14 (×17): qty 1

## 2015-09-14 NOTE — ED Notes (Signed)
Pt returned from X-ray.  
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
## 2015-09-14 NOTE — ED Notes (Signed)
Per EMS, Patient was out running errands when he stated to have midsternal chest pain with diaphoresis. Patient went home to take Nitro and took one Nitro and pain decreased from 10/10 to 8/10. With EMS, Patient has 324 mg of Aspirin and 5 SL Nitro. Pt's pain upon arrival to ED was 5/10. Vitals per EMS: 130/90, 84 HR, 100% on 2L [place]. Alert and Oriented x4.

## 2015-09-14 NOTE — ED Notes (Signed)
Attempted Report x1.   

## 2015-09-14 NOTE — ED Notes (Signed)
Spoke to Dr. Jacinto Halim and made aware of troponin results. Telephone orders received.

## 2015-09-15 ENCOUNTER — Encounter (HOSPITAL_COMMUNITY)
Admission: EM | Disposition: A | Discharge status: Skilled Nursing Facility | Payer: Self-pay | Source: Home / Self Care | Attending: Surgery

## 2015-09-15 DIAGNOSIS — I25110 Atherosclerotic heart disease of native coronary artery with unstable angina pectoris: Secondary | ICD-10-CM

## 2015-09-15 HISTORY — PX: CARDIAC CATHETERIZATION: SHX172

## 2015-09-15 LAB — LIPID PANEL
Cholesterol: 153 mg/dL (ref 0–200)
HDL: 46 mg/dL (ref >40)
LDL CALC: 87 mg/dL (ref 0–99)
Total CHOL/HDL Ratio: 3.3 RATIO
Triglycerides: 100 mg/dL (ref <150)
VLDL: 20 mg/dL (ref 0–40)

## 2015-09-15 LAB — TROPONIN I
TROPONIN I: 0.12 ng/mL — AB (ref <0.031)
Troponin I: 0.12 ng/mL — ABNORMAL HIGH (ref <0.031)

## 2015-09-15 LAB — BASIC METABOLIC PANEL
Anion gap: 7 (ref 5–15)
BUN: 13 mg/dL (ref 6–20)
CALCIUM: 8.3 mg/dL — AB (ref 8.9–10.3)
CO2: 26 mmol/L (ref 22–32)
CREATININE: 0.97 mg/dL (ref 0.61–1.24)
Chloride: 96 mmol/L — ABNORMAL LOW (ref 101–111)
GFR calc Af Amer: >60 mL/min (ref >60)
GLUCOSE: 201 mg/dL — AB (ref 65–99)
Potassium: 4.3 mmol/L (ref 3.5–5.1)
SODIUM: 129 mmol/L — AB (ref 135–145)

## 2015-09-15 LAB — HEPARIN LEVEL (UNFRACTIONATED)
HEPARIN UNFRACTIONATED: 0.5 [IU]/mL (ref 0.30–0.70)
HEPARIN UNFRACTIONATED: 0.77 [IU]/mL — AB (ref 0.30–0.70)

## 2015-09-15 LAB — SURGICAL PCR SCREEN
MRSA, PCR: NEGATIVE
Staphylococcus aureus: NEGATIVE

## 2015-09-15 LAB — GLUCOSE, CAPILLARY
GLUCOSE-CAPILLARY: 130 mg/dL — AB (ref 65–99)
GLUCOSE-CAPILLARY: 133 mg/dL — AB (ref 65–99)
Glucose-Capillary: 110 mg/dL — ABNORMAL HIGH (ref 65–99)

## 2015-09-15 LAB — PLATELET INHIBITION P2Y12: Platelet Function  P2Y12: 78 [PRU] — ABNORMAL LOW (ref 194–418)

## 2015-09-15 LAB — MRSA PCR SCREENING: MRSA by PCR: NEGATIVE

## 2015-09-15 SURGERY — LEFT HEART CATH AND CORONARY ANGIOGRAPHY

## 2015-09-15 MED ORDER — SODIUM CHLORIDE 0.9 % IV SOLN: 250.0000 mL | INTRAVENOUS | Status: DC | PRN

## 2015-09-15 MED ORDER — NITROGLYCERIN 1 MG/10 ML FOR IR/CATH LAB
INTRA_ARTERIAL | Status: DC | PRN
  Administered 2015-09-15: 16:00:00

## 2015-09-15 MED ORDER — INFLUENZA VAC SPLIT QUAD 0.5 ML IM SUSY
0.5000 mL | PREFILLED_SYRINGE | INTRAMUSCULAR | Status: DC
  Filled 2015-09-15: qty 0.5

## 2015-09-15 MED ORDER — VERAPAMIL HCL 2.5 MG/ML IV SOLN
INTRA_ARTERIAL | Status: DC | PRN
  Administered 2015-09-15: 15 mL via INTRA_ARTERIAL

## 2015-09-15 MED ORDER — SODIUM CHLORIDE 0.9 % WEIGHT BASED INFUSION
1.0000 mL/kg/h | INTRAVENOUS | Status: AC
  Administered 2015-09-15 (×2): 1 mL/kg/h via INTRAVENOUS

## 2015-09-15 MED ORDER — LIDOCAINE HCL (PF) 1 % IJ SOLN
INTRAMUSCULAR | Status: AC
  Filled 2015-09-15: qty 30

## 2015-09-15 MED ORDER — HEPARIN SODIUM (PORCINE) 1000 UNIT/ML IJ SOLN
INTRAMUSCULAR | Status: DC | PRN
  Administered 2015-09-15: 4500 [IU] via INTRAVENOUS

## 2015-09-15 MED ORDER — HEPARIN (PORCINE) IN NACL 100-0.45 UNIT/ML-% IJ SOLN
800.0000 [IU]/h | INTRAMUSCULAR | Status: DC
  Administered 2015-09-16: 800 [IU]/h via INTRAVENOUS
  Administered 2015-09-16: 1150 [IU]/h via INTRAVENOUS
  Administered 2015-09-18: 800 [IU]/h via INTRAVENOUS
  Filled 2015-09-15 (×3): qty 250

## 2015-09-15 MED ORDER — ONDANSETRON HCL 4 MG/2ML IJ SOLN: 4.0000 mg | Freq: Four times a day (QID) | INTRAMUSCULAR | Status: DC | PRN

## 2015-09-15 MED ORDER — TIROFIBAN (AGGRASTAT) BOLUS VIA INFUSION
25.0000 ug/kg | Freq: Once | INTRAVENOUS | Status: AC
  Administered 2015-09-15: 2262.5 ug via INTRAVENOUS
  Filled 2015-09-15: qty 46

## 2015-09-15 MED ORDER — MIDAZOLAM HCL 2 MG/2ML IJ SOLN
INTRAMUSCULAR | Status: DC | PRN
  Administered 2015-09-15: 2 mg via INTRAVENOUS

## 2015-09-15 MED ORDER — PNEUMOCOCCAL VAC POLYVALENT 25 MCG/0.5ML IJ INJ: 0.5000 mL | INJECTION | INTRAMUSCULAR | Status: DC

## 2015-09-15 MED ORDER — IOHEXOL 350 MG/ML SOLN
INTRAVENOUS | Status: DC | PRN
  Administered 2015-09-15: 80 mL via INTRAVENOUS

## 2015-09-15 MED ORDER — FENTANYL CITRATE (PF) 100 MCG/2ML IJ SOLN
INTRAMUSCULAR | Status: DC | PRN
  Administered 2015-09-15: 25 ug via INTRAVENOUS

## 2015-09-15 MED ORDER — SODIUM CHLORIDE 0.9 % IJ SOLN
3.0000 mL | Freq: Two times a day (BID) | INTRAMUSCULAR | Status: DC
  Administered 2015-09-16 (×2): 3 mL via INTRAVENOUS

## 2015-09-15 MED ORDER — SODIUM CHLORIDE 0.9 % IJ SOLN: 3.0000 mL | INTRAMUSCULAR | Status: DC | PRN

## 2015-09-15 MED ORDER — FENTANYL CITRATE (PF) 100 MCG/2ML IJ SOLN
INTRAMUSCULAR | Status: AC
  Filled 2015-09-15: qty 4

## 2015-09-15 MED ORDER — TIROFIBAN HCL IV 5 MG/100ML: 0.1500 ug/kg/min | INTRAVENOUS | Status: DC

## 2015-09-15 MED ORDER — NITROGLYCERIN IN D5W 200-5 MCG/ML-% IV SOLN
10.0000 ug/min | INTRAVENOUS | Status: DC
  Administered 2015-09-15: 10 ug/min via INTRAVENOUS
  Filled 2015-09-15: qty 250

## 2015-09-15 MED ORDER — TIROFIBAN HCL IV 5 MG/100ML
0.1500 ug/kg/min | INTRAVENOUS | Status: DC
  Administered 2015-09-15 – 2015-09-17 (×4): 0.15 ug/kg/min via INTRAVENOUS
  Filled 2015-09-15 (×6): qty 100

## 2015-09-15 MED ORDER — NITROGLYCERIN IN D5W 200-5 MCG/ML-% IV SOLN: 0.0000 ug/min | Freq: Once | INTRAVENOUS | Status: AC

## 2015-09-15 MED ORDER — BIVALIRUDIN 250 MG IV SOLR
INTRAVENOUS | Status: AC
  Filled 2015-09-15: qty 250

## 2015-09-15 MED ORDER — MIDAZOLAM HCL 2 MG/2ML IJ SOLN
INTRAMUSCULAR | Status: AC
  Filled 2015-09-15: qty 4

## 2015-09-15 SURGICAL SUPPLY — 10 items
CATH OPTITORQUE TIG 4.0 5F (CATHETERS) ×3 IMPLANT
DEVICE RAD COMP TR BAND LRG (VASCULAR PRODUCTS) ×5 IMPLANT
GLIDESHEATH SLEND A-KIT 6F 20G (SHEATH) ×3 IMPLANT
GUIDEWIRE ANGLED .035X150CM (WIRE) ×2 IMPLANT
KIT HEART LEFT (KITS) ×3 IMPLANT
PACK CARDIAC CATHETERIZATION (CUSTOM PROCEDURE TRAY) ×3 IMPLANT
SYR CONTROL 10ML ANGIOGRAPHIC (SYRINGE) ×2 IMPLANT
TRANSDUCER W/STOPCOCK (MISCELLANEOUS) ×3 IMPLANT
TUBING CIL FLEX 10 FLL-RA (TUBING) ×3 IMPLANT
WIRE SAFE-T 1.5MM-J .035X260CM (WIRE) ×3 IMPLANT

## 2015-09-15 NOTE — Care Management Note (Addendum)
Case Management Note  Patient Details  Name: Ian Morton MRN: [id number] Date of Birth: 27-May-1951  Subjective/Objective: Pt admitted for Nstemi. Plan for cardiac cath 09-15-15.                    Action/Plan: CM did discuss case with [facility] Liaison Lyn Hollingshead to see if eligible for [facility]. Pt will be eligible for services if needed. CM to monitor for disposition needs.    Expected Discharge Date:                  Expected Discharge Plan:  Home/Self Care  In-House Referral:  NA  Discharge planning Services  CM Consult  Post Acute Care Choice:    Choice offered to:     DME Arranged:    DME Agency:     HH Arranged:    HH Agency:     Status of Service:  In process, will continue to follow  Medicare Important Message Given:    Date Medicare IM Given:    Medicare IM give by:    Date Additional Medicare IM Given:    Additional Medicare Important Message give by:     If discussed at Long Length of Stay Meetings, dates discussed:    Additional Comments:  Gala Lewandowsky, RN 09/15/2015, 11:33 AM

## 2015-09-15 NOTE — Clinical Documentation Improvement (Signed)
Cardiology  Abnormal Lab/Test Results:  On adm Sodium 133, down to 129 .  Thank you   Possible Clinical Conditions associated with below indicators   Hyponatremia  Other Condition  Cannot Clinically Determine   Supporting Information: BMET x 2  Treatment Provided: NS IV @ 50 ml/hr   Please exercise your independent, professional judgment when responding. A specific answer is not anticipated or expected.   Thank You,  Lavonda Jumbo Health Information Management [place] [phone]

## 2015-09-15 NOTE — Progress Notes (Signed)
ANTICOAGULATION CONSULT NOTE - Follow-up  Pharmacy Consult for Heparin and Tirofiban Indication: chest pain/ACS  No Known Allergies  Patient Measurements: Height: 5\' 9"  (175.3 cm) Weight: 199 lb 8.3 oz (90.5 kg) IBW/kg (Calculated) : 70.7  Vital Signs: Temp: 98.4 F (36.9 C) (10/11 1106) Temp Source: Oral (10/11 1106) BP: 122/79 mmHg (10/11 1612) Pulse Rate: 0 (10/11 1617)  Labs:  Recent Labs  09/14/15 1530 09/14/15 2113 09/15/15 0250 09/15/15 0828  HGB 11.2*  --   --   --   HCT 36.2*  --   --   --   PLT 216  --   --   --   APTT 24  --   --   --   LABPROT 13.9  --   --   --   INR 1.05  --   --   --   HEPARINUNFRC  --   --  0.50 0.77*  CREATININE 1.06  --  0.97  --   TROPONINI  --  0.16* 0.12* 0.12*    Estimated Creatinine Clearance: 83.3 mL/min (by C-G formula based on Cr of 0.97).  Medications:  No anticoagulants pta  Assessment: 64yom w/ hx CAD s/p stents presents to the ED with chest pain and diaphoresis. Troponin positive. Pt continues on IV heparin. Heparin level 0.77 (slightly supratherapeutic) on 1250 units/hr. No bleeding noted.   Pharmacy is consulted to dose tirofiban for ACS. Pt has been in the cath lab today. Pt is scheduled for CABG on 10/14. Pt will be continued on tirofiban until CABG.  Goal of Therapy:  Heparin level 0.3-0.7 units/ml Monitor platelets by anticoagulation protocol: Yes   Plan:  Tirofiban 25 mcg/kg load followed by 0.15 mg/kg/min for 18h Continue heparin 1150 units/hr 6h HL Daily HL/CBC Monitor s/sx of bleeding F/u on CABG  Pryce Folts M. Newman Pies, PharmD Clinical Pharmacist Pager [phone] 09/15/2015 4:55 PM

## 2015-09-15 NOTE — Progress Notes (Signed)
UR Completed Eather Chaires Graves-Bigelow, RN,BSN [phone]  

## 2015-09-15 NOTE — Progress Notes (Signed)
ANTICOAGULATION CONSULT NOTE - Follow-up  Pharmacy Consult for Heparin Indication: chest pain/ACS  No Known Allergies  Patient Measurements: Height: 5\' 9"  (175.3 cm) Weight: 199 lb 8.3 oz (90.5 kg) IBW/kg (Calculated) : 70.7  Vital Signs: Temp: 97.9 F (36.6 C) (10/11 0330) Temp Source: Oral (10/11 0330) BP: 140/88 mmHg (10/11 0040) Pulse Rate: 49 (10/11 0040)  Labs:  Recent Labs  09/14/15 1530 09/14/15 2113 09/15/15 0250  HGB 11.2*  --   --   HCT 36.2*  --   --   PLT 216  --   --   APTT 24  --   --   LABPROT 13.9  --   --   INR 1.05  --   --   HEPARINUNFRC  --   --  0.50  CREATININE 1.06  --   --   TROPONINI  --  0.16*  --     Estimated Creatinine Clearance: 76.2 mL/min (by C-G formula based on Cr of 1.06).  Medications:  No anticoagulants pta  Assessment: Ian Morton w/ hx CAD s/p stents presents to the ED with chest pain and diaphoresis. Troponin positive x 2 and rising (0.10-->0.19). He continues on IV heparin. Initial heparin level is therapeutic at 0.5. No bleeding or other problems noted.   Goal of Therapy:  Heparin level 0.3-0.7 units/ml Monitor platelets by anticoagulation protocol: Yes   Plan:  - Continue heparin gtt at 1250 units/hr - Check a 6 hour heparin level to confirm dosing  Lysle Pearl, PharmD, BCPS Pager # [phone] 09/15/2015 3:33 AM

## 2015-09-15 NOTE — Interval H&P Note (Signed)
History and Physical Interval Note:  09/15/2015 3:15 PM  Kathan Irwin  has presented today for surgery, with the diagnosis of cp  The various methods of treatment have been discussed with the patient and family. After consideration of risks, benefits and other options for treatment, the patient has consented to  Procedure(s): Left Heart Cath and Coronary Angiography (N/A) and possible PCI as a surgical intervention .  The patient's history has been reviewed, patient examined, no change in status, stable for surgery.  I have reviewed the patient's chart and labs.  Questions were answered to the patient's satisfaction.   Cath Lab Visit (complete for each Cath Lab visit)  Clinical Evaluation Leading to the Procedure:   ACS: Yes.    Non-ACS:    Anginal Classification: CCS IV  Anti-ischemic medical therapy: Maximal Therapy (2 or more classes of medications)  Non-Invasive Test Results: No non-invasive testing performed  Prior CABG: No previous CABG        Yates Decamp
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
## 2015-09-15 NOTE — Progress Notes (Signed)
ANTICOAGULATION CONSULT NOTE - Follow-up  Pharmacy Consult for Heparin Indication: chest pain/ACS  No Known Allergies  Patient Measurements: Height: 5\' 9"  (175.3 cm) Weight: 199 lb 8.3 oz (90.5 kg) IBW/kg (Calculated) : 70.7  Vital Signs: Temp: 97.7 F (36.5 C) (10/11 0716) Temp Source: Oral (10/11 0716) BP: 131/71 mmHg (10/11 0440) Pulse Rate: 72 (10/11 0809)  Labs:  Recent Labs  09/14/15 1530 09/14/15 2113 09/15/15 0250 09/15/15 0828  HGB 11.2*  --   --   --   HCT 36.2*  --   --   --   PLT 216  --   --   --   APTT 24  --   --   --   LABPROT 13.9  --   --   --   INR 1.05  --   --   --   HEPARINUNFRC  --   --  0.50 0.77*  CREATININE 1.06  --  0.97  --   TROPONINI  --  0.16* 0.12* 0.12*    Estimated Creatinine Clearance: 83.3 mL/min (by C-G formula based on Cr of 0.97).  Medications:  No anticoagulants pta  Assessment: 64yom w/ hx CAD s/p stents presents to the ED with chest pain and diaphoresis. Troponin positive. Pt continues on IV heparin. Heparin level 0.77 (slightly supratherapeutic) on 1250 units/hr. No bleeding noted. Plan for cath today at 1300.   Goal of Therapy:  Heparin level 0.3-0.7 units/ml Monitor platelets by anticoagulation protocol: Yes   Plan:  - Decrease heparin to 1150 units/hr - F/u post cath  Christoper Fabian, PharmD, BCPS Clinical pharmacist, pager [phone]  09/15/2015 11:01 AM

## 2015-09-15 NOTE — Progress Notes (Signed)
Patient states that he does not want to contact his wife that she wants a divorce. He states that he lives at home with her and two children. He states that  the can go back to his home to live.
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
## 2015-09-15 NOTE — Progress Notes (Signed)
Patient ask that I call wife to let her know that he needs to have bypass surgery. I ask patient if he would like to call and tell her and he states no she will not listen to me. I called the home and spoke to a young man Mrs. Park Breed was not at home states that she will be back in 2 hours. I left a name and number to call, no information given.

## 2015-09-16 ENCOUNTER — Encounter (HOSPITAL_COMMUNITY): Payer: Self-pay | Admitting: Cardiology

## 2015-09-16 ENCOUNTER — Inpatient Hospital Stay (HOSPITAL_COMMUNITY): Payer: Medicare Other

## 2015-09-16 LAB — CBC
HCT: 35.8 % — ABNORMAL LOW (ref 39.0–52.0)
Hemoglobin: 11 g/dL — ABNORMAL LOW (ref 13.0–17.0)
MCH: 24.3 pg — AB (ref 26.0–34.0)
MCHC: 30.7 g/dL (ref 30.0–36.0)
MCV: 79 fL (ref 78.0–100.0)
PLATELETS: 205 10*3/uL (ref 150–400)
RBC: 4.53 MIL/uL (ref 4.22–5.81)
RDW: 15 % (ref 11.5–15.5)
WBC: 9.9 10*3/uL (ref 4.0–10.5)

## 2015-09-16 LAB — BASIC METABOLIC PANEL
ANION GAP: 7 (ref 5–15)
BUN: 15 mg/dL (ref 6–20)
CHLORIDE: 99 mmol/L — AB (ref 101–111)
CO2: 25 mmol/L (ref 22–32)
Calcium: 8.7 mg/dL — ABNORMAL LOW (ref 8.9–10.3)
Creatinine, Ser: 1.09 mg/dL (ref 0.61–1.24)
GLUCOSE: 195 mg/dL — AB (ref 65–99)
Potassium: 4.1 mmol/L (ref 3.5–5.1)
Sodium: 131 mmol/L — ABNORMAL LOW (ref 135–145)

## 2015-09-16 LAB — HEPARIN LEVEL (UNFRACTIONATED)
Heparin Unfractionated: 0.59 IU/mL (ref 0.30–0.70)
Heparin Unfractionated: 0.81 IU/mL — ABNORMAL HIGH (ref 0.30–0.70)

## 2015-09-16 LAB — HEMOGLOBIN A1C
Hgb A1c MFr Bld: 8.1 % — ABNORMAL HIGH (ref 4.8–5.6)
Mean Plasma Glucose: 186 mg/dL

## 2015-09-16 MED ORDER — FUROSEMIDE 10 MG/ML IJ SOLN
40.0000 mg | Freq: Two times a day (BID) | INTRAMUSCULAR | Status: AC
  Administered 2015-09-16 (×2): 40 mg via INTRAVENOUS
  Filled 2015-09-16 (×2): qty 4

## 2015-09-16 MED ORDER — POTASSIUM CHLORIDE CRYS ER 20 MEQ PO TBCR
20.0000 meq | EXTENDED_RELEASE_TABLET | Freq: Two times a day (BID) | ORAL | Status: AC
  Administered 2015-09-16 (×2): 20 meq via ORAL
  Filled 2015-09-16 (×2): qty 1

## 2015-09-16 NOTE — Progress Notes (Addendum)
Subjective:  No further chest pain. Doing well.   Objective:  Vital Signs in the last 24 hours: Temp:  [97.5 F (36.4 C)-98.4 F (36.9 C)] 97.6 F (36.4 C) (10/12 0724) Pulse Rate:  [0-79] 63 (10/12 1003) Resp:  [0-37] 19 (10/12 0900) BP: (108-175)/(57-98) 129/93 mmHg (10/12 1003) SpO2:  [0 %-100 %] 93 % (10/12 0808)  Intake/Output from previous day: 10/11 0701 - 10/12 0700 In: 332.9 [I.V.:332.9] Out: 1190 [Urine:1190]  Physical Exam:   General appearance: alert, appears stated age and no distress Eyes: negative findings: lids and lashes normal Neck: no carotid bruit, no JVD, supple, symmetrical, trachea midline and thyroid not enlarged, symmetric, no tenderness/mass/nodules Neck: JVP - normal, carotids 2+= without bruits Resp: rales bibasilar and bilaterally Chest wall: no tenderness Cardio: regular rate and rhythm, S1, S2 normal, no murmur, click, rub or gallop GI: soft, non-tender; bowel sounds normal; no masses,  no organomegaly Extremities: extremities normal, atraumatic, no cyanosis or edema  Lab Results: BMP  Recent Labs  09/14/15 1530 09/15/15 0250  NA 133* 129*  K 4.2 4.3  CL 99* 96*  CO2 27 26  GLUCOSE 267* 201*  BUN 14 13  CREATININE 1.06 0.97  CALCIUM 8.9 8.3*  GFRNONAA >60 >60  GFRAA >60 >60    CBC  Recent Labs Lab 09/16/15 0259  WBC 9.9  RBC 4.53  HGB 11.0*  HCT 35.8*  PLT 205  MCV 79.0  MCH 24.3*  MCHC 30.7  RDW 15.0    HEMOGLOBIN A1C Lab Results  Component Value Date   HGBA1C 8.1* 09/15/2015   MPG 186 09/15/2015    Cardiac Panel (last 3 results)  Recent Labs  09/14/15 2113 09/15/15 0250 09/15/15 0828  TROPONINI 0.16* 0.12* 0.12*    BNP (last 3 results) No results for input(s): PROBNP in the last 8760 hours.  TSH  Recent Labs  09/14/15 2113  TSH 3.387    CHOLESTEROL  Recent Labs  09/15/15 0250  CHOL 153    Hepatic Function Panel  Recent Labs  09/14/15 1530  PROT 6.2*  ALBUMIN 3.3*  AST 30   ALT 27  ALKPHOS 72  BILITOT 0.1*  BILIDIR <0.1*  IBILI NOT CALCULATED    Cardiac Studies:  EKG 09/16/2015: NSR, TWI in anterior leads and T flattening lateral leads new from 09/15/2015.  Assessment/Plan:  1. NSTEMI 2. CAD native vessel. H/O           3.5x15 Xience stent implanted in prox. LAD and angioplasty of first diagonal on 04/01/2008 at [facility] grade 95% stenosis ostial LAD for 1 vessel CABG probably Friday due to plavix on board 3. DM controlled 4. Hyponatremia probably stress induced and volume overload. 5. Acute on chronic diastolic heart failure. 4. Hyperlipidemia  Rec: One dose Lasix now and in the afternoon and reassess tomorrow.  Continue Integrellin and ASA. Plavix on hold for CABG Yates Decamp, M.D. 09/16/2015, 10:36 AM [facility] Pager: [phone] Office: [phone] If no answer: [phone]

## 2015-09-16 NOTE — Progress Notes (Signed)
ANTICOAGULATION CONSULT NOTE - Follow Up Consult  Pharmacy Consult for Heparin/Aggrastat Indication: CAD  No Known Allergies  Patient Measurements: Height: 5\' 9"  (175.3 cm) Weight: 199 lb 8.3 oz (90.5 kg) IBW/kg (Calculated) : 70.7 Heparin Dosing Weight: 89 kg  Vital Signs: Temp: 97.8 F (36.6 C) (10/12 1100) Temp Source: Oral (10/12 1100) BP: 103/59 mmHg (10/12 1800) Pulse Rate: 63 (10/12 1003)  Labs:  Recent Labs  09/14/15 1530 09/14/15 2113  09/15/15 0250 09/15/15 0828 09/16/15 0259 09/16/15 0925 09/16/15 1057 09/16/15 1854  HGB 11.2*  --   --   --   --  11.0*  --   --   --   HCT 36.2*  --   --   --   --  35.8*  --   --   --   PLT 216  --   --   --   --  205  --   --   --   APTT 24  --   --   --   --   --   --   --   --   LABPROT 13.9  --   --   --   --   --   --   --   --   INR 1.05  --   --   --   --   --   --   --   --   HEPARINUNFRC  --   --   < > 0.50 0.77*  --  0.59  --  0.81*  CREATININE 1.06  --   --  0.97  --   --   --  1.09  --   TROPONINI  --  0.16*  --  0.12* 0.12*  --   --   --   --   < > = values in this interval not displayed.  Estimated Creatinine Clearance: 74.1 mL/min (by C-G formula based on Cr of 1.09).   Assessment: 64yom with hx CAD s/p stents presents to ED with cp and diaphoresis. First two troponins positive. He will begin IV heparin.  PMH: CAD s/p stents, DM, HLD, GERD, HTN, vertigo  Anticoagulation: Heparin for r/o ACS. Trop peak 0.16. Heparin level 0.59 slightly greater than goal while on Aggrastat (con't until surgery per Dr. Jacinto Halim). Hgb low 11 but stable overnight.  F/u HL is supratherapeutic at 0.81 on heparin 1050 units/hr. Nurse reports no issues with infusion or bleeding.  Goal of Therapy:  HL 0.3-0.5 Monitor platelets by anticoagulation protocol: Yes   Plan:  Tirofiban 0.15 mcg/kg/min  Decrease heparin to 800 units/hr 6h HL Daily HL/CBC CABG Thurs/Fri.   Arlean Hopping. Newman Pies, PharmD Clinical Pharmacist Pager  [phone] 09/16/2015,7:44 PM

## 2015-09-16 NOTE — Progress Notes (Signed)
ANTICOAGULATION CONSULT NOTE - Follow Up Consult  Pharmacy Consult for Heparin/Aggrastat Indication: CAD  No Known Allergies  Patient Measurements: Height: 5\' 9"  (175.3 cm) Weight: 199 lb 8.3 oz (90.5 kg) IBW/kg (Calculated) : 70.7 Heparin Dosing Weight: 89 kg  Vital Signs: Temp: 97.8 F (36.6 C) (10/12 1100) Temp Source: Oral (10/12 1100) BP: 133/80 mmHg (10/12 1100) Pulse Rate: 63 (10/12 1003)  Labs:  Recent Labs  09/14/15 1530 09/14/15 2113 09/15/15 0250 09/15/15 0828 09/16/15 0259 09/16/15 0925 09/16/15 1057  HGB 11.2*  --   --   --  11.0*  --   --   HCT 36.2*  --   --   --  35.8*  --   --   PLT 216  --   --   --  205  --   --   APTT 24  --   --   --   --   --   --   LABPROT 13.9  --   --   --   --   --   --   INR 1.05  --   --   --   --   --   --   HEPARINUNFRC  --   --  0.50 0.77*  --  0.59  --   CREATININE 1.06  --  0.97  --   --   --  1.09  TROPONINI  --  0.16* 0.12* 0.12*  --   --   --     Estimated Creatinine Clearance: 74.1 mL/min (by C-G formula based on Cr of 1.09).   Assessment: 64yom with hx CAD s/p stents presents to ED with cp and diaphoresis. First two troponins positive. He will begin IV heparin.  PMH: CAD s/p stents, DM, HLD, GERD, HTN, vertigo  Anticoagulation: Heparin for r/o ACS. Trop peak 0.16. Heparin level 0.59 slightly greater than goal while on Aggrastat (con't until surgery per Dr. Jacinto Halim). Hgb low 11 but stable overnight.  Goal of Therapy:  HL 0.3-0.5 Monitor platelets by anticoagulation protocol: Yes   Plan:  Tirofiban 0.15 mcg/kg/min  Decrease IV heparin to 1050 units/hr. Recheck in 6 hrs. CABG Thurs/Fri.   Alayssa Flinchum S. Merilynn Finland, PharmD, BCPS Clinical Staff Pharmacist Pager [phone]  Misty Stanley Stillinger 09/16/2015,12:48 PM

## 2015-09-16 NOTE — Progress Notes (Signed)
  Echocardiogram 2D Echocardiogram has been performed.  Cathie Beams 09/16/2015, 1:52 PM

## 2015-09-16 NOTE — Progress Notes (Signed)
1 Day Post-Op Procedure(s) (LRB): Left Heart Cath and Coronary Angiography (N/A) Subjective:  No chest pain but got short of breath getting up to bedside commode.  Objective: Vital signs in last 24 hours: Temp:  [97.6 F (36.4 C)-99.1 F (37.3 C)] 99.1 F (37.3 C) (10/12 1948) Pulse Rate:  [61-79] 66 (10/12 1948) Cardiac Rhythm:  [-] Normal sinus rhythm (10/12 0745) Resp:  [13-28] 20 (10/12 1948) BP: (103-175)/(57-98) 117/77 mmHg (10/12 1948) SpO2:  [93 %-99 %] 96 % (10/12 1948)  Hemodynamic parameters for last 24 hours:    Intake/Output from previous day: 10/11 0701 - 10/12 0700 In: 332.9 [I.V.:332.9] Out: 1190 [Urine:1190] Intake/Output this shift:    General appearance: alert and cooperative Heart: regular rate and rhythm, S1, S2 normal, no murmur, click, rub or gallop Lungs: clear to auscultation bilaterally  Lab Results:  Recent Labs  09/14/15 1530 09/16/15 0259  WBC 8.9 9.9  HGB 11.2* 11.0*  HCT 36.2* 35.8*  PLT 216 205   BMET:  Recent Labs  09/15/15 0250 09/16/15 1057  NA 129* 131*  K 4.3 4.1  CL 96* 99*  CO2 26 25  GLUCOSE 201* 195*  BUN 13 15  CREATININE 0.97 1.09  CALCIUM 8.3* 8.7*    PT/INR:  Recent Labs  09/14/15 1530  LABPROT 13.9  INR 1.05   ABG    Component Value Date/Time   TCO2 30 06/10/2014 0049   CBG (last 3)   Recent Labs  09/15/15 0718 09/15/15 1104 09/15/15 2129  GLUCAP 130* 133* 110*    Assessment/Plan: High grade ostial LAD stenosis. His P2Y12 level was 78 this am indicating that he has significant platelet inhibition. I will check it again tomorrow morning. I will plan to do surgery Friday am. I don't think it is wise to wait longer than that even if platelet inhibition is significant. Continue heparin and integrelin.   LOS: 2 days    Alleen Borne 09/16/2015

## 2015-09-17 ENCOUNTER — Encounter (HOSPITAL_COMMUNITY): Payer: Self-pay | Admitting: Certified Registered Nurse Anesthetist

## 2015-09-17 LAB — BASIC METABOLIC PANEL
ANION GAP: 9 (ref 5–15)
BUN: 21 mg/dL — ABNORMAL HIGH (ref 6–20)
CO2: 28 mmol/L (ref 22–32)
Calcium: 9 mg/dL (ref 8.9–10.3)
Chloride: 96 mmol/L — ABNORMAL LOW (ref 101–111)
Creatinine, Ser: 1.27 mg/dL — ABNORMAL HIGH (ref 0.61–1.24)
GFR calc non Af Amer: 57 mL/min — ABNORMAL LOW (ref >60)
Glucose, Bld: 141 mg/dL — ABNORMAL HIGH (ref 65–99)
POTASSIUM: 4.3 mmol/L (ref 3.5–5.1)
SODIUM: 133 mmol/L — AB (ref 135–145)

## 2015-09-17 LAB — TYPE AND SCREEN
ABO/RH(D): A POS
ANTIBODY SCREEN: NEGATIVE

## 2015-09-17 LAB — HEPARIN LEVEL (UNFRACTIONATED)
HEPARIN UNFRACTIONATED: 0.44 [IU]/mL (ref 0.30–0.70)
HEPARIN UNFRACTIONATED: 0.4 [IU]/mL (ref 0.30–0.70)

## 2015-09-17 LAB — CBC
HCT: 38.3 % — ABNORMAL LOW (ref 39.0–52.0)
HEMOGLOBIN: 12.1 g/dL — AB (ref 13.0–17.0)
MCH: 24.6 pg — ABNORMAL LOW (ref 26.0–34.0)
MCHC: 31.6 g/dL (ref 30.0–36.0)
MCV: 77.8 fL — ABNORMAL LOW (ref 78.0–100.0)
PLATELETS: 227 10*3/uL (ref 150–400)
RBC: 4.92 MIL/uL (ref 4.22–5.81)
RDW: 15.3 % (ref 11.5–15.5)
WBC: 10.5 10*3/uL (ref 4.0–10.5)

## 2015-09-17 MED ORDER — SODIUM CHLORIDE 0.9 % IV SOLN
INTRAVENOUS | Status: DC
  Filled 2015-09-17: qty 40

## 2015-09-17 MED ORDER — DEXTROSE 5 % IV SOLN
1.5000 g | INTRAVENOUS | Status: DC
  Filled 2015-09-17: qty 1.5

## 2015-09-17 MED ORDER — SODIUM CHLORIDE 0.9 % IJ SOLN
3.0000 mL | Freq: Two times a day (BID) | INTRAMUSCULAR | Status: DC
Start: 2015-09-17 — End: 2015-09-18
  Administered 2015-09-17 (×2): 3 mL via INTRAVENOUS

## 2015-09-17 MED ORDER — LEVOFLOXACIN 500 MG PO TABS
500.0000 mg | ORAL_TABLET | Freq: Every day | ORAL | Status: AC
  Administered 2015-09-19 – 2015-09-20 (×2): 500 mg via ORAL
  Filled 2015-09-17 (×3): qty 1

## 2015-09-17 MED ORDER — SODIUM CHLORIDE 0.9 % IV SOLN
INTRAVENOUS | Status: DC
  Filled 2015-09-17: qty 30

## 2015-09-17 MED ORDER — PLASMA-LYTE 148 IV SOLN
INTRAVENOUS | Status: DC
  Filled 2015-09-17: qty 2.5

## 2015-09-17 MED ORDER — PHENYLEPHRINE HCL 10 MG/ML IJ SOLN
30.0000 ug/min | INTRAVENOUS | Status: DC
  Filled 2015-09-17: qty 2

## 2015-09-17 MED ORDER — DOPAMINE-DEXTROSE 3.2-5 MG/ML-% IV SOLN
0.0000 ug/kg/min | INTRAVENOUS | Status: DC
  Filled 2015-09-17: qty 250

## 2015-09-17 MED ORDER — CHLORHEXIDINE GLUCONATE CLOTH 2 % EX PADS
6.0000 | MEDICATED_PAD | Freq: Once | CUTANEOUS | Status: AC
  Administered 2015-09-17: 6 via TOPICAL

## 2015-09-17 MED ORDER — TEMAZEPAM 15 MG PO CAPS: 15.0000 mg | ORAL_CAPSULE | Freq: Once | ORAL | Status: DC | PRN

## 2015-09-17 MED ORDER — SODIUM CHLORIDE 0.9 % IV SOLN: INTRAVENOUS | Status: DC | PRN

## 2015-09-17 MED ORDER — SODIUM CHLORIDE 0.9 % IV SOLN
INTRAVENOUS | Status: DC
  Filled 2015-09-17: qty 2.5

## 2015-09-17 MED ORDER — ALPRAZOLAM 0.25 MG PO TABS: 0.2500 mg | ORAL_TABLET | ORAL | Status: DC | PRN

## 2015-09-17 MED ORDER — METOPROLOL TARTRATE 12.5 MG HALF TABLET: 12.5000 mg | ORAL_TABLET | Freq: Once | ORAL | Status: DC

## 2015-09-17 MED ORDER — EPINEPHRINE HCL 1 MG/ML IJ SOLN
0.0000 ug/min | INTRAVENOUS | Status: DC
  Filled 2015-09-17: qty 4

## 2015-09-17 MED ORDER — DEXMEDETOMIDINE HCL IN NACL 400 MCG/100ML IV SOLN
0.1000 ug/kg/h | INTRAVENOUS | Status: DC
  Filled 2015-09-17: qty 100

## 2015-09-17 MED ORDER — VANCOMYCIN HCL 10 G IV SOLR
1500.0000 mg | INTRAVENOUS | Status: DC
  Filled 2015-09-17: qty 1500

## 2015-09-17 MED ORDER — MAGNESIUM SULFATE 50 % IJ SOLN
40.0000 meq | INTRAMUSCULAR | Status: DC
  Filled 2015-09-17: qty 10

## 2015-09-17 MED ORDER — CEFUROXIME SODIUM 750 MG IJ SOLR
750.0000 mg | INTRAMUSCULAR | Status: DC
  Filled 2015-09-17: qty 750

## 2015-09-17 MED ORDER — POTASSIUM CHLORIDE 2 MEQ/ML IV SOLN
80.0000 meq | INTRAVENOUS | Status: DC
  Filled 2015-09-17: qty 40

## 2015-09-17 MED ORDER — DIAZEPAM 5 MG PO TABS
5.0000 mg | ORAL_TABLET | Freq: Once | ORAL | Status: AC
  Administered 2015-09-18: 5 mg via ORAL
  Filled 2015-09-17: qty 1

## 2015-09-17 MED ORDER — BISACODYL 5 MG PO TBEC: 5.0000 mg | DELAYED_RELEASE_TABLET | Freq: Once | ORAL | Status: DC

## 2015-09-17 MED ORDER — AMLODIPINE BESYLATE 5 MG PO TABS
5.0000 mg | ORAL_TABLET | Freq: Every day | ORAL | Status: DC
  Administered 2015-09-17: 5 mg via ORAL
  Filled 2015-09-17: qty 1

## 2015-09-17 MED ORDER — CHLORHEXIDINE GLUCONATE 0.12 % MT SOLN
15.0000 mL | Freq: Once | OROMUCOSAL | Status: AC
  Administered 2015-09-18: 15 mL via OROMUCOSAL

## 2015-09-17 MED ORDER — NITROGLYCERIN IN D5W 200-5 MCG/ML-% IV SOLN
2.0000 ug/min | INTRAVENOUS | Status: DC
  Filled 2015-09-17: qty 250

## 2015-09-17 NOTE — Progress Notes (Signed)
ANTICOAGULATION CONSULT NOTE - Follow Up Consult  Pharmacy Consult for heparin Indication: CAD awaiting O[facility]    Labs:  Recent Labs  09/14/15 1530 09/14/15 2113  09/15/15 0250 09/15/15 0828 09/16/15 0259 09/16/15 0925 09/16/15 1057 09/16/15 1854 09/17/15 0300  HGB 11.2*  --   --   --   --  11.0*  --   --   --  12.1*  HCT 36.2*  --   --   --   --  35.8*  --   --   --  38.3*  PLT 216  --   --   --   --  205  --   --   --  227  APTT 24  --   --   --   --   --   --   --   --   --   LABPROT 13.9  --   --   --   --   --   --   --   --   --   INR 1.05  --   --   --   --   --   --   --   --   --   HEPARINUNFRC  --   --   < > 0.50 0.77*  --  0.59  --  0.81* 0.44  CREATININE 1.06  --   --  0.97  --   --   --  1.09  --   --   TROPONINI  --  0.16*  --  0.12* 0.12*  --   --   --   --   --   < > = values in this interval not displayed.   Assessment/Plan:  64yo male therapeutic on heparin after rate change. Will continue gtt at current rate and confirm stable with additional level.   Vernard Gambles, PharmD, BCPS  09/17/2015,4:15 AM

## 2015-09-17 NOTE — Progress Notes (Signed)
ANTICOAGULATION CONSULT NOTE - Follow Up Consult  Pharmacy Consult for Heparin Indication: CAD  No Known Allergies  Patient Measurements: Height: 5\' 9"  (175.3 cm) Weight: 199 lb 8.3 oz (90.5 kg) IBW/kg (Calculated) : 70.7 Heparin Dosing Weight: 89 kg  Vital Signs: Temp: 97.5 F (36.4 C) (10/13 1119) Temp Source: Oral (10/13 1119) BP: 129/66 mmHg (10/13 1000) Pulse Rate: 61 (10/13 0951)  Labs:  Recent Labs  09/14/15 1530 09/14/15 2113  09/15/15 0250 09/15/15 0828 09/16/15 0259  09/16/15 1057 09/16/15 1854 09/17/15 0300 09/17/15 0928  HGB 11.2*  --   --   --   --  11.0*  --   --   --  12.1*  --   HCT 36.2*  --   --   --   --  35.8*  --   --   --  38.3*  --   PLT 216  --   --   --   --  205  --   --   --  227  --   APTT 24  --   --   --   --   --   --   --   --   --   --   LABPROT 13.9  --   --   --   --   --   --   --   --   --   --   INR 1.05  --   --   --   --   --   --   --   --   --   --   HEPARINUNFRC  --   --   < > 0.50 0.77*  --   < >  --  0.81* 0.44 0.40  CREATININE 1.06  --   --  0.97  --   --   --  1.09  --  1.27*  --   TROPONINI  --  0.16*  --  0.12* 0.12*  --   --   --   --   --   --   < > = values in this interval not displayed.  Estimated Creatinine Clearance: 63.6 mL/min (by C-G formula based on Cr of 1.27).   Assessment: 64yom with hx CAD s/p stents presents to ED with cp and diaphoresis. He is now s/p cath with high grade 95% stenosis ostial LAD for 1 vessel CABG on Friday. P2Y12 assay was 78 on 10/11 and this was after tirofiban was started (patient was also noted on plavix PTA with last dose on 10/11).  Spoke to Dr. Jacinto Halim today and will discontinue tirofiban (done this am).  Noted for CABG 10/14 -heparin level confirmed at 0.4 and at goal   Goal of Therapy:  Heparin level 0.3-0.7 Monitor platelets by anticoagulation protocol: Yes   Plan:  -No heparin changes needed -Discontinue tirofiban -CABG in am  Harland German, Pharm D 09/17/2015 11:31  AM

## 2015-09-17 NOTE — Progress Notes (Signed)
Subjective:  No further chest pain. SOB stable but no significant improvement. Otherwise doing well.   Objective:  Vital Signs in the last 24 hours: Temp:  [97.8 F (36.6 C)-99.1 F (37.3 C)] 99 F (37.2 C) (10/13 0400) Pulse Rate:  [61-67] 67 (10/12 2055) Resp:  [13-22] 20 (10/13 0600) BP: (83-146)/(52-93) 113/74 mmHg (10/13 0600) SpO2:  [93 %-98 %] 97 % (10/13 0400)  Intake/Output from previous day: 10/12 0701 - 10/13 0700 In: 1113.9 [I.V.:1113.9] Out: 2150 [Urine:2150]  Physical Exam: General appearance: alert, appears stated age and no distress Eyes: negative findings: lids and lashes normal Neck: no carotid bruit, no JVD, supple, symmetrical, trachea midline and thyroid not enlarged, symmetric, no tenderness/mass/nodules Resp: rales bibasilar Chest wall: no tenderness Cardio: regular rate and rhythm, S1, S2 normal, no murmur, click, rub or gallop GI: soft, non-tender; bowel sounds normal; no masses,  no organomegaly Extremities: extremities normal, atraumatic, no cyanosis or edema  Lab Results: BMP  Recent Labs  09/15/15 0250 09/16/15 1057 09/17/15 0300  NA 129* 131* 133*  K 4.3 4.1 4.3  CL 96* 99* 96*  CO2 GLUCOSE 201* 195* 141*  BUN 13 15 21*  CREATININE 0.97 1.09 1.27*  CALCIUM 8.3* 8.7* 9.0  GFRNONAA >60 >60 57*  GFRAA >60 >60 >60    CBC  Recent Labs Lab 09/17/15 0300  WBC 10.5  RBC 4.92  HGB 12.1*  HCT 38.3*  PLT 227  MCV 77.8*  MCH 24.6*  MCHC 31.6  RDW 15.3    HEMOGLOBIN A1C Lab Results  Component Value Date   HGBA1C 8.1* 09/15/2015   MPG 186 09/15/2015    Cardiac Panel (last 3 results)  Recent Labs  09/14/15 2113 09/15/15 0250 09/15/15 0828  TROPONINI 0.16* 0.12* 0.12*    TSH  Recent Labs  09/14/15 2113  TSH 3.387    CHOLESTEROL  Recent Labs  09/15/15 0250  CHOL 153    Hepatic Function Panel  Recent Labs  09/14/15 1530  PROT 6.2*  ALBUMIN 3.3*  AST 30  ALT 27  ALKPHOS 72  BILITOT 0.1*   BILIDIR <0.1*  IBILI NOT CALCULATED    Cardiac Studies:  EKG 09/16/2015: NSR, TWI in anterior leads and T flattening lateral leads new from 09/15/2015.  Echocardiogram 09/16/2015:  Left ventricle: The cavity size was normal. There was mild concentric hypertrophy. Systolic function was normal. Theestimated ejection fraction was in the range of 50% to 55%. Hypokinesis of the apical myocardium. Doppler parameters are consistent with abnormal left ventricular relaxation (grade 1 diastolic dysfunction). Aortic valve: Normal-sized, mildly calcified annulus. Trileaflet;mildly thickened leaflets. There was trivial regurgitation. Atrial septum: No defect or patent foramen ovale was identified.  Assessment/Plan:  1. NSTEMI 2. CAD native vessel. H/O          3.5x15 Xience stent implanted in prox. LAD and angioplasty of first diagonal on 04/01/2008 at [facility] grade 95% stenosis ostial LAD for 1 vessel CABG probably Friday due to plavix on board 3. DM controlled 4. Hyponatremia probably stress induced and volume overload. 5. Acute on chronic diastolic heart failure. 4. Hyperlipidemia  Rec: CABG still planned for tomorrow am.  Bibasilar crackles still present, normal EF and no evidence of pulmonary hypertension by echo.    Erling Conte, NP-C 09/17/2015, 8:01 AM [facility] Pager: [phone] Office: [phone]  Will avoid any further diuresis for now, S. Cr trending up. Hold lisinopril. BP up will add amlodipine.  Check platelet inhibition today. I have personally reviewed the patient's record and performed physical exam and agree with the assessment and plan of Ms. Marcy Salvo, NP-C.  Yates Decamp, MD 09/17/2015, 8:57 AM [facility] Pager: [phone] Office: [phone] If no answer: Cell:  [phone]

## 2015-09-17 NOTE — Progress Notes (Signed)
2 Days Post-Op Procedure(s) (LRB): Left Heart Cath and Coronary Angiography (N/A) Subjective: Some mild right sided chest pain  Objective: Vital signs in last 24 hours: Temp:  [97.5 F (36.4 C)-99.1 F (37.3 C)] 97.5 F (36.4 C) (10/13 1119) Pulse Rate:  [61-67] 61 (10/13 0951) Cardiac Rhythm:  [-] Normal sinus rhythm (10/13 0800) Resp:  [14-24] 19 (10/13 1400) BP: (83-146)/(52-93) 117/65 mmHg (10/13 1400) SpO2:  [96 %-98 %] 96 % (10/13 0902)  Hemodynamic parameters for last 24 hours:    Intake/Output from previous day: 10/12 0701 - 10/13 0700 In: 1161.2 [I.V.:1161.2] Out: 2150 [Urine:2150] Intake/Output this shift: Total I/O In: 766.2 [P.O.:600; I.V.:166.2] Out: 1000 [Urine:1000]  General appearance: alert and cooperative Lungs: clear to auscultation bilaterally Heart: RRR no murmur Abdomen: soft, non-tender; bowel sounds normal; no masses,  no organomegaly  Lab Results:  Recent Labs  09/16/15 0259 09/17/15 0300  WBC 9.9 10.5  HGB 11.0* 12.1*  HCT 35.8* 38.3*  PLT 205 227   BMET:  Recent Labs  09/16/15 1057 09/17/15 0300  NA 131* 133*  K 4.1 4.3  CL 99* 96*  CO2 25 28  GLUCOSE 195* 141*  BUN 15 21*  CREATININE 1.09 1.27*  CALCIUM 8.7* 9.0    PT/INR:  Recent Labs  09/14/15 1530  LABPROT 13.9  INR 1.05   ABG    Component Value Date/Time   TCO2 30 06/10/2014 0049   CBG (last 3)   Recent Labs  09/15/15 0718 09/15/15 1104 09/15/15 2129  GLUCAP 130* 133* 110*    Assessment/Plan: High grade ostial LAD stenosis. Notified by pharmacy that his P2Y12 was drawn after starting on Integrelin so it is inaccurate and useless. Do not need to check this again. Plan off-pump CABG tomorrow. Patient has no further questions.   LOS: 3 days    Alleen Borne 09/17/2015

## 2015-09-17 NOTE — Progress Notes (Signed)
Discussed sternal precautions, IS and use, mobility and d/c planning. Pt does not have anyone to care for him at d/c (lives with his wife but she is asking for a divorce and does not want to speak to him). Sts he is interested in SNF at d/c but is also interested in finding a place to live. Social work already consulted. Set up preop video. Gave OHS booklet and guideline however pt sts he has a hard time comprehending books/information since his MI.  1500-1530 Ethelda Chick CES, ACSM 3:31 PM 09/17/2015

## 2015-09-18 ENCOUNTER — Inpatient Hospital Stay (HOSPITAL_COMMUNITY): Payer: Medicare Other

## 2015-09-18 ENCOUNTER — Inpatient Hospital Stay (HOSPITAL_COMMUNITY): Payer: Medicare Other | Admitting: Certified Registered Nurse Anesthetist

## 2015-09-18 ENCOUNTER — Encounter (HOSPITAL_COMMUNITY)
Admission: EM | Disposition: A | Discharge status: Skilled Nursing Facility | Payer: Medicare Other | Source: Home / Self Care | Attending: Surgery

## 2015-09-18 DIAGNOSIS — I2510 Atherosclerotic heart disease of native coronary artery without angina pectoris: Secondary | ICD-10-CM

## 2015-09-18 DIAGNOSIS — Z951 Presence of aortocoronary bypass graft: Secondary | ICD-10-CM

## 2015-09-18 HISTORY — PX: TEE WITHOUT CARDIOVERSION: SHX5443

## 2015-09-18 HISTORY — PX: CORONARY ARTERY BYPASS GRAFT: SHX141

## 2015-09-18 LAB — POCT I-STAT, CHEM 8
BUN: 22 mg/dL — ABNORMAL HIGH (ref 6–20)
BUN: 20 mg/dL (ref 6–20)
BUN: 20 mg/dL (ref 6–20)
BUN: 17 mg/dL (ref 6–20)
CALCIUM ION: 1.23 mmol/L (ref 1.13–1.30)
CALCIUM ION: 1.2 mmol/L (ref 1.13–1.30)
CALCIUM ION: 1.2 mmol/L (ref 1.13–1.30)
CREATININE: 0.8 mg/dL (ref 0.61–1.24)
CREATININE: 0.9 mg/dL (ref 0.61–1.24)
Calcium, Ion: 1.21 mmol/L (ref 1.13–1.30)
Chloride: 100 mmol/L — ABNORMAL LOW (ref 101–111)
Chloride: 100 mmol/L — ABNORMAL LOW (ref 101–111)
Chloride: 100 mmol/L — ABNORMAL LOW (ref 101–111)
Chloride: 101 mmol/L (ref 101–111)
Creatinine, Ser: 0.9 mg/dL (ref 0.61–1.24)
Creatinine, Ser: 0.8 mg/dL (ref 0.61–1.24)
GLUCOSE: 135 mg/dL — AB (ref 65–99)
Glucose, Bld: 152 mg/dL — ABNORMAL HIGH (ref 65–99)
Glucose, Bld: 151 mg/dL — ABNORMAL HIGH (ref 65–99)
Glucose, Bld: 105 mg/dL — ABNORMAL HIGH (ref 65–99)
HCT: 39 % (ref 39.0–52.0)
HEMATOCRIT: 35 % — AB (ref 39.0–52.0)
HEMATOCRIT: 35 % — AB (ref 39.0–52.0)
HEMATOCRIT: 32 % — AB (ref 39.0–52.0)
HEMOGLOBIN: 13.3 g/dL (ref 13.0–17.0)
HEMOGLOBIN: 11.9 g/dL — AB (ref 13.0–17.0)
HEMOGLOBIN: 10.9 g/dL — AB (ref 13.0–17.0)
Hemoglobin: 11.9 g/dL — ABNORMAL LOW (ref 13.0–17.0)
POTASSIUM: 4.2 mmol/L (ref 3.5–5.1)
Potassium: 4.1 mmol/L (ref 3.5–5.1)
Potassium: 4.2 mmol/L (ref 3.5–5.1)
Potassium: 4.6 mmol/L (ref 3.5–5.1)
SODIUM: 135 mmol/L (ref 135–145)
SODIUM: 134 mmol/L — AB (ref 135–145)
SODIUM: 135 mmol/L (ref 135–145)
SODIUM: 135 mmol/L (ref 135–145)
TCO2: 28 mmol/L (ref 0–100)
TCO2: 27 mmol/L (ref 0–100)
TCO2: 29 mmol/L (ref 0–100)
TCO2: 24 mmol/L (ref 0–100)

## 2015-09-18 LAB — POCT I-STAT 3, ART BLOOD GAS (G3+)
ACID-BASE DEFICIT: 1 mmol/L (ref 0.0–2.0)
ACID-BASE DEFICIT: 2 mmol/L (ref 0.0–2.0)
Acid-base deficit: 3 mmol/L — ABNORMAL HIGH (ref 0.0–2.0)
Bicarbonate: 26.3 mEq/L — ABNORMAL HIGH (ref 20.0–24.0)
Bicarbonate: 23.4 mEq/L (ref 20.0–24.0)
Bicarbonate: 25.6 mEq/L — ABNORMAL HIGH (ref 20.0–24.0)
O2 SAT: 100 %
O2 SAT: 97 %
O2 Saturation: 97 %
PCO2 ART: 46.1 mmHg — AB (ref 35.0–45.0)
PCO2 ART: 55.8 mmHg — AB (ref 35.0–45.0)
PH ART: 7.312 — AB (ref 7.350–7.450)
PH ART: 7.273 — AB (ref 7.350–7.450)
PO2 ART: 464 mmHg — AB (ref 80.0–100.0)
PO2 ART: 97 mmHg (ref 80.0–100.0)
PO2 ART: 104 mmHg — AB (ref 80.0–100.0)
Patient temperature: 37.6
TCO2: 28 mmol/L (ref 0–100)
TCO2: 25 mmol/L (ref 0–100)
TCO2: 27 mmol/L (ref 0–100)
pCO2 arterial: 53.5 mmHg — ABNORMAL HIGH (ref 35.0–45.0)
pH, Arterial: 7.301 — ABNORMAL LOW (ref 7.350–7.450)

## 2015-09-18 LAB — CREATININE, SERUM: CREATININE: 0.98 mg/dL (ref 0.61–1.24)

## 2015-09-18 LAB — CBC
HCT: 32.8 % — ABNORMAL LOW (ref 39.0–52.0)
HEMATOCRIT: 40 % (ref 39.0–52.0)
HEMATOCRIT: 30.4 % — AB (ref 39.0–52.0)
HEMOGLOBIN: 12.7 g/dL — AB (ref 13.0–17.0)
HEMOGLOBIN: 9.7 g/dL — AB (ref 13.0–17.0)
Hemoglobin: 10.4 g/dL — ABNORMAL LOW (ref 13.0–17.0)
MCH: 24.9 pg — ABNORMAL LOW (ref 26.0–34.0)
MCH: 24.7 pg — ABNORMAL LOW (ref 26.0–34.0)
MCH: 25.2 pg — ABNORMAL LOW (ref 26.0–34.0)
MCHC: 31.8 g/dL (ref 30.0–36.0)
MCHC: 31.7 g/dL (ref 30.0–36.0)
MCHC: 31.9 g/dL (ref 30.0–36.0)
MCV: 78.3 fL (ref 78.0–100.0)
MCV: 77.9 fL — ABNORMAL LOW (ref 78.0–100.0)
MCV: 79 fL (ref 78.0–100.0)
PLATELETS: 174 10*3/uL (ref 150–400)
Platelets: 211 10*3/uL (ref 150–400)
Platelets: 162 10*3/uL (ref 150–400)
RBC: 5.11 MIL/uL (ref 4.22–5.81)
RBC: 4.21 MIL/uL — ABNORMAL LOW (ref 4.22–5.81)
RBC: 3.85 MIL/uL — ABNORMAL LOW (ref 4.22–5.81)
RDW: 15.5 % (ref 11.5–15.5)
RDW: 15.4 % (ref 11.5–15.5)
RDW: 15.5 % (ref 11.5–15.5)
WBC: 11.6 10*3/uL — ABNORMAL HIGH (ref 4.0–10.5)
WBC: 12.7 10*3/uL — AB (ref 4.0–10.5)
WBC: 12.4 10*3/uL — AB (ref 4.0–10.5)

## 2015-09-18 LAB — APTT: APTT: 29 s (ref 24–37)

## 2015-09-18 LAB — POCT I-STAT 4, (NA,K, GLUC, HGB,HCT)
GLUCOSE: 122 mg/dL — AB (ref 65–99)
HCT: 31 % — ABNORMAL LOW (ref 39.0–52.0)
Hemoglobin: 10.5 g/dL — ABNORMAL LOW (ref 13.0–17.0)
Potassium: 3.8 mmol/L (ref 3.5–5.1)
Sodium: 133 mmol/L — ABNORMAL LOW (ref 135–145)

## 2015-09-18 LAB — GLUCOSE, CAPILLARY
GLUCOSE-CAPILLARY: 124 mg/dL — AB (ref 65–99)
Glucose-Capillary: 118 mg/dL — ABNORMAL HIGH (ref 65–99)

## 2015-09-18 LAB — ABO/RH: ABO/RH(D): A POS

## 2015-09-18 LAB — BASIC METABOLIC PANEL
Anion gap: 10 (ref 5–15)
BUN: 21 mg/dL — ABNORMAL HIGH (ref 6–20)
CALCIUM: 9 mg/dL (ref 8.9–10.3)
CO2: 26 mmol/L (ref 22–32)
CREATININE: 1.21 mg/dL (ref 0.61–1.24)
Chloride: 98 mmol/L — ABNORMAL LOW (ref 101–111)
Glucose, Bld: 146 mg/dL — ABNORMAL HIGH (ref 65–99)
POTASSIUM: 4.3 mmol/L (ref 3.5–5.1)
SODIUM: 134 mmol/L — AB (ref 135–145)

## 2015-09-18 LAB — PROTIME-INR
INR: 1.22 (ref 0.00–1.49)
PROTHROMBIN TIME: 15.6 s — AB (ref 11.6–15.2)

## 2015-09-18 LAB — SURGICAL PCR SCREEN
MRSA, PCR: NEGATIVE
Staphylococcus aureus: NEGATIVE

## 2015-09-18 LAB — HEPARIN LEVEL (UNFRACTIONATED): HEPARIN UNFRACTIONATED: 0.31 [IU]/mL (ref 0.30–0.70)

## 2015-09-18 LAB — MAGNESIUM: Magnesium: 2.8 mg/dL — ABNORMAL HIGH (ref 1.7–2.4)

## 2015-09-18 SURGERY — OFF PUMP CORONARY ARTERY BYPASS GRAFTING (CABG)
Anesthesia: General | Site: Chest

## 2015-09-18 MED ORDER — PHENYLEPHRINE HCL 10 MG/ML IJ SOLN
0.0000 ug/min | INTRAVENOUS | Status: DC
  Filled 2015-09-18: qty 2

## 2015-09-18 MED ORDER — SODIUM CHLORIDE 0.9 % IJ SOLN
INTRAMUSCULAR | Status: AC
  Filled 2015-09-18: qty 20

## 2015-09-18 MED ORDER — DEXTROSE 50 % IV SOLN
INTRAVENOUS | Status: AC
  Administered 2015-09-18: 26 mL via INTRAVENOUS
  Filled 2015-09-18: qty 50

## 2015-09-18 MED ORDER — DOPAMINE-DEXTROSE 3.2-5 MG/ML-% IV SOLN
0.0000 ug/kg/min | INTRAVENOUS | Status: DC
  Filled 2015-09-18: qty 250

## 2015-09-18 MED ORDER — SODIUM CHLORIDE 0.9 % IV SOLN
INTRAVENOUS | Status: DC
  Filled 2015-09-18: qty 30

## 2015-09-18 MED ORDER — ANTISEPTIC ORAL RINSE SOLUTION (CORINZ)
7.0000 mL | Freq: Four times a day (QID) | OROMUCOSAL | Status: DC
  Administered 2015-09-18: 7 mL via OROMUCOSAL

## 2015-09-18 MED ORDER — ALBUTEROL SULFATE (2.5 MG/3ML) 0.083% IN NEBU
2.5000 mg | INHALATION_SOLUTION | RESPIRATORY_TRACT | Status: DC | PRN
  Administered 2015-09-21 – 2015-09-27 (×15): 2.5 mg via RESPIRATORY_TRACT
  Filled 2015-09-18 (×15): qty 3

## 2015-09-18 MED ORDER — METOPROLOL TARTRATE 25 MG/10 ML ORAL SUSPENSION
12.5000 mg | Freq: Two times a day (BID) | ORAL | Status: DC
  Filled 2015-09-18 (×7): qty 5

## 2015-09-18 MED ORDER — FENTANYL CITRATE (PF) 100 MCG/2ML IJ SOLN
INTRAMUSCULAR | Status: AC
  Administered 2015-09-18 (×2): 100 ug via INTRAVENOUS
  Administered 2015-09-18 (×3): 250 ug via INTRAVENOUS
  Administered 2015-09-18 (×2): 100 ug via INTRAVENOUS
  Administered 2015-09-18 (×2): 50 ug via INTRAVENOUS
  Filled 2015-09-18: qty 2

## 2015-09-18 MED ORDER — PROTAMINE SULFATE 10 MG/ML IV SOLN
INTRAVENOUS | Status: DC | PRN
  Administered 2015-09-18: 150 mg via INTRAVENOUS

## 2015-09-18 MED ORDER — FENTANYL CITRATE (PF) 250 MCG/5ML IJ SOLN
INTRAMUSCULAR | Status: AC
  Filled 2015-09-18: qty 5

## 2015-09-18 MED ORDER — MIDAZOLAM HCL 2 MG/2ML IJ SOLN
2.0000 mg | INTRAMUSCULAR | Status: DC | PRN
  Administered 2015-09-18: 2 mg via INTRAVENOUS
  Filled 2015-09-18: qty 2

## 2015-09-18 MED ORDER — ROCURONIUM BROMIDE 100 MG/10ML IV SOLN
INTRAVENOUS | Status: DC | PRN
  Administered 2015-09-18: 20 mg via INTRAVENOUS
  Administered 2015-09-18: 50 mg via INTRAVENOUS
  Administered 2015-09-18: 30 mg via INTRAVENOUS
  Administered 2015-09-18: 50 mg via INTRAVENOUS

## 2015-09-18 MED ORDER — ACETAMINOPHEN 160 MG/5ML PO SOLN: 650.0000 mg | Freq: Once | ORAL | Status: AC

## 2015-09-18 MED ORDER — POTASSIUM CHLORIDE 10 MEQ/50ML IV SOLN
10.0000 meq | INTRAVENOUS | Status: AC
  Administered 2015-09-18 (×3): 10 meq via INTRAVENOUS

## 2015-09-18 MED ORDER — DEXMEDETOMIDINE HCL IN NACL 400 MCG/100ML IV SOLN
0.1000 ug/kg/h | INTRAVENOUS | Status: AC
  Administered 2015-09-18: .3 ug/kg/h via INTRAVENOUS
  Filled 2015-09-18: qty 100

## 2015-09-18 MED ORDER — MIDAZOLAM HCL 2 MG/2ML IJ SOLN
INTRAMUSCULAR | Status: AC
  Administered 2015-09-18 (×3): 1 mg via INTRAVENOUS
  Administered 2015-09-18: 5 mg via INTRAVENOUS
  Filled 2015-09-18: qty 2

## 2015-09-18 MED ORDER — ARTIFICIAL TEARS OP OINT
TOPICAL_OINTMENT | OPHTHALMIC | Status: DC | PRN
  Administered 2015-09-18: 1 via OPHTHALMIC

## 2015-09-18 MED ORDER — BISACODYL 10 MG RE SUPP: 10.0000 mg | Freq: Every day | RECTAL | Status: DC

## 2015-09-18 MED ORDER — PLASMA-LYTE 148 IV SOLN
INTRAVENOUS | Status: AC
  Administered 2015-09-18: 500 mL
  Filled 2015-09-18: qty 2.5

## 2015-09-18 MED ORDER — PANTOPRAZOLE SODIUM 40 MG PO TBEC
40.0000 mg | DELAYED_RELEASE_TABLET | Freq: Every day | ORAL | Status: DC
  Administered 2015-09-20 – 2015-09-21 (×2): 40 mg via ORAL
  Filled 2015-09-18 (×2): qty 1

## 2015-09-18 MED ORDER — SODIUM CHLORIDE 0.45 % IV SOLN
INTRAVENOUS | Status: DC | PRN
  Administered 2015-09-18: 16:00:00 via INTRAVENOUS

## 2015-09-18 MED ORDER — FAMOTIDINE IN NACL 20-0.9 MG/50ML-% IV SOLN
20.0000 mg | Freq: Two times a day (BID) | INTRAVENOUS | Status: AC
  Administered 2015-09-18: 20 mg via INTRAVENOUS

## 2015-09-18 MED ORDER — INSULIN REGULAR BOLUS VIA INFUSION
0.0000 [IU] | Freq: Three times a day (TID) | INTRAVENOUS | Status: DC
  Filled 2015-09-18: qty 10

## 2015-09-18 MED ORDER — GLYCOPYRROLATE 0.2 MG/ML IJ SOLN
INTRAMUSCULAR | Status: DC | PRN
  Administered 2015-09-18 (×2): .1 mg via INTRAVENOUS

## 2015-09-18 MED ORDER — CHLORHEXIDINE GLUCONATE 0.12% ORAL RINSE (MEDLINE KIT)
15.0000 mL | Freq: Two times a day (BID) | OROMUCOSAL | Status: DC
  Administered 2015-09-18: 15 mL via OROMUCOSAL

## 2015-09-18 MED ORDER — VANCOMYCIN HCL 10 G IV SOLR
1500.0000 mg | INTRAVENOUS | Status: AC
  Administered 2015-09-18: 1500 mg via INTRAVENOUS
  Filled 2015-09-18: qty 1500

## 2015-09-18 MED ORDER — DEXTROSE 5 % IV SOLN
1.5000 g | INTRAVENOUS | Status: AC
  Administered 2015-09-18: 1.5 g via INTRAVENOUS
  Filled 2015-09-18: qty 1.5

## 2015-09-18 MED ORDER — PROTAMINE SULFATE 10 MG/ML IV SOLN
INTRAVENOUS | Status: AC
  Filled 2015-09-18: qty 10

## 2015-09-18 MED ORDER — DEXTROSE 5 % IV SOLN
750.0000 mg | INTRAVENOUS | Status: DC
  Filled 2015-09-18: qty 750

## 2015-09-18 MED ORDER — PROPOFOL 10 MG/ML IV BOLUS
INTRAVENOUS | Status: DC | PRN
  Administered 2015-09-18: 50 mg via INTRAVENOUS

## 2015-09-18 MED ORDER — METOPROLOL TARTRATE 12.5 MG HALF TABLET
12.5000 mg | ORAL_TABLET | Freq: Two times a day (BID) | ORAL | Status: DC
  Administered 2015-09-19 – 2015-09-21 (×2): 12.5 mg via ORAL
  Filled 2015-09-18 (×7): qty 1

## 2015-09-18 MED ORDER — ASPIRIN EC 325 MG PO TBEC
325.0000 mg | DELAYED_RELEASE_TABLET | Freq: Every day | ORAL | Status: DC
  Administered 2015-09-19 – 2015-09-21 (×3): 325 mg via ORAL
  Filled 2015-09-18 (×3): qty 1

## 2015-09-18 MED ORDER — LACTATED RINGERS IV SOLN
INTRAVENOUS | Status: DC | PRN
  Administered 2015-09-18: 11:00:00 via INTRAVENOUS

## 2015-09-18 MED ORDER — LACTATED RINGERS IV SOLN
INTRAVENOUS | Status: DC | PRN
  Administered 2015-09-18 (×2): via INTRAVENOUS

## 2015-09-18 MED ORDER — PROTAMINE SULFATE 10 MG/ML IV SOLN
INTRAVENOUS | Status: AC
  Filled 2015-09-18: qty 25

## 2015-09-18 MED ORDER — ROCURONIUM BROMIDE 50 MG/5ML IV SOLN
INTRAVENOUS | Status: AC
  Filled 2015-09-18: qty 5

## 2015-09-18 MED ORDER — NITROGLYCERIN IN D5W 200-5 MCG/ML-% IV SOLN
2.0000 ug/min | INTRAVENOUS | Status: AC
  Administered 2015-09-18: 5 ug/min via INTRAVENOUS
  Filled 2015-09-18: qty 250

## 2015-09-18 MED ORDER — EPINEPHRINE HCL 1 MG/ML IJ SOLN
0.0000 ug/min | INTRAVENOUS | Status: DC
  Filled 2015-09-18: qty 4

## 2015-09-18 MED ORDER — ACETAMINOPHEN 500 MG PO TABS
1000.0000 mg | ORAL_TABLET | Freq: Four times a day (QID) | ORAL | Status: DC
  Administered 2015-09-19 – 2015-09-21 (×10): 1000 mg via ORAL
  Filled 2015-09-18 (×13): qty 2

## 2015-09-18 MED ORDER — GELATIN ABSORBABLE MT POWD
OROMUCOSAL | Status: DC | PRN
  Administered 2015-09-18 (×3): 6 mL via TOPICAL

## 2015-09-18 MED ORDER — SODIUM CHLORIDE 0.9 % IV SOLN
INTRAVENOUS | Status: DC
  Administered 2015-09-18: 1.2 [IU]/h via INTRAVENOUS
  Filled 2015-09-18: qty 2.5

## 2015-09-18 MED ORDER — NITROGLYCERIN IN D5W 200-5 MCG/ML-% IV SOLN: 0.0000 ug/min | INTRAVENOUS | Status: DC

## 2015-09-18 MED ORDER — ASPIRIN 81 MG PO CHEW: 324.0000 mg | CHEWABLE_TABLET | Freq: Every day | ORAL | Status: DC

## 2015-09-18 MED ORDER — POTASSIUM CHLORIDE 2 MEQ/ML IV SOLN
80.0000 meq | INTRAVENOUS | Status: DC
  Filled 2015-09-18: qty 40

## 2015-09-18 MED ORDER — ONDANSETRON HCL 4 MG/2ML IJ SOLN
4.0000 mg | Freq: Four times a day (QID) | INTRAMUSCULAR | Status: DC | PRN
  Administered 2015-09-19: 4 mg via INTRAVENOUS
  Filled 2015-09-18: qty 2

## 2015-09-18 MED ORDER — THROMBIN 20000 UNITS EX SOLR
CUTANEOUS | Status: AC
  Filled 2015-09-18: qty 20000

## 2015-09-18 MED ORDER — HEPARIN SODIUM (PORCINE) 1000 UNIT/ML IJ SOLN
INTRAMUSCULAR | Status: DC | PRN
  Administered 2015-09-18: 15000 [IU] via INTRAVENOUS

## 2015-09-18 MED ORDER — ALBUMIN HUMAN 5 % IV SOLN
250.0000 mL | INTRAVENOUS | Status: AC | PRN
  Administered 2015-09-18 (×3): 250 mL via INTRAVENOUS
  Filled 2015-09-18 (×2): qty 250

## 2015-09-18 MED ORDER — DEXTROSE 5 % IV SOLN
1.5000 g | Freq: Two times a day (BID) | INTRAVENOUS | Status: AC
  Administered 2015-09-18 – 2015-09-20 (×4): 1.5 g via INTRAVENOUS
  Filled 2015-09-18 (×5): qty 1.5

## 2015-09-18 MED ORDER — MAGNESIUM SULFATE 4 GM/100ML IV SOLN
4.0000 g | Freq: Once | INTRAVENOUS | Status: AC
  Administered 2015-09-18: 4 g via INTRAVENOUS
  Filled 2015-09-18: qty 100

## 2015-09-18 MED ORDER — DEXMEDETOMIDINE HCL IN NACL 200 MCG/50ML IV SOLN
0.0000 ug/kg/h | INTRAVENOUS | Status: DC
  Administered 2015-09-18: 0.4 ug/kg/h via INTRAVENOUS
  Filled 2015-09-18: qty 50

## 2015-09-18 MED ORDER — MAGNESIUM SULFATE 50 % IJ SOLN
40.0000 meq | INTRAMUSCULAR | Status: DC
  Filled 2015-09-18: qty 10

## 2015-09-18 MED ORDER — HEMOSTATIC AGENTS (NO CHARGE) OPTIME
TOPICAL | Status: DC | PRN
  Administered 2015-09-18: 1 via TOPICAL

## 2015-09-18 MED ORDER — VANCOMYCIN HCL IN DEXTROSE 1-5 GM/200ML-% IV SOLN
1000.0000 mg | Freq: Once | INTRAVENOUS | Status: AC
  Administered 2015-09-18: 1000 mg via INTRAVENOUS
  Filled 2015-09-18: qty 200

## 2015-09-18 MED ORDER — DOCUSATE SODIUM 100 MG PO CAPS
200.0000 mg | ORAL_CAPSULE | Freq: Every day | ORAL | Status: DC
  Administered 2015-09-19 – 2015-09-21 (×3): 200 mg via ORAL
  Filled 2015-09-18 (×3): qty 2

## 2015-09-18 MED ORDER — PROPOFOL 10 MG/ML IV BOLUS
INTRAVENOUS | Status: AC
  Filled 2015-09-18: qty 20

## 2015-09-18 MED ORDER — HEPARIN SODIUM (PORCINE) 1000 UNIT/ML IJ SOLN
INTRAMUSCULAR | Status: AC
  Filled 2015-09-18: qty 1

## 2015-09-18 MED ORDER — NEOSTIGMINE METHYLSULFATE 10 MG/10ML IV SOLN
INTRAVENOUS | Status: AC
  Filled 2015-09-18: qty 1

## 2015-09-18 MED ORDER — LACTATED RINGERS IV SOLN
INTRAVENOUS | Status: DC
  Administered 2015-09-19: 14:00:00 via INTRAVENOUS

## 2015-09-18 MED ORDER — MIDAZOLAM HCL 10 MG/2ML IJ SOLN
INTRAMUSCULAR | Status: AC
  Filled 2015-09-18: qty 4

## 2015-09-18 MED ORDER — ACETAMINOPHEN 650 MG RE SUPP
650.0000 mg | Freq: Once | RECTAL | Status: AC
  Administered 2015-09-18: 650 mg via RECTAL

## 2015-09-18 MED ORDER — PHENYLEPHRINE HCL 10 MG/ML IJ SOLN
10.0000 mg | INTRAVENOUS | Status: DC | PRN
  Administered 2015-09-18: 30 ug/min via INTRAVENOUS

## 2015-09-18 MED ORDER — PHENYLEPHRINE HCL 10 MG/ML IJ SOLN
30.0000 ug/min | INTRAVENOUS | Status: AC
  Administered 2015-09-18: 10 ug/min via INTRAVENOUS
  Filled 2015-09-18: qty 2

## 2015-09-18 MED ORDER — SUCCINYLCHOLINE CHLORIDE 20 MG/ML IJ SOLN
INTRAMUSCULAR | Status: AC
  Filled 2015-09-18: qty 1

## 2015-09-18 MED ORDER — 0.9 % SODIUM CHLORIDE (POUR BTL) OPTIME
TOPICAL | Status: DC | PRN
  Administered 2015-09-18: 5000 mL

## 2015-09-18 MED ORDER — ACETAMINOPHEN 160 MG/5ML PO SOLN: 1000.0000 mg | Freq: Four times a day (QID) | ORAL | Status: DC

## 2015-09-18 MED ORDER — SODIUM CHLORIDE 0.9 % IJ SOLN
3.0000 mL | Freq: Two times a day (BID) | INTRAMUSCULAR | Status: DC
  Administered 2015-09-19 – 2015-09-21 (×5): 3 mL via INTRAVENOUS

## 2015-09-18 MED ORDER — METOPROLOL TARTRATE 1 MG/ML IV SOLN: 2.5000 mg | INTRAVENOUS | Status: DC | PRN

## 2015-09-18 MED ORDER — LACTATED RINGERS IV SOLN: 500.0000 mL | Freq: Once | INTRAVENOUS | Status: DC | PRN

## 2015-09-18 MED ORDER — OXYCODONE HCL 5 MG PO TABS
5.0000 mg | ORAL_TABLET | ORAL | Status: DC | PRN
  Administered 2015-09-19 (×3): 10 mg via ORAL
  Administered 2015-09-20 (×2): 5 mg via ORAL
  Administered 2015-09-20: 10 mg via ORAL
  Administered 2015-09-20: 5 mg via ORAL
  Administered 2015-09-21 (×2): 10 mg via ORAL
  Filled 2015-09-18: qty 2
  Filled 2015-09-18: qty 1
  Filled 2015-09-18: qty 2
  Filled 2015-09-18: qty 1
  Filled 2015-09-18: qty 2
  Filled 2015-09-18: qty 1
  Filled 2015-09-18 (×3): qty 2

## 2015-09-18 MED ORDER — SODIUM CHLORIDE 0.9 % IV SOLN
INTRAVENOUS | Status: AC
  Administered 2015-09-18: 70 mL/h via INTRAVENOUS
  Filled 2015-09-18: qty 40

## 2015-09-18 MED ORDER — LIDOCAINE HCL (CARDIAC) 20 MG/ML IV SOLN
INTRAVENOUS | Status: AC
  Filled 2015-09-18: qty 5

## 2015-09-18 MED ORDER — LACTATED RINGERS IV SOLN: INTRAVENOUS | Status: DC

## 2015-09-18 MED ORDER — SODIUM CHLORIDE 0.9 % IJ SOLN: 3.0000 mL | INTRAMUSCULAR | Status: DC | PRN

## 2015-09-18 MED ORDER — PHENYLEPHRINE 40 MCG/ML (10ML) SYRINGE FOR IV PUSH (FOR BLOOD PRESSURE SUPPORT)
PREFILLED_SYRINGE | INTRAVENOUS | Status: AC
  Filled 2015-09-18: qty 10

## 2015-09-18 MED ORDER — METOCLOPRAMIDE HCL 5 MG/ML IJ SOLN
10.0000 mg | Freq: Four times a day (QID) | INTRAMUSCULAR | Status: AC
  Administered 2015-09-18 – 2015-09-19 (×4): 10 mg via INTRAVENOUS
  Filled 2015-09-18 (×4): qty 2

## 2015-09-18 MED ORDER — MORPHINE SULFATE (PF) 2 MG/ML IV SOLN
1.0000 mg | INTRAVENOUS | Status: AC | PRN
  Administered 2015-09-18: 2 mg via INTRAVENOUS
  Administered 2015-09-18: 1 mg via INTRAVENOUS
  Administered 2015-09-18: 4 mg via INTRAVENOUS
  Filled 2015-09-18: qty 1
  Filled 2015-09-18: qty 2

## 2015-09-18 MED ORDER — PHENYLEPHRINE HCL 10 MG/ML IJ SOLN
INTRAMUSCULAR | Status: DC | PRN
  Administered 2015-09-18: 40 ug via INTRAVENOUS
  Administered 2015-09-18: 80 ug via INTRAVENOUS
  Administered 2015-09-18: 40 ug via INTRAVENOUS
  Administered 2015-09-18: 80 ug via INTRAVENOUS
  Administered 2015-09-18: 40 ug via INTRAVENOUS

## 2015-09-18 MED ORDER — EPHEDRINE SULFATE 50 MG/ML IJ SOLN
INTRAMUSCULAR | Status: AC
  Filled 2015-09-18: qty 1

## 2015-09-18 MED ORDER — ONDANSETRON HCL 4 MG/2ML IJ SOLN
INTRAMUSCULAR | Status: AC
  Filled 2015-09-18: qty 2

## 2015-09-18 MED ORDER — EPHEDRINE SULFATE 50 MG/ML IJ SOLN
INTRAMUSCULAR | Status: DC | PRN
  Administered 2015-09-18 (×5): 5 mg via INTRAVENOUS

## 2015-09-18 MED ORDER — MORPHINE SULFATE (PF) 2 MG/ML IV SOLN
2.0000 mg | INTRAVENOUS | Status: DC | PRN
  Administered 2015-09-19: 2 mg via INTRAVENOUS
  Administered 2015-09-19: 4 mg via INTRAVENOUS
  Administered 2015-09-19 (×3): 2 mg via INTRAVENOUS
  Filled 2015-09-18 (×5): qty 1
  Filled 2015-09-18: qty 2

## 2015-09-18 MED ORDER — SODIUM CHLORIDE 0.9 % IV SOLN
INTRAVENOUS | Status: DC
  Administered 2015-09-18: 17:00:00 via INTRAVENOUS

## 2015-09-18 MED ORDER — SODIUM CHLORIDE 0.9 % IV SOLN
INTRAVENOUS | Status: AC
  Administered 2015-09-18: 1 [IU]/h via INTRAVENOUS
  Filled 2015-09-18: qty 2.5

## 2015-09-18 MED ORDER — ARTIFICIAL TEARS OP OINT
TOPICAL_OINTMENT | OPHTHALMIC | Status: AC
  Filled 2015-09-18: qty 3.5

## 2015-09-18 MED ORDER — SODIUM CHLORIDE 0.9 % IV SOLN: 250.0000 mL | INTRAVENOUS | Status: DC

## 2015-09-18 MED ORDER — BISACODYL 5 MG PO TBEC
10.0000 mg | DELAYED_RELEASE_TABLET | Freq: Every day | ORAL | Status: DC
  Administered 2015-09-19 – 2015-09-21 (×3): 10 mg via ORAL
  Filled 2015-09-18 (×3): qty 2

## 2015-09-18 MED ORDER — TRAMADOL HCL 50 MG PO TABS
50.0000 mg | ORAL_TABLET | ORAL | Status: DC | PRN
  Administered 2015-09-19 – 2015-09-21 (×4): 100 mg via ORAL
  Filled 2015-09-18 (×4): qty 2

## 2015-09-18 MED ORDER — THROMBIN 20000 UNITS EX KIT
PACK | CUTANEOUS | Status: DC | PRN
  Administered 2015-09-18: 20000 [IU] via TOPICAL

## 2015-09-18 MED ORDER — GLYCOPYRROLATE 0.2 MG/ML IJ SOLN
INTRAMUSCULAR | Status: AC
  Filled 2015-09-18: qty 1

## 2015-09-18 MED ORDER — DEXTROSE 50 % IV SOLN
26.0000 mL | Freq: Once | INTRAVENOUS | Status: AC
  Administered 2015-09-18: 26 mL via INTRAVENOUS
  Filled 2015-09-18: qty 50

## 2015-09-18 SURGICAL SUPPLY — 99 items
BAG DECANTER FOR FLEXI CONT (MISCELLANEOUS) ×4 IMPLANT
BANDAGE ELASTIC 4 VELCRO ST LF (GAUZE/BANDAGES/DRESSINGS) IMPLANT
BANDAGE ELASTIC 6 VELCRO ST LF (GAUZE/BANDAGES/DRESSINGS) IMPLANT
BASKET HEART  (ORDER IN 25'S) (MISCELLANEOUS) ×1
BASKET HEART (ORDER IN 25'S) (MISCELLANEOUS) ×1
BASKET HEART (ORDER IN 25S) (MISCELLANEOUS) ×2 IMPLANT
BLADE STERNUM SYSTEM 6 (BLADE) ×4 IMPLANT
BLOWER MISTER CAL-MED (MISCELLANEOUS) ×4 IMPLANT
BNDG GAUZE ELAST 4 BULKY (GAUZE/BANDAGES/DRESSINGS) IMPLANT
CANISTER SUCTION 2500CC (MISCELLANEOUS) ×4 IMPLANT
CATH ROBINSON RED A/P 18FR (CATHETERS) IMPLANT
CATH THORACIC 28FR (CATHETERS) ×4 IMPLANT
CATH THORACIC 36FR (CATHETERS) ×4 IMPLANT
CATH THORACIC 36FR RT ANG (CATHETERS) ×4 IMPLANT
COVER SURGICAL LIGHT HANDLE (MISCELLANEOUS) ×4 IMPLANT
CRADLE DONUT ADULT HEAD (MISCELLANEOUS) ×4 IMPLANT
DRAPE CARDIOVASCULAR INCISE (DRAPES) ×4
DRAPE SLUSH/WARMER DISC (DRAPES) ×4 IMPLANT
DRAPE SRG 135X102X78XABS (DRAPES) ×2 IMPLANT
DRSG COVADERM 4X14 (GAUZE/BANDAGES/DRESSINGS) ×4 IMPLANT
ELECT CAUTERY BLADE 6.4 (BLADE) ×4 IMPLANT
ELECT REM PT RETURN 9FT ADLT (ELECTROSURGICAL) ×8
ELECTRODE REM PT RTRN 9FT ADLT (ELECTROSURGICAL) ×4 IMPLANT
GAUZE SPONGE 4X4 12PLY STRL (GAUZE/BANDAGES/DRESSINGS) ×8 IMPLANT
GLOVE BIO SURGEON STRL SZ 6 (GLOVE) IMPLANT
GLOVE BIO SURGEON STRL SZ 6.5 (GLOVE) ×4 IMPLANT
GLOVE BIO SURGEON STRL SZ7 (GLOVE) IMPLANT
GLOVE BIO SURGEON STRL SZ7.5 (GLOVE) IMPLANT
GLOVE BIO SURGEONS STRL SZ 6.5 (GLOVE) ×4
GLOVE BIOGEL PI IND STRL 6 (GLOVE) IMPLANT
GLOVE BIOGEL PI IND STRL 6.5 (GLOVE) IMPLANT
GLOVE BIOGEL PI IND STRL 7.0 (GLOVE) IMPLANT
GLOVE BIOGEL PI INDICATOR 6 (GLOVE)
GLOVE BIOGEL PI INDICATOR 6.5 (GLOVE) ×4
GLOVE BIOGEL PI INDICATOR 7.0 (GLOVE)
GLOVE EUDERMIC 7 POWDERFREE (GLOVE) ×8 IMPLANT
GOWN STRL REUS W/ TWL LRG LVL3 (GOWN DISPOSABLE) ×8 IMPLANT
GOWN STRL REUS W/ TWL XL LVL3 (GOWN DISPOSABLE) ×2 IMPLANT
GOWN STRL REUS W/TWL LRG LVL3 (GOWN DISPOSABLE) ×16
GOWN STRL REUS W/TWL XL LVL3 (GOWN DISPOSABLE) ×4
HEMOSTAT POWDER SURGIFOAM 1G (HEMOSTASIS) ×12 IMPLANT
HEMOSTAT SURGICEL 2X14 (HEMOSTASIS) ×4 IMPLANT
KIT BASIN OR (CUSTOM PROCEDURE TRAY) ×4 IMPLANT
KIT CATH CPB BARTLE (MISCELLANEOUS) IMPLANT
KIT ROOM TURNOVER OR (KITS) ×4 IMPLANT
KIT SUCTION CATH 14FR (SUCTIONS) ×4 IMPLANT
KIT VASOVIEW W/TROCAR VH 2000 (KITS) ×4 IMPLANT
NS IRRIG 1000ML POUR BTL (IV SOLUTION) ×20 IMPLANT
PACK OPEN HEART (CUSTOM PROCEDURE TRAY) ×4 IMPLANT
PAD ARMBOARD 7.5X6 YLW CONV (MISCELLANEOUS) ×8 IMPLANT
PENCIL BUTTON HOLSTER BLD 10FT (ELECTRODE) ×2 IMPLANT
SHUNT FLO COIL 2.00MM (MISCELLANEOUS) ×2 IMPLANT
SPONGE GAUZE 4X4 12PLY STER LF (GAUZE/BANDAGES/DRESSINGS) ×2 IMPLANT
SPONGE LAP 18X18 X RAY DECT (DISPOSABLE) IMPLANT
SPONGE LAP 4X18 X RAY DECT (DISPOSABLE) IMPLANT
SUT BONE WAX W31G (SUTURE) ×4 IMPLANT
SUT ETHIBOND 2 0 SH (SUTURE) ×16
SUT ETHIBOND 2 0 SH 36X2 (SUTURE) IMPLANT
SUT MNCRL AB 4-0 PS2 18 (SUTURE) IMPLANT
SUT PROLENE 3 0 SH1 36 (SUTURE) ×4 IMPLANT
SUT PROLENE 4 0 RB 1 (SUTURE) ×4
SUT PROLENE 4-0 RB1 .5 CRCL 36 (SUTURE) IMPLANT
SUT PROLENE 5 0 C 1 36 (SUTURE) ×4 IMPLANT
SUT PROLENE 6 0 C 1 30 (SUTURE) ×12 IMPLANT
SUT PROLENE 7 0 BV1 MDA (SUTURE) ×2 IMPLANT
SUT PROLENE 8 0 BV175 6 (SUTURE) ×4 IMPLANT
SUT SILK  1 MH (SUTURE) ×6
SUT SILK 1 MH (SUTURE) IMPLANT
SUT SILK 1 TIES 10X30 (SUTURE) ×2 IMPLANT
SUT SILK 2 0 SH CR/8 (SUTURE) ×4 IMPLANT
SUT SILK 2 0 TIES 10X30 (SUTURE) ×2 IMPLANT
SUT SILK 2 0 TIES 17X18 (SUTURE) ×4
SUT SILK 2-0 18XBRD TIE BLK (SUTURE) IMPLANT
SUT SILK 3 0 SH CR/8 (SUTURE) ×2 IMPLANT
SUT SILK 4 0 TIE 10X30 (SUTURE) ×4 IMPLANT
SUT STEEL 6MS V (SUTURE) IMPLANT
SUT STEEL STERNAL CCS#1 18IN (SUTURE) IMPLANT
SUT STEEL SZ 6 DBL 3X14 BALL (SUTURE) ×6 IMPLANT
SUT TEM PAC WIRE 2 0 SH (SUTURE) ×8 IMPLANT
SUT VIC AB 1 CTX 36 (SUTURE) ×8
SUT VIC AB 1 CTX36XBRD ANBCTR (SUTURE) ×4 IMPLANT
SUT VIC AB 2-0 CT1 27 (SUTURE)
SUT VIC AB 2-0 CT1 TAPERPNT 27 (SUTURE) IMPLANT
SUT VIC AB 2-0 CTX 27 (SUTURE) ×4 IMPLANT
SUT VIC AB 3-0 SH 27 (SUTURE)
SUT VIC AB 3-0 SH 27X BRD (SUTURE) IMPLANT
SUT VIC AB 3-0 X1 27 (SUTURE) ×4 IMPLANT
SUTURE E-PAK OPEN HEART (SUTURE) ×4 IMPLANT
SYS GUIDANT ACHIEVE OFF PUMP (MISCELLANEOUS) ×4 IMPLANT
SYSTEM SAHARA CHEST DRAIN ATS (WOUND CARE) ×4 IMPLANT
TAPE CLOTH SURG 4X10 WHT LF (GAUZE/BANDAGES/DRESSINGS) ×2 IMPLANT
TAPE PAPER 2X10 WHT MICROPORE (GAUZE/BANDAGES/DRESSINGS) ×2 IMPLANT
TAPES RETRACTO (MISCELLANEOUS) ×4 IMPLANT
TOWEL OR 17X24 6PK STRL BLUE (TOWEL DISPOSABLE) ×8 IMPLANT
TOWEL OR 17X26 10 PK STRL BLUE (TOWEL DISPOSABLE) ×8 IMPLANT
TRAY FOLEY IC TEMP SENS 16FR (CATHETERS) ×4 IMPLANT
TUBING INSUFFLATION (TUBING) ×2 IMPLANT
UNDERPAD 30X30 INCONTINENT (UNDERPADS AND DIAPERS) ×4 IMPLANT
WATER STERILE IRR 1000ML POUR (IV SOLUTION) ×8 IMPLANT

## 2015-09-18 NOTE — Progress Notes (Signed)
      [address]       [name] [address]             [phone]      S/p OPCAB x 1  Intubated sedated  BP 106/57 mmHg  Pulse 56  Temp(Src) 97.6 F (36.4 C) (Axillary)  Resp 12  Ht 5\' 10"  (1.778 m)  Wt 197 lb (89.359 kg)  BMI 28.27 kg/m2  SpO2 96%  18/8 CI- 1.7   Intake/Output Summary (Last 24 hours) at 09/18/15 1642 Last data filed at 09/18/15 1616  Gross per 24 hour  Intake 2737.54 ml  Output   2550 ml  Net 187.54 ml   K= 3.8 HCT= 31  Doing well early postop Volume for low index and low filling pressures

## 2015-09-18 NOTE — Op Note (Signed)
CARDIOVASCULAR SURGERY OPERATIVE NOTE  09/18/2015  Surgeon:  Alleen Borne, MD  First Assistant: Lowella Dandy,  PA-C   Preoperative Diagnosis:  Severe proximal LAD coronary artery disease   Postoperative Diagnosis:  Same   Procedure:  1. Median Sternotomy  2. Off pump coronary artery bypass grafting x 1   Left internal mammary graft to the LAD    Anesthesia:  General Endotracheal   Clinical History/Surgical Indication:  The patient is a 64 year old gentleman with DM, hyperlipidemia, and hypertension who underwent stenting of the proximal LAD and PTCA of the D1 in 03/2008 at [facility]. He has been on Plavix since. He was in his usual state with chronic stable angina until yesterday when he developed sudden severe chest pain associated with nausea and diaphoresis. He was brought in by EMS and had positive troponin 0.10 increasing to 0.19 and down to 0.12 this am. Pain resolved with medical treatment and has not recurred. Cath this afternoon shows 90+% ostial LAD just proximal to the previous stent that is widely patent. LVEF 65%. He has a high grade ostial LAD stenosis just proximal to a patent stent. I think CABG with a LIMA to the LAD is the best long term treatment for this diabetic patient. I discussed the operative procedure with the patient  including alternatives, benefits and risks; including but not limited to bleeding, blood transfusion, infection, stroke, myocardial infarction, graft failure, heart block requiring a permanent pacemaker, organ dysfunction, and death.  Ian Morton understands and agrees to proceed.  He did not want me to call his wife or any other family members.     Preparation:  The patient was seen in the preoperative holding area and the correct patient, correct operation were confirmed with the patient after reviewing the medical record and  catheterization. The consent was signed by me. Preoperative antibiotics were given. A pulmonary arterial line and radial arterial line were placed by the anesthesia team. The patient was taken back to the operating room and positioned supine on the operating room table. After being placed under general endotracheal anesthesia by the anesthesia team a foley catheter was placed. The neck, chest, abdomen, and both legs were prepped with betadine soap and solution and draped in the usual sterile manner. A surgical time-out was taken and the correct patient and operative procedure were confirmed with the nursing and anesthesia staff.   Surgical Procedure:  A median sternotomy was performed. The pericardium was opened in the midline. Right ventricular function appeared normal. The ascending aorta was of normal size and had no palpable plaque.  The patient was fully  heparinized and the ACT was maintained > 300 sec. The Guidant Achieve off pump retractor system was used to stabilize the LAD. Silastic vessel tapes were placed proximally and distally to the planned anastomotic site. The vessel was opened and the tapes tightened but there was still a lot of blood flow through the arteriotomy site. Therefore a 2.0 mm coronary shunt was placed which resulted in complete hemostasis.    Left internal mammary harvest:  The left side of the sternum was retracted using the Rultract retractor. The left internal mammary artery was harvested as a pedicle graft. All side branches were clipped. It was a medium-sized vessel of good quality with excellent blood flow. It was ligated distally and divided. It was sprayed with topical papaverine solution to prevent vasospasm.   Coronary arteries:  The LAD coronary artery was examined.   LAD:  Intramyocardial  in its proximal portion and distal portion but visible in the mid portion on the surface of heart where there was no disease.    Grafts:  1. LIMA to the LAD: 2.0  mm. It was sewn end to side using 8-0 prolene continuous suture. The shunt was removed and the suture tied. The pedicle was sutured to the epicardium to prevent rotation. He tolerated the procedure well with no hemodynamic instability.   Completion:   The distal anastomosis was checked for hemostasis. The position of the graft was satisfactory. Two temporary epicardial pacing wires were placed on the right atrium and two on the right ventricle. TEE showed normal LV function. Protamine was given. Hemostasis was achieved. Mediastinal and left pleural drainage tubes were placed. The sternum was closed with double #6 stainless steel wires. The fascia was closed with continuous # 1 vicryl suture. The subcutaneous tissue was closed with 2-0 vicryl continuous suture. The skin was closed with 3-0 vicryl subcuticular suture. All sponge, needle, and instrument counts were reported correct at the end of the case. Dry sterile dressings were placed over the incisions and around the chest tubes which were connected to pleurevac suction. The patient was then transported to the surgical intensive care unit in critical but stable condition.

## 2015-09-18 NOTE — Progress Notes (Signed)
Hypoglycemic Event  CBG: 35  Treatment: Dextrose 50-89ml   Symptoms: none  Follow-up CBG: Time:166 CBG Result:1815  Possible Reasons for Event: Insulin drip post CABG  Comments/MD notified:Per Hypoglycemia protocol    Sebastian Ache

## 2015-09-18 NOTE — Anesthesia Procedure Notes (Signed)
Procedure Name: Intubation Date/Time: 09/18/2015 12:55 PM Performed by: Daiva Eves Pre-anesthesia Checklist: Patient identified, Emergency Drugs available, Suction available, Patient being monitored and Timeout performed Patient Re-evaluated:Patient Re-evaluated prior to inductionOxygen Delivery Method: Circle system utilized Preoxygenation: Pre-oxygenation with 100% oxygen Intubation Type: IV induction Ventilation: Mask ventilation without difficulty and Oral airway inserted - appropriate to patient size Laryngoscope Size: Mac and 4 Grade View: Grade I Tube type: Oral Tube size: 8.0 mm Number of attempts: 1 Placement Confirmation: ETT inserted through vocal cords under direct vision,  positive ETCO2,  CO2 detector and breath sounds checked- equal and bilateral Secured at: 22 cm Tube secured with: Tape Dental Injury: Teeth and Oropharynx as per pre-operative assessment  Comments: Extra laryngeal tissue- downward pressure needed

## 2015-09-18 NOTE — Progress Notes (Signed)
RT placed patient back on SIMV due to CO2 being out of range for extubation. Will attempt wean again when patient is more alert. RN is aware.

## 2015-09-18 NOTE — Anesthesia Preprocedure Evaluation (Signed)
Anesthesia Evaluation  Patient identified by MRN, date of birth, ID band Patient awake    Reviewed: Allergy & Precautions, NPO status , Patient's Chart, lab work & pertinent test results  Airway Mallampati: II  TM Distance: >3 FB Neck ROM: Full    Dental  (+) Teeth Intact, Dental Advisory Given   Pulmonary former smoker,    breath sounds clear to auscultation       Cardiovascular hypertension,  Rhythm:Regular Rate:Normal     Neuro/Psych    GI/Hepatic   Endo/Other  diabetes  Renal/GU      Musculoskeletal   Abdominal   Peds  Hematology   Anesthesia Other Findings   Reproductive/Obstetrics                             Anesthesia Physical Anesthesia Plan  ASA: IV  Anesthesia Plan: General   Post-op Pain Management:    Induction: Intravenous  Airway Management Planned: Oral ETT  Additional Equipment: CVP  Intra-op Plan:   Post-operative Plan: Extubation in OR  Informed Consent: I have reviewed the patients History and Physical, chart, labs and discussed the procedure including the risks, benefits and alternatives for the proposed anesthesia with the patient or authorized representative who has indicated his/her understanding and acceptance.   Dental advisory given  Plan Discussed with: CRNA and Anesthesiologist  Anesthesia Plan Comments:         Anesthesia Quick Evaluation

## 2015-09-18 NOTE — Anesthesia Postprocedure Evaluation (Signed)
  Anesthesia Post-op Note  Patient: Ian Morton  Procedure(s) Performed: Procedure(s): OFF PUMP CORONARY ARTERY BYPASS GRAFTING (CABG)X 1 using left internal mammory artery. (N/A) TRANSESOPHAGEAL ECHOCARDIOGRAM (TEE) (N/A)  Patient Location: SICU  Anesthesia Type:General  Level of Consciousness: sedated and Patient remains intubated per anesthesia plan  Airway and Oxygen Therapy: Patient remains intubated per anesthesia plan and Patient placed on Ventilator (see vital sign flow sheet for setting)  Post-op Pain: none  Post-op Assessment: Post-op Vital signs reviewed, Patient's Cardiovascular Status Stable, Respiratory Function Stable, Patent Airway and Pain level controlled              Post-op Vital Signs: stable  Last Vitals:  Filed Vitals:   09/18/15 0833  BP:   Pulse:   Temp: 36.4 C  Resp:     Complications: No apparent anesthesia complications

## 2015-09-18 NOTE — Transfer of Care (Signed)
Immediate Anesthesia Transfer of Care Note  Patient: Ian Morton  Procedure(s) Performed: Procedure(s): OFF PUMP CORONARY ARTERY BYPASS GRAFTING (CABG)X 1 using left internal mammory artery. (N/A) TRANSESOPHAGEAL ECHOCARDIOGRAM (TEE) (N/A)  Patient Location: SICU  Anesthesia Type:General  Level of Consciousness: sedated and Patient remains intubated per anesthesia plan  Airway & Oxygen Therapy: Patient remains intubated per anesthesia plan and Patient placed on Ventilator (see vital sign flow sheet for setting)  Post-op Assessment: Report given to RN and Post -op Vital signs reviewed and stable  Post vital signs: Reviewed and stable  Last Vitals:  Filed Vitals:   09/18/15 0833  BP:   Pulse:   Temp: 36.4 C  Resp:     Complications: No apparent anesthesia complications

## 2015-09-18 NOTE — OR Nursing (Signed)
Retractor-out call to SICU at 1423. Closing-skin call to SICU at 1507.

## 2015-09-18 NOTE — Brief Op Note (Signed)
09/14/2015 - 09/18/2015  2:25 PM  PATIENT:  Ian Morton  64 y.o. male  PRE-OPERATIVE DIAGNOSIS:  coronary artery disease  POST-OPERATIVE DIAGNOSIS:  Coronary artery disease  PROCEDURE:  Procedure(s):  OFF PUMP CORONARY ARTERY BYPASS GRAFTING  X 1 -LIMA to LAD  TRANSESOPHAGEAL ECHOCARDIOGRAM (TEE) (N/A)  SURGEON:  Surgeon(s) and Role:    * Alleen Borne, MD - Primary  PHYSICIAN ASSISTANT: Evalie Hargraves PA-C  ANESTHESIA:   general  EBL:  Total I/O In: 22.5 [I.V.:22.5] Out: 450 [Urine:450]  BLOOD ADMINISTERED: CELLSAVER  DRAINS: Left Pleural Chest Tubes, Mediastinal Chest Drains   LOCAL MEDICATIONS USED:  NONE  SPECIMEN:  No Specimen  DISPOSITION OF SPECIMEN:  N/A  COUNTS:  YES  TOURNIQUET:  * No tourniquets in log *  DICTATION: .Dragon Dictation  PLAN OF CARE: Admit to inpatient   PATIENT DISPOSITION:  ICU - intubated and hemodynamically stable.   Delay start of Pharmacological VTE agent (>24hrs) due to surgical blood loss or risk of bleeding: yes

## 2015-09-18 NOTE — CV Procedure (Signed)
Mr. Ian Morton is a 64 year old male with diabetes, hyperlipidemia, hypertension, and known coronary disease. He had previously undergone stenting of the proximal LAD and PTCA of the D1 branch in April 2009 at [facility].  He  was admitted to [facility]  on 09/14/2015 with the sudden onset of severe chest pain with marked diaphoresis and nausea with mildly  positive troponins. Cardiac cath revealed high-grade proximal LAD ostial stenosis just proximal to a previously placed stent. He was found to have a hyperdynamic left ventricle with an ejection fraction of 65%. He is now scheduled to undergo single-vessel coronary artery bypass by Dr. Laneta Simmers using an off-pump technique. Intraoperative transesophageal echocardiography was indicated to evaluate the right and left ventricular function, to assess for any valvular pathology, and to serve as a monitor for intraoperative volume status.  He was brought to the operating room at [facility] and general anesthesia was induced without difficulty. Following uneventful induction of anesthesia and an endotracheal intubation, the stomach was suctioned and the transesophageal echocardiography probe was inserted without difficulty.  Impression: Pre-pre-graft findings:   1. Aortic valve: There was mild leaflet thickening and normal leaflet separation with trace aortic insufficiency.  2. Mitral valve: The mitral leaflets appeared normal. There was normal coaptation of the leaflets and trace mitral regurgitation. There were no prolapsing or flail segments noted.  3. Left ventricle: There was normal contractility in all segments interrogated and the ejection fraction was estimated at 60%. Were no regional wall motion abnormalities. Left ventricular end-diastolic diameter measured 35.7 mm.  4. Right ventricle: The right ventricular size was normal. There was normal right ventricular systolic function and normal contractility of the  right ventricular free wall.  5. Tricuspid valve: The tricuspid valve appeared structurally normal and there was no tricuspid insufficiency.  6. Pulmonic valve: The pulmonic valve was visualized in the RV inflow outflow view and there was trace pulmonic regurg.  7. Interatrial septum: Interatrial septum was intact without evidence of patent foramen ovale or atrial septal defect by color Doppler and bubble study.   8. Left atrium: There was no thrombus noted within left atrium or left atrial appendage.   9. Descending aorta: There was a well-defined aortic root and sinotubular ridge without effacement or aortic or aneurysmal dilatation. There was intimal thickening noted in the ascending aorta but no protruding atheromata.  10. Descending aorta: The descending aorta was of normal diameter and measured 2.6 cm. There was grade 3 atheromatous plaques scattered within the wall the descending aorta.  Post-grafting exam.  Following grafting of the left internal mammary artery to the left anterior descending coronary artery, the left ventricle was examined and there were no regional wall motion abnormalities. The anterior wall appeared to contract normally.  Kipp Brood

## 2015-09-18 NOTE — Progress Notes (Signed)
  Echocardiogram Echocardiogram Transesophageal has been performed.  Arvil Chaco 09/18/2015, 1:12 PM

## 2015-09-18 NOTE — Addendum Note (Signed)
Addendum  created 09/18/15 1842 by Kipp Brood, MD   Modules edited: Notes Section   Notes Section:  File: [id number]; Pend: [id number]; Pend: [id number]; Beverely Low: [id number]; Pend: [id number]

## 2015-09-19 ENCOUNTER — Inpatient Hospital Stay (HOSPITAL_COMMUNITY): Payer: Medicare Other

## 2015-09-19 LAB — GLUCOSE, CAPILLARY
GLUCOSE-CAPILLARY: 103 mg/dL — AB (ref 65–99)
GLUCOSE-CAPILLARY: 105 mg/dL — AB (ref 65–99)
GLUCOSE-CAPILLARY: 106 mg/dL — AB (ref 65–99)
GLUCOSE-CAPILLARY: 107 mg/dL — AB (ref 65–99)
GLUCOSE-CAPILLARY: 108 mg/dL — AB (ref 65–99)
GLUCOSE-CAPILLARY: 109 mg/dL — AB (ref 65–99)
GLUCOSE-CAPILLARY: 109 mg/dL — AB (ref 65–99)
GLUCOSE-CAPILLARY: 131 mg/dL — AB (ref 65–99)
GLUCOSE-CAPILLARY: 133 mg/dL — AB (ref 65–99)
GLUCOSE-CAPILLARY: 134 mg/dL — AB (ref 65–99)
GLUCOSE-CAPILLARY: 161 mg/dL — AB (ref 65–99)
GLUCOSE-CAPILLARY: 77 mg/dL (ref 65–99)
GLUCOSE-CAPILLARY: 88 mg/dL (ref 65–99)
Glucose-Capillary: 35 mg/dL — CL (ref 65–99)
Glucose-Capillary: 118 mg/dL — ABNORMAL HIGH (ref 65–99)
Glucose-Capillary: 101 mg/dL — ABNORMAL HIGH (ref 65–99)
Glucose-Capillary: 102 mg/dL — ABNORMAL HIGH (ref 65–99)
Glucose-Capillary: 96 mg/dL (ref 65–99)
Glucose-Capillary: 95 mg/dL (ref 65–99)
Glucose-Capillary: 100 mg/dL — ABNORMAL HIGH (ref 65–99)
Glucose-Capillary: 114 mg/dL — ABNORMAL HIGH (ref 65–99)
Glucose-Capillary: 87 mg/dL (ref 65–99)
Glucose-Capillary: 90 mg/dL (ref 65–99)
Glucose-Capillary: 104 mg/dL — ABNORMAL HIGH (ref 65–99)

## 2015-09-19 LAB — CBC
HCT: 33.1 % — ABNORMAL LOW (ref 39.0–52.0)
HCT: 32.6 % — ABNORMAL LOW (ref 39.0–52.0)
HEMOGLOBIN: 10.5 g/dL — AB (ref 13.0–17.0)
HEMOGLOBIN: 10.3 g/dL — AB (ref 13.0–17.0)
MCH: 25.1 pg — ABNORMAL LOW (ref 26.0–34.0)
MCH: 25.1 pg — AB (ref 26.0–34.0)
MCHC: 31.7 g/dL (ref 30.0–36.0)
MCHC: 31.6 g/dL (ref 30.0–36.0)
MCV: 79.2 fL (ref 78.0–100.0)
MCV: 79.5 fL (ref 78.0–100.0)
PLATELETS: 152 10*3/uL (ref 150–400)
PLATELETS: 134 10*3/uL — AB (ref 150–400)
RBC: 4.18 MIL/uL — AB (ref 4.22–5.81)
RBC: 4.1 MIL/uL — AB (ref 4.22–5.81)
RDW: 15.6 % — ABNORMAL HIGH (ref 11.5–15.5)
RDW: 15.9 % — ABNORMAL HIGH (ref 11.5–15.5)
WBC: 15.3 10*3/uL — AB (ref 4.0–10.5)
WBC: 15 10*3/uL — ABNORMAL HIGH (ref 4.0–10.5)

## 2015-09-19 LAB — POCT I-STAT 3, ART BLOOD GAS (G3+)
ACID-BASE EXCESS: 1 mmol/L (ref 0.0–2.0)
Acid-base deficit: 2 mmol/L (ref 0.0–2.0)
Bicarbonate: 25 mEq/L — ABNORMAL HIGH (ref 20.0–24.0)
Bicarbonate: 27.1 mEq/L — ABNORMAL HIGH (ref 20.0–24.0)
O2 Saturation: 97 %
O2 Saturation: 98 %
PCO2 ART: 51.3 mmHg — AB (ref 35.0–45.0)
PH ART: 7.301 — AB (ref 7.350–7.450)
Patient temperature: 38
TCO2: 26 mmol/L (ref 0–100)
TCO2: 29 mmol/L (ref 0–100)
pCO2 arterial: 54.1 mmHg — ABNORMAL HIGH (ref 35.0–45.0)
pH, Arterial: 7.313 — ABNORMAL LOW (ref 7.350–7.450)
pO2, Arterial: 102 mmHg — ABNORMAL HIGH (ref 80.0–100.0)
pO2, Arterial: 117 mmHg — ABNORMAL HIGH (ref 80.0–100.0)

## 2015-09-19 LAB — BASIC METABOLIC PANEL
ANION GAP: 9 (ref 5–15)
BUN: 13 mg/dL (ref 6–20)
CHLORIDE: 104 mmol/L (ref 101–111)
CO2: 23 mmol/L (ref 22–32)
Calcium: 8 mg/dL — ABNORMAL LOW (ref 8.9–10.3)
Creatinine, Ser: 1.05 mg/dL (ref 0.61–1.24)
GFR calc Af Amer: >60 mL/min (ref >60)
Glucose, Bld: 100 mg/dL — ABNORMAL HIGH (ref 65–99)
POTASSIUM: 4.2 mmol/L (ref 3.5–5.1)
SODIUM: 136 mmol/L (ref 135–145)

## 2015-09-19 LAB — POCT I-STAT, CHEM 8
BUN: 17 mg/dL (ref 6–20)
CHLORIDE: 100 mmol/L — AB (ref 101–111)
CREATININE: 1 mg/dL (ref 0.61–1.24)
Calcium, Ion: 1.23 mmol/L (ref 1.13–1.30)
Glucose, Bld: 98 mg/dL (ref 65–99)
HEMATOCRIT: 33 % — AB (ref 39.0–52.0)
Hemoglobin: 11.2 g/dL — ABNORMAL LOW (ref 13.0–17.0)
POTASSIUM: 4.3 mmol/L (ref 3.5–5.1)
Sodium: 133 mmol/L — ABNORMAL LOW (ref 135–145)
TCO2: 23 mmol/L (ref 0–100)

## 2015-09-19 LAB — MAGNESIUM
MAGNESIUM: 2.3 mg/dL (ref 1.7–2.4)
Magnesium: 2.3 mg/dL (ref 1.7–2.4)

## 2015-09-19 LAB — CREATININE, SERUM
Creatinine, Ser: 1.02 mg/dL (ref 0.61–1.24)
GFR calc Af Amer: >60 mL/min (ref >60)
GFR calc non Af Amer: >60 mL/min (ref >60)

## 2015-09-19 MED ORDER — ENOXAPARIN SODIUM 40 MG/0.4ML ~~LOC~~ SOLN
40.0000 mg | Freq: Every day | SUBCUTANEOUS | Status: DC
  Administered 2015-09-19 – 2015-09-27 (×7): 40 mg via SUBCUTANEOUS
  Filled 2015-09-19 (×9): qty 0.4

## 2015-09-19 MED ORDER — INSULIN DETEMIR 100 UNIT/ML ~~LOC~~ SOLN
20.0000 [IU] | Freq: Once | SUBCUTANEOUS | Status: AC
  Administered 2015-09-19: 20 [IU] via SUBCUTANEOUS
  Filled 2015-09-19: qty 0.2

## 2015-09-19 MED ORDER — INSULIN ASPART 100 UNIT/ML ~~LOC~~ SOLN
0.0000 [IU] | SUBCUTANEOUS | Status: DC
  Administered 2015-09-19 – 2015-09-20 (×2): 2 [IU] via SUBCUTANEOUS

## 2015-09-19 MED ORDER — KETOROLAC TROMETHAMINE 15 MG/ML IJ SOLN
15.0000 mg | Freq: Four times a day (QID) | INTRAMUSCULAR | Status: AC
  Administered 2015-09-19 – 2015-09-21 (×8): 15 mg via INTRAVENOUS
  Filled 2015-09-19 (×8): qty 1

## 2015-09-19 MED ORDER — INSULIN DETEMIR 100 UNIT/ML ~~LOC~~ SOLN
20.0000 [IU] | Freq: Every day | SUBCUTANEOUS | Status: DC
  Filled 2015-09-19: qty 0.2

## 2015-09-19 NOTE — Progress Notes (Signed)
1 Day Post-Op Procedure(s) (LRB): OFF PUMP CORONARY ARTERY BYPASS GRAFTING (CABG)X 1 using left internal mammory artery. (N/A) TRANSESOPHAGEAL ECHOCARDIOGRAM (TEE) (N/A) Subjective: C/o incisional pain  Objective: Vital signs in last 24 hours: Temp:  [94.6 F (34.8 C)-100.9 F (38.3 C)] 98.6 F (37 C) (10/15 0900) Pulse Rate:  [86-94] 89 (10/15 0900) Cardiac Rhythm:  [-] Atrial paced (10/15 0800) Resp:  [10-25] 14 (10/15 0900) BP: (69-127)/(45-111) 83/45 mmHg (10/15 0900) SpO2:  [91 %-100 %] 100 % (10/15 0900) Arterial Line BP: (83-153)/(46-71) 97/51 mmHg (10/15 0900) FiO2 (%):  [40 %-50 %] 40 % (10/15 0031) Weight:  [197 lb 15.6 oz (89.8 kg)] 197 lb 15.6 oz (89.8 kg) (10/15 0500)  Hemodynamic parameters for last 24 hours: PAP: (16-48)/(7-28) 44/21 mmHg CO:  [4.4 L/min-7.4 L/min] 6.9 L/min CI:  [2.1 L/min/m2-3.6 L/min/m2] 3.4 L/min/m2  Intake/Output from previous day: 10/14 0701 - 10/15 0700 In: 4600.5 [I.V.:3300.5; IV Piggyback:1300] Out: 2480 [Urine:1840; Blood:150; Chest Tube:490] Intake/Output this shift: Total I/O In: 88.5 [I.V.:88.5] Out: 185 [Urine:135; Chest Tube:50]  General appearance: alert and mild distress Neurologic: intact Heart: regular rate and rhythm Lungs: diminished breath sounds bibasilar Abdomen: normal findings: soft, non-tender  Lab Results:  Recent Labs  09/18/15 2030 09/18/15 2139 09/19/15 0424  WBC 12.4*  --  15.3*  HGB 9.7* 10.9* 10.5*  HCT 30.4* 32.0* 33.1*  PLT 162  --  152   BMET:  Recent Labs  09/18/15 0237  09/18/15 2139 09/19/15 0424  NA 134*  < > 135 136  K 4.3  < > 4.6 4.2  CL 98*  < > 101 104  CO2 26  --   --  23  GLUCOSE 146*  < > 105* 100*  BUN 21*  < > 17 13  CREATININE 1.21  < > 0.90 1.05  CALCIUM 9.0  --   --  8.0*  < > = values in this interval not displayed.  PT/INR:  Recent Labs  09/18/15 1540  LABPROT 15.6*  INR 1.22   ABG    Component Value Date/Time   PHART 7.301* 09/19/2015 0152   HCO3  25.0* 09/19/2015 0152   TCO2 26 09/19/2015 0152   ACIDBASEDEF 2.0 09/19/2015 0152   O2SAT 97.0 09/19/2015 0152   CBG (last 3)   Recent Labs  09/18/15 2201 09/18/15 2302 09/18/15 2356  GLUCAP 102* 134* 109*    Assessment/Plan: S/P Procedure(s) (LRB): OFF PUMP CORONARY ARTERY BYPASS GRAFTING (CABG)X 1 using left internal mammory artery. (N/A) TRANSESOPHAGEAL ECHOCARDIOGRAM (TEE) (N/A) POD # 1   CV- atrially paced, good index  Dc swan  RESP - CO2 retention- IS, nebs PRN  RENAL- creatinine and lytes OK  ENDO- CBG well controlled  Transition to levemir + SSI  Anemia secondary to ABL- mild follow  DVT prophylaxis- SCD + enoxaparin  DC CT  OOB   LOS: 5 days    Loreli Slot 09/19/2015

## 2015-09-19 NOTE — Progress Notes (Signed)
Patient performed -25 NIF and FVC.

## 2015-09-19 NOTE — Progress Notes (Signed)
      [address]       [name] [address]             [phone]      Question aspiration earlier- hold off on POs and reassess in AM  BP 105/83 mmHg  Pulse 87  Temp(Src) 97.9 F (36.6 C) (Axillary)  Resp 11  Ht 5\' 10"  (1.778 m)  Wt 197 lb 15.6 oz (89.8 kg)  BMI 28.41 kg/m2  SpO2 97%   Intake/Output Summary (Last 24 hours) at 09/19/15 1949 Last data filed at 09/19/15 1900  Gross per 24 hour  Intake 1555.52 ml  Output   1875 ml  Net -319.48 ml    Stable POD # 1  Adriahna Shearman C. Dorris Fetch, MD [facility] [phone]

## 2015-09-19 NOTE — Procedures (Signed)
Extubation Procedure Note  Patient Details:   Name: Mong Fuson DOB: [birth year] MRN: [id number]   Airway Documentation:  Airway 8 mm (Active)  Secured at (cm) 21 cm 09/18/2015 11:38 PM  Measured From Lips 09/18/2015 11:38 PM  Secured Location Right 09/18/2015 11:38 PM  Secured By Caron Presume Tape 09/18/2015 11:38 PM  Cuff Pressure (cm H2O) 26 cm H2O 09/18/2015 11:38 PM  Site Condition Dry 09/18/2015 11:38 PM    Evaluation  O2 sats: stable throughout Complications: No apparent complications Patient did tolerate procedure well. Bilateral Breath Sounds: Clear, Diminished Suctioning: Airway Yes  Patient extubated to 4L [place] and was able to say his name. Patient's O2 sat is 100%. Patient performed on IS.  Romona Curls Suella Cogar 09/19/2015, 1:20 AM

## 2015-09-20 ENCOUNTER — Inpatient Hospital Stay (HOSPITAL_COMMUNITY): Payer: Medicare Other

## 2015-09-20 LAB — GLUCOSE, CAPILLARY
GLUCOSE-CAPILLARY: 133 mg/dL — AB (ref 65–99)
GLUCOSE-CAPILLARY: 142 mg/dL — AB (ref 65–99)
GLUCOSE-CAPILLARY: 164 mg/dL — AB (ref 65–99)
Glucose-Capillary: 67 mg/dL (ref 65–99)
Glucose-Capillary: 91 mg/dL (ref 65–99)
Glucose-Capillary: 139 mg/dL — ABNORMAL HIGH (ref 65–99)

## 2015-09-20 LAB — BASIC METABOLIC PANEL WITH GFR
Anion gap: 6 (ref 5–15)
BUN: 19 mg/dL (ref 6–20)
CO2: 27 mmol/L (ref 22–32)
Calcium: 8 mg/dL — ABNORMAL LOW (ref 8.9–10.3)
Chloride: 99 mmol/L — ABNORMAL LOW (ref 101–111)
Creatinine, Ser: 1.24 mg/dL (ref 0.61–1.24)
GFR calc Af Amer: >60 mL/min
GFR calc non Af Amer: 59 mL/min — ABNORMAL LOW
Glucose, Bld: 97 mg/dL (ref 65–99)
Potassium: 4.2 mmol/L (ref 3.5–5.1)
Sodium: 132 mmol/L — ABNORMAL LOW (ref 135–145)

## 2015-09-20 LAB — CBC
HCT: 31.3 % — ABNORMAL LOW (ref 39.0–52.0)
Hemoglobin: 9.6 g/dL — ABNORMAL LOW (ref 13.0–17.0)
MCH: 24.4 pg — ABNORMAL LOW (ref 26.0–34.0)
MCHC: 30.7 g/dL (ref 30.0–36.0)
MCV: 79.6 fL (ref 78.0–100.0)
Platelets: 125 K/uL — ABNORMAL LOW (ref 150–400)
RBC: 3.93 MIL/uL — ABNORMAL LOW (ref 4.22–5.81)
RDW: 15.9 % — ABNORMAL HIGH (ref 11.5–15.5)
WBC: 13.8 K/uL — ABNORMAL HIGH (ref 4.0–10.5)

## 2015-09-20 MED ORDER — INSULIN ASPART 100 UNIT/ML ~~LOC~~ SOLN
0.0000 [IU] | Freq: Three times a day (TID) | SUBCUTANEOUS | Status: DC
  Administered 2015-09-20: 3 [IU] via SUBCUTANEOUS
  Administered 2015-09-20 – 2015-09-21 (×2): 2 [IU] via SUBCUTANEOUS
  Administered 2015-09-21: 3 [IU] via SUBCUTANEOUS

## 2015-09-20 MED ORDER — INSULIN DETEMIR 100 UNIT/ML ~~LOC~~ SOLN
10.0000 [IU] | Freq: Every day | SUBCUTANEOUS | Status: DC
  Administered 2015-09-20 – 2015-09-22 (×3): 10 [IU] via SUBCUTANEOUS
  Filled 2015-09-20 (×4): qty 0.1

## 2015-09-20 NOTE — Progress Notes (Signed)
2 Days Post-Op Procedure(s) (LRB): OFF PUMP CORONARY ARTERY BYPASS GRAFTING (CABG)X 1 using left internal mammory artery. (N/A) TRANSESOPHAGEAL ECHOCARDIOGRAM (TEE) (N/A) Subjective: Hypotensive after lopressor last night- was paced and put back on neo  Objective: Vital signs in last 24 hours: Temp:  [97.7 F (36.5 C)-98.8 F (37.1 C)] 98.1 F (36.7 C) (10/16 0800) Pulse Rate:  [36-90] 80 (10/16 0900) Cardiac Rhythm:  [-] Normal sinus rhythm (10/16 0900) Resp:  [10-24] 19 (10/16 0830) BP: (79-130)/(42-90) 109/64 mmHg (10/16 0900) SpO2:  [79 %-100 %] 100 % (10/16 0900) Arterial Line BP: (89-191)/(46-185) 92/66 mmHg (10/15 1900) Weight:  [203 lb 7.8 oz (92.3 kg)] 203 lb 7.8 oz (92.3 kg) (10/16 0500)  Hemodynamic parameters for last 24 hours: PAP: (30)/(18) 30/18 mmHg  Intake/Output from previous day: 10/15 0701 - 10/16 0700 In: 1194.2 [P.O.:480; I.V.:564.2; IV Piggyback:150] Out: 990 [Urine:920; Chest Tube:70] Intake/Output this shift: Total I/O In: 455 [P.O.:400; I.V.:55] Out: 80 [Urine:80]  General appearance: alert, cooperative and no distress Neurologic: intact Heart: regular rate and rhythm Lungs: diminished breath sounds bibasilar Abdomen: abnormal findings:  distended, nontender, + BS  Lab Results:  Recent Labs  09/19/15 1745 09/20/15 0357  WBC 15.0* 13.8*  HGB 10.3* 9.6*  HCT 32.6* 31.3*  PLT 134* 125*   BMET:  Recent Labs  09/19/15 0424 09/19/15 1707 09/19/15 1745 09/20/15 0357  NA 136 133*  --  132*  K 4.2 4.3  --  4.2  CL 104 100*  --  99*  CO2 23  --   --  27  GLUCOSE 100* 98  --  97  BUN 13 17  --  19  CREATININE 1.05 1.00 1.02 1.24  CALCIUM 8.0*  --   --  8.0*    PT/INR:  Recent Labs  09/18/15 1540  LABPROT 15.6*  INR 1.22   ABG    Component Value Date/Time   PHART 7.301* 09/19/2015 0152   HCO3 25.0* 09/19/2015 0152   TCO2 23 09/19/2015 1707   ACIDBASEDEF 2.0 09/19/2015 0152   O2SAT 97.0 09/19/2015 0152   CBG (last 3)    Recent Labs  09/20/15 0337 09/20/15 0338 09/20/15 0752  GLUCAP 67 91 133*    Assessment/Plan: S/P Procedure(s) (LRB): OFF PUMP CORONARY ARTERY BYPASS GRAFTING (CABG)X 1 using left internal mammory artery. (N/A) TRANSESOPHAGEAL ECHOCARDIOGRAM (TEE) (N/A) -  CV- s/p CABG x 1  In SR, pacing dc'ed  On low dose neo- wean off as BP tolerates  RESP- IS for bibasilar atelectasis  RENAL- creatinine and lytes ok  Weight up a little but BP marginal so will hold off on lasix  ENDO- CBG relatively low, decrease levemir, change to AC/HS  SCD + enoxaparin for DVT prophylaxis   LOS: 6 days    Loreli Slot 09/20/2015

## 2015-09-20 NOTE — Progress Notes (Signed)
      [address]       [name] [address]             [phone]      C/o incisional pain  BP 101/58 mmHg  Pulse 88  Temp(Src) 98.3 F (36.8 C) (Oral)  Resp 18  Ht 5\' 10"  (1.778 m)  Wt 203 lb 7.8 oz (92.3 kg)  BMI 29.20 kg/m2  SpO2 93%   Intake/Output Summary (Last 24 hours) at 09/20/15 1728 Last data filed at 09/20/15 1400  Gross per 24 hour  Intake 1416.3 ml  Output    620 ml  Net  796.3 ml    BP still on low side, but has stayed off neo.  Salvatore Decent Dorris Fetch, MD [facility] [phone]

## 2015-09-21 ENCOUNTER — Encounter (HOSPITAL_COMMUNITY): Payer: Self-pay | Admitting: Surgery

## 2015-09-21 ENCOUNTER — Inpatient Hospital Stay (HOSPITAL_COMMUNITY): Payer: Medicare Other

## 2015-09-21 ENCOUNTER — Ambulatory Visit: Payer: Medicare Other | Admitting: Allergy and Immunology

## 2015-09-21 LAB — BASIC METABOLIC PANEL
Anion gap: 6 (ref 5–15)
BUN: 21 mg/dL — ABNORMAL HIGH (ref 6–20)
CALCIUM: 8 mg/dL — AB (ref 8.9–10.3)
CO2: 28 mmol/L (ref 22–32)
CREATININE: 1.17 mg/dL (ref 0.61–1.24)
Chloride: 98 mmol/L — ABNORMAL LOW (ref 101–111)
GLUCOSE: 166 mg/dL — AB (ref 65–99)
Potassium: 3.7 mmol/L (ref 3.5–5.1)
Sodium: 132 mmol/L — ABNORMAL LOW (ref 135–145)

## 2015-09-21 LAB — CBC
HEMATOCRIT: 27.7 % — AB (ref 39.0–52.0)
Hemoglobin: 8.7 g/dL — ABNORMAL LOW (ref 13.0–17.0)
MCH: 24.6 pg — ABNORMAL LOW (ref 26.0–34.0)
MCHC: 31.4 g/dL (ref 30.0–36.0)
MCV: 78.2 fL (ref 78.0–100.0)
PLATELETS: 123 10*3/uL — AB (ref 150–400)
RBC: 3.54 MIL/uL — ABNORMAL LOW (ref 4.22–5.81)
RDW: 16.1 % — AB (ref 11.5–15.5)
WBC: 12 10*3/uL — ABNORMAL HIGH (ref 4.0–10.5)

## 2015-09-21 LAB — GLUCOSE, CAPILLARY
Glucose-Capillary: 151 mg/dL — ABNORMAL HIGH (ref 65–99)
Glucose-Capillary: 123 mg/dL — ABNORMAL HIGH (ref 65–99)
Glucose-Capillary: 159 mg/dL — ABNORMAL HIGH (ref 65–99)
Glucose-Capillary: 114 mg/dL — ABNORMAL HIGH (ref 65–99)

## 2015-09-21 MED ORDER — FUROSEMIDE 10 MG/ML IJ SOLN
40.0000 mg | Freq: Once | INTRAMUSCULAR | Status: AC
  Administered 2015-09-21: 40 mg via INTRAVENOUS
  Filled 2015-09-21: qty 4

## 2015-09-21 MED ORDER — ONDANSETRON HCL 4 MG/2ML IJ SOLN: 4.0000 mg | Freq: Four times a day (QID) | INTRAMUSCULAR | Status: DC | PRN

## 2015-09-21 MED ORDER — ONDANSETRON HCL 4 MG PO TABS: 4.0000 mg | ORAL_TABLET | Freq: Four times a day (QID) | ORAL | Status: DC | PRN

## 2015-09-21 MED ORDER — SODIUM CHLORIDE 0.9 % IV SOLN: 250.0000 mL | INTRAVENOUS | Status: DC | PRN

## 2015-09-21 MED ORDER — METOPROLOL TARTRATE 12.5 MG HALF TABLET
12.5000 mg | ORAL_TABLET | Freq: Two times a day (BID) | ORAL | Status: DC
  Administered 2015-09-23: 12.5 mg via ORAL
  Filled 2015-09-21 (×5): qty 1

## 2015-09-21 MED ORDER — HALOPERIDOL LACTATE 5 MG/ML IJ SOLN
5.0000 mg | Freq: Once | INTRAMUSCULAR | Status: AC
  Administered 2015-09-21: 5 mg via INTRAVENOUS

## 2015-09-21 MED ORDER — POTASSIUM CHLORIDE CRYS ER 20 MEQ PO TBCR
40.0000 meq | EXTENDED_RELEASE_TABLET | Freq: Once | ORAL | Status: AC
  Administered 2015-09-21: 40 meq via ORAL
  Filled 2015-09-21: qty 2

## 2015-09-21 MED ORDER — MOVING RIGHT ALONG BOOK
Freq: Once | Status: DC
  Filled 2015-09-21: qty 1

## 2015-09-21 MED ORDER — FAMOTIDINE 20 MG PO TABS
20.0000 mg | ORAL_TABLET | Freq: Two times a day (BID) | ORAL | Status: DC
  Administered 2015-09-23: 20 mg via ORAL
  Filled 2015-09-21 (×4): qty 1

## 2015-09-21 MED ORDER — ACETAMINOPHEN 325 MG PO TABS
650.0000 mg | ORAL_TABLET | Freq: Four times a day (QID) | ORAL | Status: DC | PRN
  Administered 2015-09-23: 650 mg via ORAL
  Filled 2015-09-21: qty 2

## 2015-09-21 MED ORDER — HALOPERIDOL LACTATE 5 MG/ML IJ SOLN
INTRAMUSCULAR | Status: AC
  Administered 2015-09-21: 5 mg via INTRAVENOUS
  Filled 2015-09-21: qty 1

## 2015-09-21 MED ORDER — SODIUM CHLORIDE 0.9 % IJ SOLN
3.0000 mL | Freq: Two times a day (BID) | INTRAMUSCULAR | Status: DC
  Administered 2015-09-22: 3 mL via INTRAVENOUS

## 2015-09-21 MED ORDER — ASPIRIN EC 325 MG PO TBEC
325.0000 mg | DELAYED_RELEASE_TABLET | Freq: Every day | ORAL | Status: DC
  Administered 2015-09-23: 325 mg via ORAL
  Filled 2015-09-21 (×2): qty 1

## 2015-09-21 MED ORDER — DOCUSATE SODIUM 100 MG PO CAPS
200.0000 mg | ORAL_CAPSULE | Freq: Every day | ORAL | Status: DC
  Administered 2015-09-23: 200 mg via ORAL
  Filled 2015-09-21: qty 2

## 2015-09-21 MED ORDER — BISACODYL 10 MG RE SUPP: 10.0000 mg | Freq: Every day | RECTAL | Status: DC | PRN

## 2015-09-21 MED ORDER — TRAMADOL HCL 50 MG PO TABS
50.0000 mg | ORAL_TABLET | ORAL | Status: DC | PRN
Start: 2015-09-21 — End: 2015-09-22
  Administered 2015-09-21: 100 mg via ORAL
  Filled 2015-09-21: qty 2

## 2015-09-21 MED ORDER — SODIUM CHLORIDE 0.9 % IJ SOLN: 3.0000 mL | INTRAMUSCULAR | Status: DC | PRN

## 2015-09-21 MED ORDER — POTASSIUM CHLORIDE 10 MEQ/50ML IV SOLN
10.0000 meq | INTRAVENOUS | Status: DC | PRN
  Administered 2015-09-21: 10 meq via INTRAVENOUS

## 2015-09-21 MED ORDER — INSULIN ASPART 100 UNIT/ML ~~LOC~~ SOLN
0.0000 [IU] | Freq: Three times a day (TID) | SUBCUTANEOUS | Status: DC
  Administered 2015-09-22 – 2015-09-23 (×4): 2 [IU] via SUBCUTANEOUS

## 2015-09-21 MED ORDER — HALOPERIDOL LACTATE 5 MG/ML IJ SOLN
5.0000 mg | Freq: Once | INTRAMUSCULAR | Status: AC
  Administered 2015-09-21: 5 mg via INTRAVENOUS
  Filled 2015-09-21: qty 1

## 2015-09-21 MED ORDER — BISACODYL 5 MG PO TBEC: 10.0000 mg | DELAYED_RELEASE_TABLET | Freq: Every day | ORAL | Status: DC | PRN

## 2015-09-21 MED ORDER — POTASSIUM CHLORIDE CRYS ER 20 MEQ PO TBCR
20.0000 meq | EXTENDED_RELEASE_TABLET | Freq: Two times a day (BID) | ORAL | Status: DC
  Filled 2015-09-21 (×4): qty 1

## 2015-09-21 MED ORDER — FUROSEMIDE 40 MG PO TABS
40.0000 mg | ORAL_TABLET | Freq: Every day | ORAL | Status: DC
  Filled 2015-09-21 (×2): qty 1

## 2015-09-21 MED ORDER — OXYCODONE HCL 5 MG PO TABS
5.0000 mg | ORAL_TABLET | ORAL | Status: DC | PRN
Start: 2015-09-21 — End: 2015-09-22

## 2015-09-21 MED FILL — Sodium Chloride IV Soln 0.9%: INTRAVENOUS | Qty: 1000 | Status: AC

## 2015-09-21 MED FILL — Heparin Sodium (Porcine) Inj 1000 Unit/ML: INTRAMUSCULAR | Qty: 30 | Status: AC

## 2015-09-21 MED FILL — Magnesium Sulfate Inj 50%: INTRAMUSCULAR | Qty: 10 | Status: AC

## 2015-09-21 MED FILL — Potassium Chloride Inj 2 mEq/ML: INTRAVENOUS | Qty: 40 | Status: AC

## 2015-09-21 MED FILL — Cefuroxime Sodium For Inj 750 MG: INTRAMUSCULAR | Qty: 750 | Status: AC
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
## 2015-09-21 NOTE — Progress Notes (Signed)
Pt wife called to see if she could come in to help console pt. Wife said she could not come until Wednesday and that she did not know if her children would be able to come visit him either. Will continue to monitor.

## 2015-09-21 NOTE — Progress Notes (Signed)
3 Days Post-Op Procedure(s) (LRB): OFF PUMP CORONARY ARTERY BYPASS GRAFTING (CABG)X 1 using left internal mammory artery. (N/A) TRANSESOPHAGEAL ECHOCARDIOGRAM (TEE) (N/A) Subjective:  No specific complaints  Objective: Vital signs in last 24 hours: Temp:  [97.7 F (36.5 C)-98.9 F (37.2 C)] 97.7 F (36.5 C) (10/17 0721) Pulse Rate:  [63-113] 79 (10/17 0700) Cardiac Rhythm:  [-] Normal sinus rhythm (10/17 0700) Resp:  [12-23] 12 (10/17 0700) BP: (84-135)/(56-78) 115/75 mmHg (10/17 0700) SpO2:  [76 %-100 %] 96 % (10/17 0700) Weight:  [94.5 kg (208 lb 5.4 oz)] 94.5 kg (208 lb 5.4 oz) (10/17 0500)  Hemodynamic parameters for last 24 hours:    Intake/Output from previous day: 10/16 0701 - 10/17 0700 In: 1156.3 [P.O.:880; I.V.:226.3; IV Piggyback:50] Out: 1360 [Urine:1360] Intake/Output this shift:    General appearance: alert and cooperative Neurologic: intact Heart: regular rate and rhythm, S1, S2 normal, no murmur, click, rub or gallop Lungs: bibasilar crackles Extremities: edema mild Wound: dressings dry  Lab Results:  Recent Labs  09/20/15 0357 09/21/15 0405  WBC 13.8* 12.0*  HGB 9.6* 8.7*  HCT 31.3* 27.7*  PLT 125* 123*   BMET:  Recent Labs  09/20/15 0357 09/21/15 0405  NA 132* 132*  K 4.2 3.7  CL 99* 98*  CO2 27 28  GLUCOSE 97 166*  BUN 19 21*  CREATININE 1.24 1.17  CALCIUM 8.0* 8.0*    PT/INR:  Recent Labs  09/18/15 1540  LABPROT 15.6*  INR 1.22   ABG    Component Value Date/Time   PHART 7.301* 09/19/2015 0152   HCO3 25.0* 09/19/2015 0152   TCO2 23 09/19/2015 1707   ACIDBASEDEF 2.0 09/19/2015 0152   O2SAT 97.0 09/19/2015 0152   CBG (last 3)   Recent Labs  09/20/15 1211 09/20/15 1721 09/20/15 2134  GLUCAP 142* 164* 139*    Assessment/Plan: S/P Procedure(s) (LRB): OFF PUMP CORONARY ARTERY BYPASS GRAFTING (CABG)X 1 using left internal mammory artery. (N/A) TRANSESOPHAGEAL ECHOCARDIOGRAM (TEE) (N/A)  He has been hemodynamically  stable. Mobilize Diuresis Diabetes control Plan for transfer to step-down: see transfer orders  PT consult. He will probably need SNF at discharge   LOS: 7 days    Alleen Borne 09/21/2015
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
## 2015-09-21 NOTE — Progress Notes (Signed)
Pt increasingly combative, coming out of bed, refusing care and stating he is leaving the hospital. MD called and came by to assess pt, Haldol ordered and given. Pt educated on sternal precautions but pt is refusing care at this time. After MD spoke with pt, he began to calm down. Once calm about 45 min later pt is getting out of bed again. Sitter requested additional help and pt stated he was going to the restroom. Pt once again educated on sternal precautions but is not accepting education at this time. Pt will not ask for help and gets increasingly agitated and combative when attempting to console and help pt. Pt was escorted to the bathroom with the best of our ability and once finished escorted safely to bed. Pt currently resting, complaining of chest pain, shortness of breath and stating he is "going to die". Second MD assessed pt and ordered Haldol for later in the evening if pt needs it. Will review current orders, continue to assess pt and attempt to keep him safe to the best of our ability.
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
## 2015-09-22 LAB — GLUCOSE, CAPILLARY
GLUCOSE-CAPILLARY: 109 mg/dL — AB (ref 65–99)
GLUCOSE-CAPILLARY: 141 mg/dL — AB (ref 65–99)
Glucose-Capillary: 134 mg/dL — ABNORMAL HIGH (ref 65–99)
Glucose-Capillary: 137 mg/dL — ABNORMAL HIGH (ref 65–99)

## 2015-09-22 MED ORDER — METOPROLOL TARTRATE 1 MG/ML IV SOLN: 2.5000 mg | INTRAVENOUS | Status: DC | PRN

## 2015-09-22 MED ORDER — DEXMEDETOMIDINE HCL IN NACL 400 MCG/100ML IV SOLN
0.4000 ug/kg/h | INTRAVENOUS | Status: DC
  Administered 2015-09-22 (×7): 1.2 ug/kg/h via INTRAVENOUS
  Administered 2015-09-22: 0.4 ug/kg/h via INTRAVENOUS
  Administered 2015-09-22 (×3): 1.2 ug/kg/h via INTRAVENOUS
  Administered 2015-09-23: 0.6 ug/kg/h via INTRAVENOUS
  Administered 2015-09-23: 1.2 ug/kg/h via INTRAVENOUS
  Filled 2015-09-22 (×2): qty 50
  Filled 2015-09-22: qty 100
  Filled 2015-09-22: qty 50
  Filled 2015-09-22: qty 100
  Filled 2015-09-22 (×2): qty 50
  Filled 2015-09-22: qty 100
  Filled 2015-09-22: qty 50
  Filled 2015-09-22: qty 100
  Filled 2015-09-22: qty 50
  Filled 2015-09-22: qty 100
  Filled 2015-09-22: qty 50

## 2015-09-22 MED ORDER — HALOPERIDOL LACTATE 5 MG/ML IJ SOLN
INTRAMUSCULAR | Status: AC
  Filled 2015-09-22: qty 1

## 2015-09-22 MED ORDER — LORAZEPAM 2 MG/ML IJ SOLN
0.5000 mg | INTRAMUSCULAR | Status: DC | PRN
  Administered 2015-09-22 (×3): 0.5 mg via INTRAVENOUS
  Filled 2015-09-22 (×2): qty 1

## 2015-09-22 MED ORDER — KETOROLAC TROMETHAMINE 15 MG/ML IJ SOLN
15.0000 mg | Freq: Four times a day (QID) | INTRAMUSCULAR | Status: AC | PRN
Start: 2015-09-22 — End: 2015-09-27
  Administered 2015-09-22 – 2015-09-27 (×12): 15 mg via INTRAVENOUS
  Filled 2015-09-22 (×12): qty 1

## 2015-09-22 MED ORDER — KCL IN DEXTROSE-NACL 20-5-0.9 MEQ/L-%-% IV SOLN
INTRAVENOUS | Status: DC
  Administered 2015-09-22 (×2): via INTRAVENOUS
  Filled 2015-09-22 (×3): qty 1000

## 2015-09-22 MED ORDER — HALOPERIDOL LACTATE 5 MG/ML IJ SOLN
5.0000 mg | Freq: Once | INTRAMUSCULAR | Status: AC
  Administered 2015-09-22: 5 mg via INTRAVENOUS

## 2015-09-22 MED ORDER — ACETAMINOPHEN 650 MG RE SUPP: 650.0000 mg | RECTAL | Status: DC | PRN

## 2015-09-22 MED ORDER — LORAZEPAM 2 MG/ML IJ SOLN
INTRAMUSCULAR | Status: AC
  Administered 2015-09-22: 0.5 mg via INTRAVENOUS
  Filled 2015-09-22: qty 1

## 2015-09-22 NOTE — Clinical Social Work Note (Signed)
CSW received consult for possible SNF placement.  At this time, patient is agitated requiring 4-point restraints.  PT evaluation pending.  CSW will follow and assist with disposition.  Vickii Penna, LCSW [phone]  [facility] Licensed Clinical Social Worker

## 2015-09-22 NOTE — Progress Notes (Signed)
[facility] BRIEF SICU PROGRESS NOTE  4 Days Post-Op  S/P Procedure(s) (LRB): OFF PUMP CORONARY ARTERY BYPASS GRAFTING (CABG)X 1 using left internal mammory artery. (N/A) TRANSESOPHAGEAL ECHOCARDIOGRAM (TEE) (N/A)   Stable day but still not following commands Calm on Ativan and Precedex NSR w/ stable BP O2 sats 100% Low grade temp 100.5 UOP adequate  Plan: Continue current plan  Purcell Nails, MD 09/22/2015 5:27 PM

## 2015-09-22 NOTE — Progress Notes (Signed)
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 (N/A) TRANSESOPHAGEAL ECHOCARDIOGRAM (TEE) (N/A) Subjective:  Does not communicate  Objective: Vital signs in last 24 hours: Temp:  [97.6 F (36.4 C)-99.6 F (37.6 C)] 99.6 F (37.6 C) (10/18 0350) Pulse Rate:  [69-72] 72 (10/17 1200) Cardiac Rhythm:  [-] Normal sinus rhythm (10/18 0734) Resp:  [15-36] 20 (10/18 0700) BP: (64-171)/(37-132) 121/59 mmHg (10/18 0700) SpO2:  [70 %-100 %] 100 % (10/18 0700) Weight:  [94.2 kg (207 lb 10.8 oz)] 94.2 kg (207 lb 10.8 oz) (10/18 0500)  Hemodynamic parameters for last 24 hours:    Intake/Output from previous day: 10/17 0701 - 10/18 0700 In: 132.1 [P.O.:50; I.V.:82.1] Out: 545 [Urine:545] Intake/Output this shift:    General appearance: delirious Neurologic: moving all extremies Heart: regular rate and rhythm, S1, S2 normal, no murmur, click, rub or gallop Lungs: clear to auscultation bilaterally Extremities: extremities normal, atraumatic, no cyanosis or edema Wound: incision ok  Lab Results:  Recent Labs  09/20/15 0357 09/21/15 0405  WBC 13.8* 12.0*  HGB 9.6* 8.7*  HCT 31.3* 27.7*  PLT 125* 123*   BMET:  Recent Labs  09/20/15 0357 09/21/15 0405  NA 132* 132*  K 4.2 3.7  CL 99* 98*  CO2 27 28  GLUCOSE 97 166*  BUN 19 21*  CREATININE 1.24 1.17  CALCIUM 8.0* 8.0*    PT/INR: No results for input(s): LABPROT, INR in the last 72 hours. 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 (N/A) TRANSESOPHAGEAL ECHOCARDIOGRAM (TEE)  (N/A)  He is hemodynamically stable but has developed postop delirium. He is on Precedex 1.3 and Ativan and is still thrashing around in bed, requiring restraints. He is verbalizing but not following commands or opening eyes. We don't have much history on him to know if he has any history of drug or alcohol use. Family has not been involved with him at all and refused to come in. He seemed ok preop and for the first few days preop. Delirium developed yesterday am and has progressed. Will continue restraints for safely and current drug combination. Discontinue narcotics and use Toradol for pain. Keep NPO and start IVF.   LOS: 8 days    Ian Morton 09/22/2015

## 2015-09-22 NOTE — Progress Notes (Signed)
MD notified of hypertension and persistent agitation despite max dose of precedex.  Lopressor and Ativan ordered.

## 2015-09-22 NOTE — Progress Notes (Signed)
MD notified that the patient continues to be combative despite repeat dose of haldol, precedex ordered.

## 2015-09-22 NOTE — Progress Notes (Signed)
PT Cancellation Note  Patient Details Name: Ian Morton MRN: [id number] DOB: Mar 04, 1951   Cancelled Treatment:    Reason Eval/Treat Not Completed: Patient not medically ready.  Pt very agitated, not interacting, in 4-point restraints.  Will check back later as able, ore evaluate 10/19.    09/22/2015  [place] Bing, PT [phone]  (pager)   Chanteria Haggard, Eliseo Gum 09/22/2015, 11:12 AM

## 2015-09-23 LAB — CBC
HCT: 27 % — ABNORMAL LOW (ref 39.0–52.0)
HEMOGLOBIN: 8.5 g/dL — AB (ref 13.0–17.0)
MCH: 24.5 pg — AB (ref 26.0–34.0)
MCHC: 31.5 g/dL (ref 30.0–36.0)
MCV: 77.8 fL — AB (ref 78.0–100.0)
PLATELETS: 130 10*3/uL — AB (ref 150–400)
RBC: 3.47 MIL/uL — AB (ref 4.22–5.81)
RDW: 17.3 % — ABNORMAL HIGH (ref 11.5–15.5)
WBC: 9.3 10*3/uL (ref 4.0–10.5)

## 2015-09-23 LAB — GLUCOSE, CAPILLARY
GLUCOSE-CAPILLARY: 123 mg/dL — AB (ref 65–99)
GLUCOSE-CAPILLARY: 69 mg/dL (ref 65–99)
GLUCOSE-CAPILLARY: 78 mg/dL (ref 65–99)
GLUCOSE-CAPILLARY: 93 mg/dL (ref 65–99)
GLUCOSE-CAPILLARY: 99 mg/dL (ref 65–99)

## 2015-09-23 LAB — BASIC METABOLIC PANEL
Anion gap: 9 (ref 5–15)
BUN: 17 mg/dL (ref 6–20)
CALCIUM: 8.5 mg/dL — AB (ref 8.9–10.3)
CHLORIDE: 105 mmol/L (ref 101–111)
CO2: 26 mmol/L (ref 22–32)
CREATININE: 0.78 mg/dL (ref 0.61–1.24)
GFR calc non Af Amer: >60 mL/min (ref >60)
GLUCOSE: 156 mg/dL — AB (ref 65–99)
Potassium: 4.1 mmol/L (ref 3.5–5.1)
Sodium: 140 mmol/L (ref 135–145)

## 2015-09-23 MED ORDER — ASPIRIN EC 325 MG PO TBEC
325.0000 mg | DELAYED_RELEASE_TABLET | Freq: Every day | ORAL | Status: DC
  Administered 2015-09-24 – 2015-09-28 (×5): 325 mg via ORAL
  Filled 2015-09-23 (×6): qty 1

## 2015-09-23 MED ORDER — MOVING RIGHT ALONG BOOK
Freq: Once | Status: AC
  Administered 2015-09-24: 14:00:00
  Filled 2015-09-23: qty 1

## 2015-09-23 MED ORDER — SODIUM CHLORIDE 0.9 % IJ SOLN: 3.0000 mL | INTRAMUSCULAR | Status: DC | PRN

## 2015-09-23 MED ORDER — SODIUM CHLORIDE 0.9 % IJ SOLN
3.0000 mL | Freq: Two times a day (BID) | INTRAMUSCULAR | Status: DC
  Administered 2015-09-24 – 2015-09-28 (×8): 3 mL via INTRAVENOUS

## 2015-09-23 MED ORDER — SODIUM CHLORIDE 0.9 % IV SOLN: 250.0000 mL | INTRAVENOUS | Status: DC | PRN

## 2015-09-23 MED ORDER — INSULIN ASPART 100 UNIT/ML ~~LOC~~ SOLN
0.0000 [IU] | Freq: Three times a day (TID) | SUBCUTANEOUS | Status: DC
  Administered 2015-09-25 – 2015-09-28 (×4): 2 [IU] via SUBCUTANEOUS

## 2015-09-23 MED ORDER — METOPROLOL TARTRATE 12.5 MG HALF TABLET
12.5000 mg | ORAL_TABLET | Freq: Two times a day (BID) | ORAL | Status: DC
  Administered 2015-09-24 – 2015-09-28 (×10): 12.5 mg via ORAL
  Filled 2015-09-23 (×12): qty 1

## 2015-09-23 NOTE — Evaluation (Signed)
Physical Therapy Evaluation Patient Details Name: Ian Morton MRN: [id number] DOB: [birth year] Today's Date: 09/23/2015   History of Present Illness  pt is a 64 y/o male admitted with CP and had cardiac markers showing NSTEMI.  Pt s/p off pump CABG x1.  PMHx of MI, vertigo.  Clinical Impression   Pt admitted with/for CABG due to CP and NSTEMI.  Pt currently limited functionally due to the problems listed below.  (see problems list.)  Pt will benefit from PT to maximize function and safety to be able to get home safely with available assist of family.      Follow Up Recommendations Home health PT;Supervision/Assistance - 24 hour    Equipment Recommendations  Other (comment) (TBA)    Recommendations for Other Services       Precautions / Restrictions Precautions Precautions: Fall;Sternal      Mobility  Bed Mobility Overal bed mobility: Needs Assistance Bed Mobility: Supine to Sit;Sit to Supine     Supine to sit: Min assist Sit to supine: Min assist   General bed mobility comments: cues for technique given sternal precautions.  Transfers Overall transfer level: Needs assistance Equipment used: 1 person hand held assist Transfers: Sit to/from [employer] Sit to Stand: Min guard Stand pivot transfers: Min guard       General transfer comment: cues to refrain from using UE's to push up  Ambulation/Gait Ambulation/Gait assistance: Min assist Ambulation Distance (Feet): 60 Feet Assistive device: 1 person hand held assist Gait Pattern/deviations: Step-through pattern     General Gait Details: mildly unsteady throughout with scissoring and wandering  Stairs            Wheelchair Mobility    Modified Rankin (Stroke Patients Only)       Balance Overall balance assessment: Needs assistance Sitting-balance support: No upper extremity supported Sitting balance-Leahy Scale: Fair     Standing balance support: No upper extremity  supported;Single extremity supported Standing balance-Leahy Scale: Fair                               Pertinent Vitals/Pain Pain Assessment: Faces Pain Score: 6  Faces Pain Scale: Hurts even more Pain Location: chest Pain Descriptors / Indicators: Aching Pain Intervention(s): Monitored during session    Home Living Family/patient expects to be discharged to:: Private residence Living Arrangements: Spouse/significant other Available Help at Discharge: Family;Available 24 hours/day (spouse and children) Type of Home: House Home Access: Stairs to enter Entrance Stairs-Rails: Right;Left Entrance Stairs-Number of Steps: several Home Layout: Two level;Able to live on main level with bedroom/bathroom;Bed/bath upstairs Home Equipment: Walker - 2 wheels;Cane - single point      Prior Function Level of Independence: Independent with assistive device(s)               Hand Dominance        Extremity/Trunk Assessment   Upper Extremity Assessment: Defer to OT evaluation           Lower Extremity Assessment: Generalized weakness;Difficult to assess due to impaired cognition         Communication   Communication: No difficulties  Cognition Arousal/Alertness: Awake/alert Behavior During Therapy: Flat affect;Restless Overall Cognitive Status: Impaired/Different from baseline                      General Comments General comments (skin integrity, edema, etc.): SpO2 98-100, EHR 95 bpm    Exercises  Assessment/Plan    PT Assessment Patient needs continued PT services  PT Diagnosis Difficulty walking;Generalized weakness   PT Problem List Decreased strength;Decreased activity tolerance;Decreased balance;Decreased mobility;Decreased knowledge of use of DME;Decreased knowledge of precautions;Cardiopulmonary status limiting activity  PT Treatment Interventions Gait training;Functional mobility training;Therapeutic activities;Balance  training;Patient/family education   PT Goals (Current goals can be found in the Care Plan section) Acute Rehab PT Goals Patient Stated Goal: pt did not participate PT Goal Formulation: Patient unable to participate in goal setting Time For Goal Achievement: 10/07/15 Potential to Achieve Goals: Good    Frequency Min 3X/week   Barriers to discharge        Co-evaluation               End of Session   Activity Tolerance: Patient tolerated treatment well Patient left: in bed;with call bell/phone within reach;with nursing/sitter in room Nurse Communication: Mobility status         Time: 2703-5009 PT Time Calculation (min) (ACUTE ONLY): 26 min   Charges:   PT Evaluation $Initial PT Evaluation Tier I: 1 Procedure PT Treatments $Gait Training: 8-22 mins   PT G Codes:        Ian Morton, Ian Morton 09/23/2015, 3:24 PM 09/23/2015  Ian Morton, PT [phone]  (pager)

## 2015-09-23 NOTE — Clinical Social Work Note (Signed)
Clinical Social Work Assessment  Patient Details  Name: Ian Morton MRN: [id number] Date of Birth: 19-Mar-1951  Date of referral:  09/23/15               Reason for consult:  Facility Placement                Permission sought to share information with:  Oceanographer granted to share information::  No (patient is alert though not oriented)  Name::        Agency::   [facility] SNFs)  Relationship::     Contact Information:     Housing/Transportation Living arrangements for the past 2 months:  Single Family Home Source of Information:  Spouse, Other (Comment Required) (child) Patient Interpreter Needed:  None Criminal Activity/Legal Involvement Pertinent to Current Situation/Hospitalization:  No - Comment as needed Significant Relationships:    Lives with:  Spouse Do you feel safe going back to the place where you live?  No (wife unable to care for husband at home) Need for family participation in patient care:  Yes (Comment) (patient is alert though not oriented)  Care giving concerns:  Wife states she can no longer care for patient at home.   Social Worker assessment / plan:  CSW received notification that patient's spouse was requesting SNF placement at time of discharge.  Patient's wife states she is no longer able to care for patient at home.  Wife states that patient has been placed in SNF in the past in [place] though she could not remember the name of the SNF.  Patient has Medicaid as a payer source which may be a barrier to STR at SNF.  It appears that patient may need long-term care.  Will need PT assistance to determine STR/LTC needs.  Employment status:  Retired Health and safety inspector:  Medicaid In [place] PT Recommendations:  Not assessed at this time Information / Referral to community resources:   (will be referred to SNF)  Patient/Family's Response to care:  Wife is agreeable to placement.  Patient/Family's Understanding of and  Emotional Response to Diagnosis, Current Treatment, and Prognosis:  Wife is realistic regarding patient's quality of life and projected facility placement needs.  Emotional Assessment Appearance:  Appears stated age Attitude/Demeanor/Rapport:   (patient has been combative and requiring 4 point restraints during this admission) Affect (typically observed):  Anxious, Restless Orientation:   (disoriented) Alcohol / Substance use:  Not Applicable Psych involvement (Current and /or in the community):  No (Comment)  Discharge Needs  Concerns to be addressed:  No discharge needs identified Readmission within the last 30 days:    Current discharge risk:  Cognitively Impaired Barriers to Discharge:  Continued Medical Work up, Other (restraints needed; behavior issues; complaince issues)   Rondel Baton, LCSW 09/23/2015, 2:03 PM

## 2015-09-23 NOTE — Plan of Care (Signed)
Problem: Phase II - Intermediate Post-Op Goal: Advance Diet Outcome: Not Progressing Pt NPO due to increased agitation and delirium. Goal: Activity Progressed Outcome: Not Progressing See above Goal: Patient advanced to Phase III: Barriers addressed Outcome: Not Progressing See above

## 2015-09-23 NOTE — Progress Notes (Signed)
      [address]       [place] [address]             [phone]      Comfortable  BP 140/68 mmHg  Pulse 57  Temp(Src) 98.6 F (37 C) (Oral)  Resp 20  Ht 5\' 10"  (1.778 m)  Wt 200 lb 2.8 oz (90.8 kg)  BMI 28.72 kg/m2  SpO2 100%   Intake/Output Summary (Last 24 hours) at 09/23/15 1813 Last data filed at 09/23/15 1600  Gross per 24 hour  Intake 1489.35 ml  Output    875 ml  Net 614.35 ml    Delirium improved  Awaiting bed on 2 west  Steven C. Dorris Fetch, MD [facility] [phone]

## 2015-09-23 NOTE — Progress Notes (Addendum)
Hypoglycemic Event  CBG: 69  Treatment: 15 GM carbohydrate snack  Symptoms: Sweaty and Shaky  Follow-up CBG: Time: 1215 CBG Result: 78  Possible Reasons for Event: Inadequate meal intake   Ian Morton M

## 2015-09-23 NOTE — Progress Notes (Signed)
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 (N/A) TRANSESOPHAGEAL ECHOCARDIOGRAM (TEE) (N/A) Subjective:  No complaints  He is awake and talking, following commands this am. Precedex is off. No Ativan given overnight.  Objective: Vital signs in last 24 hours: Temp:  [97.3 F (36.3 C)-100.5 F (38.1 C)] 97.3 F (36.3 C) (10/19 0732) Pulse Rate:  [57-80] 57 (10/19 0400) Cardiac Rhythm:  [-] Normal sinus rhythm (10/19 0826) Resp:  [18-41] 21 (10/19 0630) BP: (113-155)/(63-94) 135/70 mmHg (10/19 0630) SpO2:  [99 %-100 %] 100 % (10/19 0630) Weight:  [90.8 kg (200 lb 2.8 oz)] 90.8 kg (200 lb 2.8 oz) (10/19 0428)  Hemodynamic parameters for last 24 hours:    Intake/Output from previous day: 10/18 0701 - 10/19 0700 In: 2120.5 [I.V.:2120.5] Out: 975 [Urine:975] Intake/Output this shift: Total I/O In: 28.4 [I.V.:28.4] Out: 300 [Urine:300]  General appearance: alert and cooperative Neurologic: intact Heart: regular rate and rhythm, S1, S2 normal, no murmur, click, rub or gallop Lungs: diminished breath sounds bibasilar Extremities: extremities normal, atraumatic, no cyanosis or edema Wound: incision ok  Lab Results:  Recent Labs  09/21/15 0405 09/23/15 0425  WBC 12.0* 9.3  HGB 8.7* 8.5*  HCT 27.7* 27.0*  PLT 123* 130*   BMET:  Recent Labs  09/21/15 0405 09/23/15 0425  NA 132* 140  K 3.7 4.1  CL 98* 105  CO2 28 26  GLUCOSE 166* 156*  BUN 21* 17  CREATININE 1.17 0.78  CALCIUM 8.0* 8.5*    PT/INR: No results for input(s): LABPROT, INR in the last 72 hours. 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 (N/A) TRANSESOPHAGEAL ECHOCARDIOGRAM (TEE) (N/A)  He is hemodynamically stable  His delirium is much improved and I think it will be best to transfer him to 2W circle to avoid the constant stimulation of the ICU.   Continue mobilization, IS.  Resume diet and oral meds.   LOS: 9 days    Alleen Borne 09/23/2015

## 2015-09-24 LAB — CBC
HEMATOCRIT: 30.7 % — AB (ref 39.0–52.0)
Hemoglobin: 9.5 g/dL — ABNORMAL LOW (ref 13.0–17.0)
MCH: 24.2 pg — ABNORMAL LOW (ref 26.0–34.0)
MCHC: 30.9 g/dL (ref 30.0–36.0)
MCV: 78.3 fL (ref 78.0–100.0)
PLATELETS: 194 10*3/uL (ref 150–400)
RBC: 3.92 MIL/uL — ABNORMAL LOW (ref 4.22–5.81)
RDW: 17.6 % — AB (ref 11.5–15.5)
WBC: 10.3 10*3/uL (ref 4.0–10.5)

## 2015-09-24 LAB — BASIC METABOLIC PANEL
Anion gap: 8 (ref 5–15)
BUN: 18 mg/dL (ref 6–20)
CALCIUM: 8.8 mg/dL — AB (ref 8.9–10.3)
CO2: 26 mmol/L (ref 22–32)
CREATININE: 0.87 mg/dL (ref 0.61–1.24)
Chloride: 105 mmol/L (ref 101–111)
GFR calc Af Amer: >60 mL/min (ref >60)
GLUCOSE: 154 mg/dL — AB (ref 65–99)
Potassium: 4.2 mmol/L (ref 3.5–5.1)
Sodium: 139 mmol/L (ref 135–145)

## 2015-09-24 LAB — GLUCOSE, CAPILLARY
GLUCOSE-CAPILLARY: 98 mg/dL (ref 65–99)
GLUCOSE-CAPILLARY: 121 mg/dL — AB (ref 65–99)
GLUCOSE-CAPILLARY: 103 mg/dL — AB (ref 65–99)
Glucose-Capillary: 106 mg/dL — ABNORMAL HIGH (ref 65–99)

## 2015-09-24 MED ORDER — ACETAMINOPHEN 500 MG PO TABS
1000.0000 mg | ORAL_TABLET | Freq: Four times a day (QID) | ORAL | Status: DC | PRN
  Administered 2015-09-24 – 2015-09-28 (×6): 1000 mg via ORAL
  Filled 2015-09-24 (×6): qty 2

## 2015-09-24 NOTE — Progress Notes (Addendum)
Reported attempted to be called, RN to call back. Darrel Hoover 10:27 AM   Report called to [place]. 10:38 AM

## 2015-09-24 NOTE — Progress Notes (Signed)
CARDIAC REHAB PHASE I   PRE:  Rate/Rhythm: 65 SR  BP:  Sitting: 144/70        SaO2: 99 1L  MODE:  Ambulation: 250 ft   POST:  Rate/Rhythm: 80 SR  BP:  Sitting: 161/74         SaO2: 100 1L  Pt needed encouragement to ambulate, states he is waiting for his lunch. Demonstrated use of sternal precautions with assistance and some cues. Pt ambulated 250 ft on 1L O2, rolling walker, gait belt, assist x2, fairly steady gait, tolerated well. Pt c/o DOE (did have some mild wheezing), denies CP, dizziness, brief standing rest x1. Pt is somewhat impulsive and is difficult to understand therefore it is somewhat difficult to understand his level of overall comprehension but pt did follow commands and seemed to answer questions appropriately. Pt to recliner after walk, feet elevated, call bell within reach. Pt demonstrated use of IS, needed reminder of proper use. Encouraged ambulation. Will follow. Can be x1 assist.   [phone]  Joylene Grapes, RN, BSN 09/24/2015 2:44 PM

## 2015-09-24 NOTE — Progress Notes (Signed)
Pt arrived on floor from 2S via [facility] with RN.  Pt placed on telemetry box and CCMD notified.  Pt oriented to room.  Education provided on call light and telephone.  Pt arrived with sitter d/t confusion and impulsivity with getting out of bed.  Pt states pain level to chest area 7/10.  Pt had received pain medication prior to being transferred.  Will continue to monitor pt needs.

## 2015-09-24 NOTE — Clinical Social Work Note (Signed)
Patient transferred from 2S to 2W.  2S CSW provided handoff to covering 2W CSW.  Patient's wife is requesting SNF placement at time of discharge, but is aware of barriers: Medicaid payer source, PT recommendations of home health, etc...  Vickii Penna, LCSW [phone]  [facility] Licensed Clinical Social Worker

## 2015-09-24 NOTE — Progress Notes (Signed)
Utilization review completed.  

## 2015-09-24 NOTE — Progress Notes (Signed)
Pt transferred to 2West 13, vss, patient settled in bed, Building services engineer at bedside. Darrel Hoover

## 2015-09-24 NOTE — Progress Notes (Signed)
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 (N/A) TRANSESOPHAGEAL ECHOCARDIOGRAM (TEE) (N/A) Subjective: No complaints  Objective: Vital signs in last 24 hours: Temp:  [98.6 F (37 C)-99.4 F (37.4 C)] 98.7 F (37.1 C) (10/20 0336) Pulse Rate:  [83-99] 99 (10/19 2300) Cardiac Rhythm:  [-] Normal sinus rhythm (10/20 0600) Resp:  [16-36] 26 (10/20 0700) BP: (90-183)/(53-121) 157/75 mmHg (10/20 0600) SpO2:  [95 %-100 %] 100 % (10/20 0700) Weight:  [90.2 kg (198 lb 13.7 oz)] 90.2 kg (198 lb 13.7 oz) (10/20 0545)  Hemodynamic parameters for last 24 hours:    Intake/Output from previous day: 10/19 0701 - 10/20 0700 In: 448.4 [P.O.:420; I.V.:28.4] Out: 1250 [Urine:1250] Intake/Output this shift:    General appearance: alert and cooperative Heart: regular rate and rhythm, S1, S2 normal, no murmur, click, rub or gallop Lungs: clear to auscultation bilaterally Wound: incision ok  Lab Results:  Recent Labs  09/23/15 0425 09/24/15 0350  WBC 9.3 10.3  HGB 8.5* 9.5*  HCT 27.0* 30.7*  PLT 130* 194   BMET:  Recent Labs  09/23/15 0425 09/24/15 0350  NA 140 139  K 4.1 4.2  CL 105 105  CO2 26 26  GLUCOSE 156* 154*  BUN 17 18  CREATININE 0.78 0.87  CALCIUM 8.5* 8.8*    PT/INR: No results for input(s): LABPROT, INR in the last 72 hours. 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 (N/A) TRANSESOPHAGEAL ECHOCARDIOGRAM (TEE) (N/A)  He remains hemodynamically stable Delirium improved and ready to go to 2W. He could not get a  circle bed yesterday so will send to a regular bed on 2W. Mobilize Diabetes control   LOS: 10 days    Ian Morton 09/24/2015

## 2015-09-25 LAB — GLUCOSE, CAPILLARY
GLUCOSE-CAPILLARY: 107 mg/dL — AB (ref 65–99)
Glucose-Capillary: 115 mg/dL — ABNORMAL HIGH (ref 65–99)
Glucose-Capillary: 90 mg/dL (ref 65–99)
Glucose-Capillary: 123 mg/dL — ABNORMAL HIGH (ref 65–99)

## 2015-09-25 MED ORDER — HYDROCORTISONE 1 % EX OINT
TOPICAL_OINTMENT | Freq: Three times a day (TID) | CUTANEOUS | Status: DC
  Administered 2015-09-25: 1 via TOPICAL
  Administered 2015-09-25 – 2015-09-28 (×8): via TOPICAL
  Filled 2015-09-25: qty 28.35

## 2015-09-25 MED ORDER — LISINOPRIL 2.5 MG PO TABS
2.5000 mg | ORAL_TABLET | Freq: Every day | ORAL | Status: DC
  Administered 2015-09-25: 2.5 mg via ORAL
  Filled 2015-09-25: qty 1

## 2015-09-25 MED ORDER — LACTULOSE 10 GM/15ML PO SOLN
20.0000 g | Freq: Every day | ORAL | Status: DC | PRN
  Administered 2015-09-25: 20 g via ORAL
  Filled 2015-09-25: qty 30

## 2015-09-25 NOTE — Progress Notes (Signed)
Physical Therapy Treatment Patient Details Name: Ian Morton MRN: [id number] DOB: [birth year] Today's Date: 09/25/2015    History of Present Illness pt is a 64 y/o male admitted with CP and had cardiac markers showing NSTEMI.  Pt s/p off pump CABG x1.  PMHx of MI, vertigo.    PT Comments    Progressing slowly.  Pt not following sternal precautions after repetitive cuing and redirecting.  Follow Up Recommendations  Supervision/Assistance - 24 hour;SNF     Equipment Recommendations       Recommendations for Other Services       Precautions / Restrictions Precautions Precautions: Fall;Sternal    Mobility  Bed Mobility Overal bed mobility: Needs Assistance Bed Mobility: Supine to Sit;Sit to Supine     Supine to sit: Min assist Sit to supine: Min assist   General bed mobility comments: Frequent discussion of sternal precautions, but pt not following   Transfers Overall transfer level: Needs assistance Equipment used: 1 person hand held assist Transfers: Sit to/from [employer] Sit to Stand: Min assist Stand pivot transfers: Min assist       General transfer comment: consistent cuing for sternal precautions with pt quickly forgeting.  Ambulation/Gait Ambulation/Gait assistance: Min assist Ambulation Distance (Feet): 200 Feet (with 1 long standing rest break.) Assistive device: 1 person hand held assist Gait Pattern/deviations: Step-through pattern;Trunk flexed Gait velocity: slow Gait velocity interpretation: Below normal speed for age/gender General Gait Details: generally steady, but occasional slump over to lean onto his elbows on the rW   Stairs            Wheelchair Mobility    Modified Rankin (Stroke Patients Only)       Balance Overall balance assessment: Needs assistance Sitting-balance support: No upper extremity supported;Single extremity supported Sitting balance-Leahy Scale: Fair     Standing balance support: Bilateral  upper extremity supported;Single extremity supported Standing balance-Leahy Scale: Poor Standing balance comment: reliant on the RW                    Cognition Arousal/Alertness: Awake/alert Behavior During Therapy: WFL for tasks assessed/performed Overall Cognitive Status: Impaired/Different from baseline Area of Impairment: Attention;Following commands;Safety/judgement;Problem solving;Awareness   Current Attention Level: Sustained   Following Commands: Follows one step commands with increased time;Follows one step commands inconsistently Safety/Judgement: Decreased awareness of safety;Decreased awareness of deficits Awareness: Intellectual Problem Solving: Slow processing;Difficulty sequencing;Requires tactile cues;Requires verbal cues      Exercises      General Comments General comments (skin integrity, edema, etc.): SpO2  98% on RA, EHR up to higher 90's      Pertinent Vitals/Pain Pain Assessment: Faces Faces Pain Scale: Hurts little more Pain Location: chest Pain Descriptors / Indicators: Grimacing Pain Intervention(s): Monitored during session    Home Living                      Prior Function            PT Goals (current goals can now be found in the care plan section) Acute Rehab PT Goals Patient Stated Goal: pt still not interested in goals. PT Goal Formulation: Patient unable to participate in goal setting Time For Goal Achievement: 10/07/15 Potential to Achieve Goals: Good Progress towards PT goals: Progressing toward goals    Frequency  Min 3X/week    PT Plan Current plan remains appropriate    Co-evaluation             End of Session  Activity Tolerance: Patient tolerated treatment well Patient left: in bed;with call bell/phone within reach     Time: 1400-1422 PT Time Calculation (min) (ACUTE ONLY): 22 min  Charges:  $Gait Training: 8-22 mins                    G Codes:      Preslyn Warr, Eliseo Gum 09/25/2015,  3:36 PM 09/25/2015  No Name Bing, PT [phone]  (pager)

## 2015-09-25 NOTE — Discharge Summary (Signed)
Physician Discharge Summary  Patient ID: Ian Morton MRN: [id number] DOB/AGE: 1951-04-05 64 y.o.  Admit date: 09/14/2015 Discharge date: 09/25/2015  Admission Diagnoses: NSTEMI  Discharge Diagnoses:  Principal Problem:   NSTEMI (non-ST elevated myocardial infarction) [facility]) Active Problems:   Coronary artery disease   MI (myocardial infarction) (HCC)   Chest pain   S/P CABG x 1  Patient Active Problem List   Diagnosis Date Noted  . 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 Chest pain 09/14/2015  . NSTEMI (non-ST elevated myocardial infarction) (HCC) 09/14/2015  . Other B-complex deficiencies 04/04/2014  . Fissure in skin of foot 01/27/2014  . Unspecified local infection of skin and subcutaneous tissue 01/27/2014  . Erythema of toe 01/27/2014  . Dyspnea and respiratory abnormality 11/05/2013  . Restrictive lung disease 11/05/2013  . Dyslipidemia   . GERD (gastroesophageal reflux disease)   . Coronary artery disease   . MI (myocardial infarction) (HCC)   . Memory loss 09/02/2013  . Tinea pedis 04/22/2013  . Pain in joint, ankle and foot 02/20/2013  . Dry skin 02/20/2013  . Sprain of lumbosacral (joint) (ligament) 01/22/2013  . Sprain of neck 01/22/2013  . Abdominal pain 12/21/2011  . Hypertension 12/21/2011  . Diabetes mellitus 12/21/2011    History of the present illness: At time of admission   Ian Morton is a 64 y.o. male With history of known coronary artery disease, myocardial infarction in 2009, has had angioplasty with drug-eluting stent implantation to proximal LAD, details not completely available although patient states that he has 4 stents.  He was doing well until yesterday while he was shopping and doing his routine activity, suddenly developed severe chest pain with marked diaphoresis and nausea, chest discomfort similar to his angina pectoris in the past. Patient has had history of chronic stable angina pectoris for a while. Due to his symptoms, EMS was activated,  patient was brought to the emergency room where on aggressive medical therapy his chest pain subsided, however his cardiac markers were positive for non-ST elevation myocardial infarctions/acute coronary syndrome. Hence is being admitted for further evaluation.  Patient states that this morning he has not had any recurrence of chest pain. He's been noticing worsening dyspnea on exertion, he also has history of bronchial asthma he sees Dr. Marcelyn Bruins for the same.  The patient was admitted for further evaluation and treatment.  Discharged Condition: good  Hospital Course: The patient was admitted for further evaluation and treatment to include cardiac catheterization. He did rule in for non-ST segment elevation myocardial infarction. Catheterization showed a 90 plus percent huge ostial LAD lesion just proximal to the previous stent that is widely patent. Left ventricular ejection fraction was 65%. Due to these findings cardiothoracic surgical consultation was obtained with Dr. Laneta Simmers who evaluated the patient and studies and agreed with recommendations to proceed with coronary artery surgical revascularization. Postoperatively overall the patient has done well from a surgical perspective. He did have some difficulty with postoperative delirium and confusion which has been steadily improving to his baseline. It is felt this may be related to preop polypharmacy as he was on numerous medications. He is currently quite stable from a hemodynamic viewpoint. Incisions are healing well without evidence of infection. He is tolerating diet and routine activities using standard cardiac rehabilitation protocols. Oxygen has been weaned and he maintains good saturations on room air. He is maintaining normal sinus rhythm. In discussion with his family it is felt that he would benefit from short-term skilled nursing facility  to continue his ongoing recovery. This has been arranged in the assistance of social work. All  routine lines, monitors and drainage devices have been discontinued in the standard fashion. He is overall felt to be stable for discharge to the facility on today's date.  Consults: None  Significant Diagnostic Studies: angiography: Cardiac catheterization  Treatments: surgery:   CARDIOVASCULAR SURGERY OPERATIVE NOTE  09/18/2015  Surgeon: Alleen Borne, MD  First Assistant: Lowella Dandy, PA-C   Preoperative Diagnosis: Severe proximal LAD coronary artery disease   Postoperative Diagnosis: Same   Procedure:  1. Median Sternotomy  2. Off pump coronary artery bypass grafting x 1   Left internal mammary graft to the LAD    Anesthesia: General Endotracheal    Discharge Exam: Blood pressure 119/71, pulse 73, temperature 97.6 F (36.4 C), temperature source Oral, resp. rate 18, height  (1.778 m), weight 195 lb 12.3 oz (88.8 kg), SpO2 100 %.  General appearance: alert, cooperative and no distress Heart: regular rate and rhythm Lungs: clear to auscultation bilaterally Abdomen: benign Extremities: no edema Wound: incis healing well   Disposition:   Medications at time of discharge:   Medication List    STOP taking these medications        acetaminophen 500 MG tablet  Commonly known as:  TYLENOL     aspirin 81 MG tablet  Replaced by:  aspirin 325 MG EC tablet     clopidogrel 75 MG tablet  Commonly known as:  PLAVIX     clotrimazole-betamethasone cream  Commonly known as:  LOTRISONE     dicyclomine 20 MG tablet  Commonly known as:  BENTYL     ketoconazole 2 % cream  Commonly known as:  NIZORAL     levofloxacin 500 MG tablet  Commonly known as:  LEVAQUIN     nitroGLYCERIN 0.4 MG SL tablet  Commonly known as:  NITROSTAT     pentoxifylline 400 MG CR tablet  Commonly known as:  TRENTAL     polyethylene glycol powder powder  Commonly known as:  MIRALAX     RAPAFLO 8 MG Caps capsule  Generic drug:  silodosin     sucralfate 1  GM/10ML suspension  Commonly known as:  CARAFATE     terbinafine 250 MG tablet  Commonly known as:  LAMISIL      TAKE these medications        ADVAIR HFA 230-21 MCG/ACT inhaler  Generic drug:  fluticasone-salmeterol  Inhale 2 puffs into the lungs 2 (two) times daily.     ammonium lactate 12 % cream  Commonly known as:  AMLACTIN  APPLY TO AFFECTED AREA AS NEEDED FOR DRY SKIN     aspirin 325 MG EC tablet  Take 1 tablet (325 mg total) by mouth daily.     atorvastatin 80 MG tablet  Commonly known as:  LIPITOR  Take 1 tablet (80 mg total) by mouth daily at 6 PM.     cholecalciferol 1000 UNITS tablet  Commonly known as:  VITAMIN D  Take 1,000 Units by mouth daily.     clotrimazole 1 % cream  Commonly known as:  LOTRIMIN  APPLY TO AFFECTED AREA TWICE DAILY     DULoxetine 60 MG capsule  Commonly known as:  CYMBALTA  Take 60 mg by mouth daily.     fluticasone 50 MCG/ACT nasal spray  Commonly known as:  FLONASE  Place 2 sprays into both nostrils daily.     hydrocortisone 1 % ointment  Apply topically 3 (three) times daily. Left forearm rash     LINZESS 145 MCG Caps capsule  Generic drug:  Linaclotide  Take 145 mcg by mouth daily.     lisinopril 10 MG tablet  Commonly known as:  PRINIVIL,ZESTRIL  Take 1 tablet (10 mg total) by mouth daily.     loratadine 10 MG tablet  Commonly known as:  CLARITIN  Take 10 mg by mouth daily.     metFORMIN 500 MG tablet  Commonly known as:  GLUCOPHAGE  Take 500 mg by mouth daily with breakfast.     metoprolol tartrate 25 MG tablet  Commonly known as:  LOPRESSOR  Take 0.5 tablets (12.5 mg total) by mouth 2 (two) times daily.     montelukast 10 MG tablet  Commonly known as:  SINGULAIR  Take 10 mg by mouth at bedtime.     omeprazole 20 MG capsule  Commonly known as:  PRILOSEC  Take 20 mg by mouth daily.     pregabalin 100 MG capsule  Commonly known as:  LYRICA  Take 100 mg by mouth 3 (three) times daily.     PROAIR HFA 108  (90 BASE) MCG/ACT inhaler  Generic drug:  albuterol  Inhale 2 puffs into the lungs every 6 (six) hours as needed.     tamsulosin 0.4 MG Caps capsule  Commonly known as:  FLOMAX  Take 0.4 mg by mouth at bedtime.     VITAMIN B COMPLEX-C Caps  Frequency:Daily   Dosage:1     Instructions:  Note:TAKE 1 CAPSULE DAILY.            Follow-up Information    Follow up with Alleen Borne, MD.   Specialty:  Cardiothoracic Surgery   Contact information:   [address] [place] [address] [phone]       Follow up with Yates Decamp, MD.   Specialty:  Cardiology   Why:  Please contact Dr. Jacinto Halim  office to arrange an appointment to see him in 2 weeks.   Contact information:   [address] [place] [address] [phone]     The patient has been discharged on:   1.Beta Blocker:  Yes [  y ]                              No   [   ]                              If No, reason:  2.Ace Inhibitor/ARB: Yes [place].Etienne   ]                                     No  [    ]                                     If No, reason:  3.Statin:   Yes [ y  ]                  No  [   ]                  If No, reason:  4.Ecasa:  Yes  [  y ]                  No   [   ]                  If No, reason:  1. Please obtain vital signs at least one time daily 2.Please weigh the patient daily. If he or she continues to gain weight or develops lower extremity edema, contact the office at [phone]. 3. Ambulate patient at least three times daily and please use sternal precautions.    Signed: GOLD,WAYNE E 09/25/2015, 9:03 AM

## 2015-09-25 NOTE — Discharge Instructions (Signed)
Coronary Artery Bypass Grafting, Care After °These instructions give you information on caring for yourself after your procedure. Your doctor may also give you more specific instructions. Call your doctor if you have any problems or questions after your procedure.  °HOME CARE °· Only take medicine as told by your doctor. Take medicines exactly as told. Do not stop taking medicines or start any new medicines without talking to your doctor first. °· Take your pulse as told by your doctor. °· Do deep breathing as told by your doctor. Use your breathing device (incentive spirometer), if given, to practice deep breathing several times a day. Support your chest with a pillow or your arms when you take deep breaths or cough. °· Keep the area clean, dry, and protected where the surgery cuts (incisions) were made. Remove bandages (dressings) only as told by your doctor. If strips were applied to surgical area, do not take them off. They fall off on their own. °· Check the surgery area daily for puffiness (swelling), redness, or leaking fluid. °· If surgery cuts were made in your legs: °¨ Avoid crossing your legs. °¨ Avoid sitting for long periods of time. Change positions every 30 minutes. °¨ Raise your legs when you are sitting. Place them on pillows. °· Wear stockings that help keep blood clots from forming in your legs (compression stockings). °· Only take sponge baths until your doctor says it is okay to take showers. Pat the surgery area dry. Do not rub the surgery area with a washcloth or towel. Do not bathe, swim, or use a hot tub until your doctor says it is okay. °· Eat foods that are high in fiber. These include raw fruits and vegetables, whole grains, beans, and nuts. Choose lean meats. Avoid canned, processed, and fried foods. °· Drink enough fluids to keep your pee (urine) clear or pale yellow. °· Weigh yourself every day. °· Rest and limit activity as told by your doctor. You may be told to: °¨ Stop any  activity if you have chest pain, shortness of breath, changes in heartbeat, or dizziness. Get help right away if this happens. °¨ Move around often for short amounts of time or take short walks as told by your doctor. Gradually become more active. You may need help to strengthen your muscles and build endurance. °¨ Avoid lifting, pushing, or pulling anything heavier than 10 pounds (4.5 kg) for at least 6 weeks after surgery. °· Do not drive until your doctor says it is okay. °· Ask your doctor when you can go back to work. °· Ask your doctor when you can begin sexual activity again. °· Follow up with your doctor as told. °GET HELP IF: °· You have puffiness, redness, more pain, or fluid draining from the incision site. °· You have a fever. °· You have puffiness in your ankles or legs. °· You have pain in your legs. °· You gain 2 or more pounds (0.9 kg) a day. °· You feel sick to your stomach (nauseous) or throw up (vomit). °· You have watery poop (diarrhea). °GET HELP RIGHT AWAY IF: °· You have chest pain that goes to your jaw or arms. °· You have shortness of breath. °· You have a fast or irregular heartbeat. °· You notice a "clicking" in your breastbone when you move. °· You have numbness or weakness in your arms or legs. °· You feel dizzy or light-headed. °MAKE SURE YOU: °· Understand these instructions. °· Will watch your condition. °· Will get   help right away if you are not doing well or get worse. °  °This information is not intended to replace advice given to you by your health care provider. Make sure you discuss any questions you have with your health care provider. °  °Document Released: 11/26/2013 Document Reviewed: 11/26/2013 °Elsevier Interactive Patient Education ©2016 [employer] ° °

## 2015-09-25 NOTE — Progress Notes (Addendum)
[address]       [place] [address]             [phone]      7 Days Post-Op Procedure(s) (LRB): OFF PUMP CORONARY ARTERY BYPASS GRAFTING (CABG)X 1 using left internal mammory artery. (N/A) TRANSESOPHAGEAL ECHOCARDIOGRAM (TEE) (N/A) Subjective: Feeling better delirium conts to improve but still looks a bit agitated moving around in the bed like he cant get comfortable  Objective: Vital signs in last 24 hours: Temp:  [97.3 F (36.3 C)-98.4 F (36.9 C)] 97.6 F (36.4 C) (10/21 0528) Pulse Rate:  [68-73] 73 (10/21 0528) Cardiac Rhythm:  [-] Normal sinus rhythm (10/21 0700) Resp:  [17-20] 18 (10/21 0528) BP: (119-158)/(70-84) 119/71 mmHg (10/21 0528) SpO2:  [95 %-100 %] 100 % (10/21 0528) Weight:  [195 lb 12.3 oz (88.8 kg)] 195 lb 12.3 oz (88.8 kg) (10/21 0500)  Hemodynamic parameters for last 24 hours:    Intake/Output from previous day: 10/20 0701 - 10/21 0700 In: 600 [P.O.:600] Out: -  Intake/Output this shift:    General appearance: alert, cooperative, distracted and no distress Heart: regular rate and rhythm Lungs: coarse, with inspiratory  pseudo-wheeze Abdomen: soft, non-tender Extremities: no edema Wound: incis healing well, sternum stable  Lab Results:  Recent Labs  09/23/15 0425 09/24/15 0350  WBC 9.3 10.3  HGB 8.5* 9.5*  HCT 27.0* 30.7*  PLT 130* 194   BMET:  Recent Labs  09/23/15 0425 09/24/15 0350  NA 140 139  K 4.1 4.2  CL 105 105  CO2 26 26  GLUCOSE 156* 154*  BUN 17 18  CREATININE 0.78 0.87  CALCIUM 8.5* 8.8*    PT/INR: No results for input(s): LABPROT, INR in the last 72 hours. ABG    Component Value Date/Time   PHART 7.301* 09/19/2015 0152   HCO3 25.0* 09/19/2015 0152   TCO2 23 09/19/2015 1707   ACIDBASEDEF 2.0 09/19/2015 0152   O2SAT 97.0 09/19/2015 0152   CBG (last 3)   Recent Labs  09/24/15 1648 09/24/15 2021 09/25/15 0628  GLUCAP 121* 103* 115*    Meds Scheduled Meds: . aspirin EC   325 mg Oral Daily  . atorvastatin  80 mg Oral q1800  . dicyclomine  20 mg Oral BID  . DULoxetine  60 mg Oral Daily  . enoxaparin (LOVENOX) injection  40 mg Subcutaneous QHS  . fluticasone  2 spray Each Nare Daily  . insulin aspart  0-24 Units Subcutaneous TID AC & HS  . Linaclotide  145 mcg Oral QAC breakfast  . loratadine  10 mg Oral Daily  . metoprolol tartrate  12.5 mg Oral BID  . mometasone-formoterol  2 puff Inhalation BID  . montelukast  10 mg Oral QHS  . pregabalin  100 mg Oral TID  . sodium chloride  3 mL Intravenous Q12H  . tamsulosin  0.4 mg Oral QHS  . terbinafine  250 mg Oral Daily   Continuous Infusions:  PRN Meds:.sodium chloride, acetaminophen, albuterol, ketorolac, sodium chloride  Xrays No results found.  Assessment/Plan: S/P Procedure(s) (LRB): OFF PUMP CORONARY ARTERY BYPASS GRAFTING (CABG)X 1 using left internal mammory artery. (N/A) TRANSESOPHAGEAL ECHOCARDIOGRAM (TEE) (N/A)  1 conts to improve 2 Needs rehab/pulm toilet 3 sugars controlled 4 he was on 30 medications preop but seems medically stable on current rx   LOS: 11 days    GOLD,WAYNE E 09/25/2015   Chart reviewed, patient examined, agree with above. He is stable overall. Awaiting decision  about SNF placement.

## 2015-09-25 NOTE — Progress Notes (Signed)
CSW called wife to discuss placement options- Patient wife is only interested in [employer] facilities- CSW informed there is only one offer in [place] at this time.  Pt wife continued to state need for SNF placement- states she has chronic back pain and can not take care of the patient at home.  CSW will continue to follow.  Merlyn Lot, LCSWA Clinical Social Worker [phone]

## 2015-09-25 NOTE — Progress Notes (Signed)
Pt sitting up and moving around does not use the heart pillow as he should. He has been educuated on the importance of the pillow but it needs to be reinforced. Will continue to monitor

## 2015-09-25 NOTE — Progress Notes (Signed)
CARDIAC REHAB PHASE I   Pt lying in bed, lights off in room. Attempted to ambulate with pt, pt states "I am sick, I don't want to walk." Explained importance of ambulation with pt, pt increasingly agitated, states "Go away, I don't want to talk to anybody." RN notified, encouraged ambulation with pt later today if possible. Will follow-up tomorrow.   Joylene Grapes, RN, BSN 09/25/2015 1:41 PM

## 2015-09-25 NOTE — Clinical Social Work Placement (Signed)
   CLINICAL SOCIAL WORK PLACEMENT  NOTE  Date:  09/25/2015  Patient Details  Name: Ian Morton MRN: [id number] Date of Birth: 1951-02-17  Clinical Social Work is seeking post-discharge placement for this patient at the Skilled  Nursing Facility level of care (*CSW will initial, date and re-position this form in  chart as items are completed):  Yes   Patient/family provided with [facility] Clinical Social Work Department's list of facilities offering this level of care within the geographic area requested by the patient (or if unable, by the patient's family).  Yes   Patient/family informed of their freedom to choose among providers that offer the needed level of care, that participate in Medicare, Medicaid or managed care program needed by the patient, have an available bed and are willing to accept the patient.  Yes   Patient/family informed of [facility]'s ownership interest in [facility] and [facility], as well as of the fact that they are under no obligation to receive care at these facilities.  PASRR submitted to EDS on       PASRR number received on       Existing PASRR number confirmed on 09/25/15     FL2 transmitted to all facilities in geographic area requested by pt/family on 09/25/15     FL2 transmitted to all facilities within larger geographic area on       Patient informed that his/her managed care company has contracts with or will negotiate with certain facilities, including the following:            Patient/family informed of bed offers received.  Patient chooses bed at       Physician recommends and patient chooses bed at      Patient to be transferred to   on  .  Patient to be transferred to facility by       Patient family notified on   of transfer.  Name of family member notified:        PHYSICIAN Please sign FL2     Additional Comment:    _______________________________________________ Izora Ribas, LCSW 09/25/2015, 10:39 AM

## 2015-09-26 LAB — GLUCOSE, CAPILLARY
GLUCOSE-CAPILLARY: 136 mg/dL — AB (ref 65–99)
GLUCOSE-CAPILLARY: 82 mg/dL (ref 65–99)
Glucose-Capillary: 108 mg/dL — ABNORMAL HIGH (ref 65–99)
Glucose-Capillary: 103 mg/dL — ABNORMAL HIGH (ref 65–99)

## 2015-09-26 MED ORDER — LISINOPRIL 10 MG PO TABS
10.0000 mg | ORAL_TABLET | Freq: Every day | ORAL | Status: DC
  Administered 2015-09-26 – 2015-09-28 (×3): 10 mg via ORAL
  Filled 2015-09-26 (×3): qty 1

## 2015-09-26 NOTE — Progress Notes (Signed)
Assisted to ambulate in hall 275 feet with front wheel walker, gait belt. Pt tolerated well, taking one brief standing rest break.

## 2015-09-26 NOTE — Progress Notes (Signed)
CARDIAC REHAB PHASE I   PRE:  Rate/Rhythm: 79 SR  BP:  Supine:   Sitting: 104/60  Standing:    SaO2: 97% RA  MODE:  Ambulation: 200 ft   POST:  Rate/Rhythm: 85 SR  BP:  Supine:   Sitting: 119/82  Standing:    SaO2: 96 RA Very sleepy today.  Walked with assistance x 2, gait belt, and rolling walker.  Constantly reminded to open eyes, and stand up straight.  Knees buckled once.   Oxygen saturations great.  Unable to do any education due to patient's mental status, no family present.   [phone]  Cindra Eves RN, BSN 09/26/2015 10:04 AM

## 2015-09-26 NOTE — Progress Notes (Addendum)
[address]       [place] [address]             [phone]      8 Days Post-Op Procedure(s) (LRB): OFF PUMP CORONARY ARTERY BYPASS GRAFTING (CABG)X 1 using left internal mammory artery. (N/A) TRANSESOPHAGEAL ECHOCARDIOGRAM (TEE) (N/A) Subjective: Feels fair, more confused and lethargic this am, did eat whole breakfast   Objective: Vital signs in last 24 hours: Temp:  [97.6 F (36.4 C)-98.6 F (37 C)] 97.6 F (36.4 C) (10/22 0451) Pulse Rate:  [66-77] 77 (10/22 0451) Cardiac Rhythm:  [-] Normal sinus rhythm (10/22 0900) Resp:  [18-19] 18 (10/22 0451) BP: (111-148)/(52-86) 133/78 mmHg (10/22 0451) SpO2:  [98 %-100 %] 98 % (10/22 0900) Weight:  [191 lb (86.637 kg)] 191 lb (86.637 kg) (10/22 0500)  Hemodynamic parameters for last 24 hours:    Intake/Output from previous day: 10/21 0701 - 10/22 0700 In: 720 [P.O.:720] Out: -  Intake/Output this shift:    General appearance: distracted, fatigued and no distress Neurologic: intact and confused Heart: regular rate and rhythm Lungs: clear to auscultation bilaterally Abdomen: benign Extremities: no sig edema Wound: incis healing well  Lab Results:  Recent Labs  09/24/15 0350  WBC 10.3  HGB 9.5*  HCT 30.7*  PLT 194   BMET:  Recent Labs  09/24/15 0350  NA 139  K 4.2  CL 105  CO2 26  GLUCOSE 154*  BUN 18  CREATININE 0.87  CALCIUM 8.8*    PT/INR: No results for input(s): LABPROT, INR in the last 72 hours. ABG    Component Value Date/Time   PHART 7.301* 09/19/2015 0152   HCO3 25.0* 09/19/2015 0152   TCO2 23 09/19/2015 1707   ACIDBASEDEF 2.0 09/19/2015 0152   O2SAT 97.0 09/19/2015 0152   CBG (last 3)   Recent Labs  09/25/15 1803 09/25/15 2110 09/26/15 0608  GLUCAP 123* 107* 108*    Meds Scheduled Meds: . aspirin EC  325 mg Oral Daily  . atorvastatin  80 mg Oral q1800  . dicyclomine  20 mg Oral BID  . DULoxetine  60 mg Oral Daily  . enoxaparin (LOVENOX) injection   40 mg Subcutaneous QHS  . fluticasone  2 spray Each Nare Daily  . hydrocortisone   Topical TID  . insulin aspart  0-24 Units Subcutaneous TID AC & HS  . Linaclotide  145 mcg Oral QAC breakfast  . lisinopril  2.5 mg Oral Daily  . loratadine  10 mg Oral Daily  . metoprolol tartrate  12.5 mg Oral BID  . mometasone-formoterol  2 puff Inhalation BID  . montelukast  10 mg Oral QHS  . pregabalin  100 mg Oral TID  . sodium chloride  3 mL Intravenous Q12H  . tamsulosin  0.4 mg Oral QHS  . terbinafine  250 mg Oral Daily   Continuous Infusions:  PRN Meds:.sodium chloride, acetaminophen, albuterol, ketorolac, lactulose, sodium chloride  Xrays No results found.  Assessment/Plan: S/P Procedure(s) (LRB): OFF PUMP CORONARY ARTERY BYPASS GRAFTING (CABG)X 1 using left internal mammory artery. (N/A) TRANSESOPHAGEAL ECHOCARDIOGRAM (TEE) (N/A)  1 more confused and lethargic this am but waxes and wanes in severity as was much more alert when eating breakfast per nurse. Not receiving narcotics. Sats good 2 sinus rhythm, increase ace inhib with htn. D/c bentyl 3 recheck labs in am     LOS: 12 days    Morton,Ian E 09/26/2015   Patient sitting up and talking about  his inhalers I have seen and examined Ian Morton and agree with the above assessment  and plan.  Delight Ovens MD Beeper [phone] Office [phone] 09/26/2015 12:39 PM

## 2015-09-26 NOTE — Progress Notes (Signed)
Pt ambulated with rolling walker in hall 275 feet. He became fatigued and short of breath at end of walk. He did not take any rest breaks during walk.

## 2015-09-27 LAB — COMPREHENSIVE METABOLIC PANEL
ALK PHOS: 66 U/L (ref 38–126)
ALT: 50 U/L (ref 17–63)
AST: 30 U/L (ref 15–41)
Albumin: 3 g/dL — ABNORMAL LOW (ref 3.5–5.0)
Anion gap: 6 (ref 5–15)
BUN: 21 mg/dL — AB (ref 6–20)
CO2: 28 mmol/L (ref 22–32)
CREATININE: 0.89 mg/dL (ref 0.61–1.24)
Calcium: 8.6 mg/dL — ABNORMAL LOW (ref 8.9–10.3)
Chloride: 101 mmol/L (ref 101–111)
Glucose, Bld: 124 mg/dL — ABNORMAL HIGH (ref 65–99)
POTASSIUM: 4.2 mmol/L (ref 3.5–5.1)
Sodium: 135 mmol/L (ref 135–145)
TOTAL PROTEIN: 5.8 g/dL — AB (ref 6.5–8.1)
Total Bilirubin: 0.4 mg/dL (ref 0.3–1.2)

## 2015-09-27 LAB — GLUCOSE, CAPILLARY
GLUCOSE-CAPILLARY: 109 mg/dL — AB (ref 65–99)
GLUCOSE-CAPILLARY: 153 mg/dL — AB (ref 65–99)
GLUCOSE-CAPILLARY: 118 mg/dL — AB (ref 65–99)
Glucose-Capillary: 105 mg/dL — ABNORMAL HIGH (ref 65–99)

## 2015-09-27 NOTE — Progress Notes (Signed)
   09/27/15 1453  Mobility  Activity Ambulate in hall;Bathroom privileges  Level of Journalist, newspaper Ambulated (ft) 500 ft  Ambulation Response Tolerated well

## 2015-09-27 NOTE — Progress Notes (Signed)
   09/27/15 1400  Mobility  Activity Ambulate in hall  Level of Assistance Independent  Assistive Device Front wheel walker  Distance Ambulated (ft) 500 ft  Ambulation Response Tolerated well

## 2015-09-27 NOTE — Progress Notes (Signed)
Assumed care from Laura RN.

## 2015-09-27 NOTE — Progress Notes (Addendum)
      [address]       [place] [address]             [phone]      9 Days Post-Op Procedure(s) (LRB): OFF PUMP CORONARY ARTERY BYPASS GRAFTING (CABG)X 1 using left internal mammory artery. (N/A) TRANSESOPHAGEAL ECHOCARDIOGRAM (TEE) (N/A) Subjective: C/o some constipation, much more alert this am, remains somewhat confused  Objective: Vital signs in last 24 hours: Temp:  [98 F (36.7 C)-98.4 F (36.9 C)] 98 F (36.7 C) (10/23 0559) Pulse Rate:  [73-85] 74 (10/23 0559) Cardiac Rhythm:  [-] Normal sinus rhythm (10/23 0700) Resp:  [17] 17 (10/23 0559) BP: (119-136)/(70-82) 136/73 mmHg (10/23 0559) SpO2:  [94 %-99 %] 95 % (10/23 0627) Weight:  [194 lb 1.6 oz (88.043 kg)] 194 lb 1.6 oz (88.043 kg) (10/23 0559)  Hemodynamic parameters for last 24 hours:    Intake/Output from previous day: 10/22 0701 - 10/23 0700 In: 240 [P.O.:240] Out: -  Intake/Output this shift: Total I/O In: 240 [P.O.:240] Out: -   General appearance: alert, cooperative and no distress Heart: regular rate and rhythm Lungs: clear to auscultation bilaterally Abdomen: benign Extremities: no edema Wound: incis healing well  Lab Results: No results for input(s): WBC, HGB, HCT, PLT in the last 72 hours. BMET:  Recent Labs  09/27/15 0650  NA 135  K 4.2  CL 101  CO2 28  GLUCOSE 124*  BUN 21*  CREATININE 0.89  CALCIUM 8.6*    PT/INR: No results for input(s): LABPROT, INR in the last 72 hours. ABG    Component Value Date/Time   PHART 7.301* 09/19/2015 0152   HCO3 25.0* 09/19/2015 0152   TCO2 23 09/19/2015 1707   ACIDBASEDEF 2.0 09/19/2015 0152   O2SAT 97.0 09/19/2015 0152   CBG (last 3)   Recent Labs  09/26/15 1610 09/26/15 2158 09/27/15 0620  GLUCAP 82 103* 109*    Meds Scheduled Meds: . aspirin EC  325 mg Oral Daily  . atorvastatin  80 mg Oral q1800  . DULoxetine  60 mg Oral Daily  . enoxaparin (LOVENOX) injection  40 mg Subcutaneous QHS  . fluticasone   2 spray Each Nare Daily  . hydrocortisone   Topical TID  . insulin aspart  0-24 Units Subcutaneous TID AC & HS  . Linaclotide  145 mcg Oral QAC breakfast  . lisinopril  10 mg Oral Daily  . loratadine  10 mg Oral Daily  . metoprolol tartrate  12.5 mg Oral BID  . mometasone-formoterol  2 puff Inhalation BID  . montelukast  10 mg Oral QHS  . pregabalin  100 mg Oral TID  . sodium chloride  3 mL Intravenous Q12H  . tamsulosin  0.4 mg Oral QHS  . terbinafine  250 mg Oral Daily   Continuous Infusions:  PRN Meds:.sodium chloride, acetaminophen, albuterol, lactulose, sodium chloride  Xrays No results found.  Assessment/Plan: S/P Procedure(s) (LRB): OFF PUMP CORONARY ARTERY BYPASS GRAFTING (CABG)X 1 using left internal mammory artery. (N/A) TRANSESOPHAGEAL ECHOCARDIOGRAM (TEE) (N/A)  1 doing well, much more alert today 2 labs stable, sugars controlled 3 on room air 4 sinus rhythm, BP better controlled 5 poss snf soon  LOS: 13 days    Ian Morton,Ian Morton 09/27/2015  Waiting for snf I have seen and examined Ian Morton and agree with the above assessment  and plan.  Delight Ovens MD Beeper [phone] Office [phone] 09/27/2015 12:30 PM

## 2015-09-28 LAB — GLUCOSE, CAPILLARY: Glucose-Capillary: 123 mg/dL — ABNORMAL HIGH (ref 65–99)

## 2015-09-28 MED ORDER — LISINOPRIL 10 MG PO TABS: 10.0000 mg | ORAL_TABLET | Freq: Every day | ORAL | Status: AC

## 2015-09-28 MED ORDER — METOPROLOL TARTRATE 25 MG PO TABS: 12.5000 mg | ORAL_TABLET | Freq: Two times a day (BID) | ORAL | Status: DC

## 2015-09-28 MED ORDER — ATORVASTATIN CALCIUM 80 MG PO TABS: 80.0000 mg | ORAL_TABLET | Freq: Every day | ORAL | Status: AC

## 2015-09-28 MED ORDER — ASPIRIN 325 MG PO TBEC: 325.0000 mg | DELAYED_RELEASE_TABLET | Freq: Every day | ORAL | Status: AC

## 2015-09-28 MED ORDER — HYDROCORTISONE 1 % EX OINT: TOPICAL_OINTMENT | Freq: Three times a day (TID) | CUTANEOUS | Status: DC

## 2015-09-28 NOTE — Progress Notes (Signed)
Patient is agreeable to short term SNF placement.  [facility] is still the only facility in [facility] making a bed offer- facility anticipates having bed available today for patient.  CSW will continue to follow- anticipate DC today to [facility].  Merlyn Lot, LCSWA Clinical Social Worker [phone]

## 2015-09-28 NOTE — Progress Notes (Signed)
Removed 3 CT sutures and applied benzoin and 1/2 " steri strips.  Pt tolerated procedure well. Pt given signs and symptoms of infection.  Removed epicardial wires per order. 3 intact.  Pt tolerated procedure well.  Pt instructed to remain on bedrest for one hour.  Frequent vitals will be taken and documented. Pt resting with call bell within reach. Thomas Hoff, RN

## 2015-09-28 NOTE — Progress Notes (Signed)
Ed completed, comprehension probably poor. Pt easily distracted from education. Sts he is interested in CRPII and agrees to referral being sent to [facility]. Pt began complaining that his clothes were lost here in the hospital. Became frustrated about this. Will send referral to [facility]. Notified RN of missing clothing.  [id number] Ethelda Chick CES, ACSM 1:36 PM 09/28/2015

## 2015-09-28 NOTE — Care Management Note (Signed)
Case Management Note CM note started by Tomi Bamberger RNCM  Patient Details  Name: Ian Morton MRN: [id number] Date of Birth: [birth year]  Subjective/Objective: Pt admitted for Nstemi. Plan for cardiac cath 09-15-15.                    Action/Plan: CM did discuss case with [facility] Liaison Lyn Hollingshead to see if eligible for [facility]. Pt will be eligible for services if needed. CM to monitor for disposition needs.    Expected Discharge Date:     09/28/15             Expected Discharge Plan:  Skilled Nursing Facility  In-House Referral:  Clinical Social Work  Discharge planning Services  CM Consult  Post Acute Care Choice:  NA Choice offered to:     DME Arranged:  N/A DME Agency:  NA  HH Arranged:  NA HH Agency:  NA  Status of Service:  Completed, signed off  Medicare Important Message Given:    Date Medicare IM Given:    Medicare IM give by:    Date Additional Medicare IM Given:    Additional Medicare Important Message give by:     If discussed at Long Length of Stay Meetings, dates discussed:    Additional Comments:  09/28/15- Plan for pt to d/c to SNF today after EPW pulled- CSW following for placement needs.  Darrold Span, RN 09/28/2015, 10:15 AM

## 2015-09-28 NOTE — Progress Notes (Signed)
Utilization review completed.  

## 2015-09-28 NOTE — Progress Notes (Signed)
Patient will discharge to [facility] Anticipated discharge date:09/28/15 Family notified: pt wife Transportation by [employer]- scheduled at 3pm  CSW signing off.  Merlyn Lot, LCSWA Clinical Social Worker [phone]

## 2015-09-28 NOTE — Progress Notes (Addendum)
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 (N/A) TRANSESOPHAGEAL ECHOCARDIOGRAM (TEE) (N/A) Subjective: Feels tired, no other complaints  Objective: Vital signs in last 24 hours: Temp:  [98.1 F (36.7 C)-98.4 F (36.9 C)] 98.4 F (36.9 C) (10/24 0418) Pulse Rate:  [66-72] 67 (10/24 0418) Cardiac Rhythm:  [-] Normal sinus rhythm (10/23 1955) Resp:  [18] 18 (10/24 0418) BP: (113-133)/(59-74) 125/73 mmHg (10/24 0418) SpO2:  [96 %-100 %] 98 % (10/24 0418) Weight:  [192 lb 14.4 oz (87.5 kg)] 192 lb 14.4 oz (87.5 kg) (10/24 0418)  Hemodynamic parameters for last 24 hours:    Intake/Output from previous day: 10/23 0701 - 10/24 0700 In: 779 [P.O.:779] Out: -  Intake/Output this shift:    General appearance: alert, cooperative and no distress Heart: regular rate and rhythm Lungs: clear to auscultation bilaterally Abdomen: benign Extremities: no edema Wound: incis healing well  Lab Results: No results for input(s): WBC, HGB, HCT, PLT in the last 72 hours. BMET:  Recent Labs  09/27/15 0650  NA 135  K 4.2  CL 101  CO2 28  GLUCOSE 124*  BUN 21*  CREATININE 0.89  CALCIUM 8.6*    PT/INR: No results for input(s): LABPROT, INR in the last 72 hours. ABG    Component Value Date/Time   PHART 7.301* 09/19/2015 0152   HCO3 25.0* 09/19/2015 0152   TCO2 23 09/19/2015 1707   ACIDBASEDEF 2.0 09/19/2015 0152   O2SAT 97.0 09/19/2015 0152   CBG (last 3)   Recent Labs  09/27/15 1118 09/27/15 1624 09/27/15 2122  GLUCAP 153* 105* 118*    Meds Scheduled Meds: . aspirin EC  325 mg Oral Daily  . atorvastatin  80 mg Oral q1800  . DULoxetine  60 mg Oral Daily  . enoxaparin (LOVENOX) injection  40 mg Subcutaneous QHS  . fluticasone  2 spray Each Nare Daily  . hydrocortisone   Topical TID  . insulin  aspart  0-24 Units Subcutaneous TID AC & HS  . Linaclotide  145 mcg Oral QAC breakfast  . lisinopril  10 mg Oral Daily  . loratadine  10 mg Oral Daily  . metoprolol tartrate  12.5 mg Oral BID  . mometasone-formoterol  2 puff Inhalation BID  . montelukast  10 mg Oral QHS  . pregabalin  100 mg Oral TID  . sodium chloride  3 mL Intravenous Q12H  . tamsulosin  0.4 mg Oral QHS  . terbinafine  250 mg Oral Daily   Continuous Infusions:  PRN Meds:.sodium chloride, acetaminophen, albuterol, lactulose, sodium chloride  Xrays No results found.  Assessment/Plan: S/P Procedure(s) (LRB): OFF PUMP CORONARY ARTERY BYPASS GRAFTING (CABG)X 1 using left internal mammory artery. (N/A) TRANSESOPHAGEAL ECHOCARDIOGRAM (TEE) (N/A)  1 stable- confused but I think he is closer to baseline 2 d/c epw's 3 SW is assisting with snf placement as wife doesn't want to take home 3 BP control is better, and in sinus rhythm    LOS: 14 days    GOLD,WAYNE E 09/28/2015   Chart reviewed, patient examined, agree with above. He is doing well overall from a medical standpoint and ready for discharge. I think some of his confusion has been related to polypharmacy preop and some related to his communication  barrier.

## 2015-10-14 ENCOUNTER — Encounter: Payer: Medicare Other | Admitting: Surgery

## 2015-10-14 ENCOUNTER — Other Ambulatory Visit: Payer: Self-pay | Admitting: Surgical

## 2015-10-20 ENCOUNTER — Other Ambulatory Visit: Payer: Self-pay | Admitting: Surgery

## 2015-10-20 DIAGNOSIS — Z951 Presence of aortocoronary bypass graft: Secondary | ICD-10-CM

## 2015-10-21 ENCOUNTER — Ambulatory Visit
Admission: RE | Admit: 2015-10-21 | Discharge: 2015-10-21 | Disposition: A | Discharge status: Home / Self Care | Payer: Medicare Other | Source: Ambulatory Visit | Attending: Surgery | Admitting: Surgery

## 2015-10-21 ENCOUNTER — Ambulatory Visit (INDEPENDENT_AMBULATORY_CARE_PROVIDER_SITE_OTHER): Payer: Self-pay | Admitting: Surgery

## 2015-10-21 ENCOUNTER — Encounter: Payer: Self-pay | Admitting: Surgery

## 2015-10-21 VITALS — BP 124/79 | HR 97 | Resp 18 | Ht 69.0 in | Wt 201.0 lb

## 2015-10-21 DIAGNOSIS — I2510 Atherosclerotic heart disease of native coronary artery without angina pectoris: Secondary | ICD-10-CM

## 2015-10-21 DIAGNOSIS — Z951 Presence of aortocoronary bypass graft: Secondary | ICD-10-CM

## 2015-10-21 NOTE — Progress Notes (Signed)
HPI:  Patient returns for routine postoperative follow-up having undergone off pump CABG x 1 with a LIMA to the LAD on 09/18/2015. The patient's early postoperative recovery while in the hospital was notable for development of postop delirium that was severe for a couple days but rapidly resolved. Since hospital discharge the patient reports that he has been slowly improving. He is here with his wife today and they report a history of depression and anxiety that has been followed by a psychiatrist. He feels like it is worse since going home and he is going to see his psychiatrist for follow up. His only somatic complaint is of some burning pain along the incision. He has been walking without chest pain or shortness of breath.   Current Outpatient Prescriptions  Medication Sig Dispense Refill  . ADVAIR HFA 230-21 MCG/ACT inhaler Inhale 2 puffs into the lungs 2 (two) times daily.    Marland Kitchen albuterol (PROAIR HFA) 108 (90 BASE) MCG/ACT inhaler Inhale 2 puffs into the lungs every 6 (six) hours as needed.     Marland Kitchen ammonium lactate (AMLACTIN) 12 % cream APPLY TO AFFECTED AREA AS NEEDED FOR DRY SKIN 140 g 2  . aspirin EC 325 MG EC tablet Take 1 tablet (325 mg total) by mouth daily.    Marland Kitchen atorvastatin (LIPITOR) 80 MG tablet Take 1 tablet (80 mg total) by mouth daily at 6 PM.    . cholecalciferol (VITAMIN D) 1000 UNITS tablet Take 1,000 Units by mouth daily.    . clotrimazole (LOTRIMIN) 1 % cream APPLY TO AFFECTED AREA TWICE DAILY 30 g 2  . DULoxetine (CYMBALTA) 60 MG capsule Take 60 mg by mouth daily.     . fluticasone (FLONASE) 50 MCG/ACT nasal spray Place 2 sprays into both nostrils daily.    . hydrocortisone 1 % ointment APPLY TOPICALLY TO LEFT FOREARM RASH THREE TIMES A DAY 28 g 0  . Linaclotide (LINZESS) 145 MCG CAPS capsule Take 145 mcg by mouth daily.    Marland Kitchen lisinopril (PRINIVIL,ZESTRIL) 10 MG tablet Take 1 tablet (10 mg total) by mouth daily.    Marland Kitchen loratadine (CLARITIN) 10 MG tablet Take 10 mg by mouth  daily.    . metFORMIN (GLUCOPHAGE) 500 MG tablet Take 500 mg by mouth daily with breakfast.    . metoprolol tartrate (LOPRESSOR) 25 MG tablet Take 0.5 tablets (12.5 mg total) by mouth 2 (two) times daily.    . montelukast (SINGULAIR) 10 MG tablet Take 10 mg by mouth at bedtime.    Marland Kitchen omeprazole (PRILOSEC) 20 MG capsule Take 20 mg by mouth daily.    . pregabalin (LYRICA) 100 MG capsule Take 100 mg by mouth 3 (three) times daily.    . tamsulosin (FLOMAX) 0.4 MG CAPS capsule Take 0.4 mg by mouth at bedtime.     Marland Kitchen VITAMIN B COMPLEX-C CAPS Frequency:Daily   Dosage:1     Instructions:  Note:TAKE 1 CAPSULE DAILY.     No current facility-administered medications for this visit.    Physical Exam: BP 124/79 mmHg  Pulse 97  Resp 18  Ht  (1.753 m)  Wt 201 lb (91.173 kg)  BMI 29.67 kg/m2  SpO2 98% He looks well. Lung exam is clear. Cardiac exam shows a regular rate and rhythm with normal heart sounds. Chest incision is healing well and sternum is stable.   Diagnostic Tests:  CLINICAL DATA: CABG 09/18/2015. Shortness of breath. Chest tenderness.  EXAM: CHEST 2 VIEW  COMPARISON: 09/21/2015  FINDINGS: There  is no focal parenchymal opacity. There is no pleural effusion or pneumothorax. The heart and mediastinal contours are unremarkable. There is evidence of prior CABG.  The osseous structures are unremarkable.  IMPRESSION: No active cardiopulmonary disease.   Electronically Signed  By: Elige Ko  On: 10/21/2015 10:04   Impression:  Overall I think he is doing well. I encouraged him to continue walking and to participate in cardiac rehab. I told him he could drive his car but should not lift anything heavier than 10 lbs for three months postop. He will follow up with his psychiatrist concerning his anxiety and depression.    Plan:  He has a follow up visit with Dr. Sherril Croon later today.   Alleen Borne, MD [facility] [phone]

## 2015-10-22 ENCOUNTER — Encounter: Payer: Self-pay | Admitting: *Deleted

## 2015-11-10 NOTE — Progress Notes (Addendum)
Late Entry Addendum to the Initial Evaluation    09/23/15 1510  PT Time Calculation  PT Start Time (ACUTE ONLY) 1442  PT Stop Time (ACUTE ONLY) 1508  PT Time Calculation (min) (ACUTE ONLY) 26 min  PT G-Codes **NOT FOR INPATIENT CLASS**  Functional Assessment Tool Used clinical judgement  Functional Limitation Mobility: Walking and moving around  Mobility: Walking and Moving Around Current Status (F7588) CI  Mobility: Walking and Moving Around Goal Status (T2549) CI  PT General Charges  $$ ACUTE PT VISIT 1 Procedure  PT Evaluation  $Initial PT Evaluation Tier I 1 Procedure  PT Treatments  $Gait Training 8-22 mins   11/10/2015  [place] Bing, PT [phone]  (pager)

## 2015-12-25 ENCOUNTER — Other Ambulatory Visit: Payer: Self-pay

## 2015-12-25 MED ORDER — ADVAIR HFA 230-21 MCG/ACT IN AERO: 2.0000 | INHALATION_SPRAY | Freq: Two times a day (BID) | RESPIRATORY_TRACT | Status: DC

## 2016-02-29 ENCOUNTER — Institutional Professional Consult (permissible substitution): Payer: Medicare Other | Admitting: Pulmonary Disease

## 2016-03-02 ENCOUNTER — Other Ambulatory Visit: Payer: Self-pay | Admitting: Surgical

## 2016-08-26 IMAGING — CR DG CHEST 1V PORT
1 series · 1 of 1 positions shown · non-contrast
Comparison: 09/14/2015

CLINICAL DATA: Postop from coronary bypass grafting. Previous
myocardial infarct.

EXAM:
PORTABLE CHEST 1 VIEW

[AP]
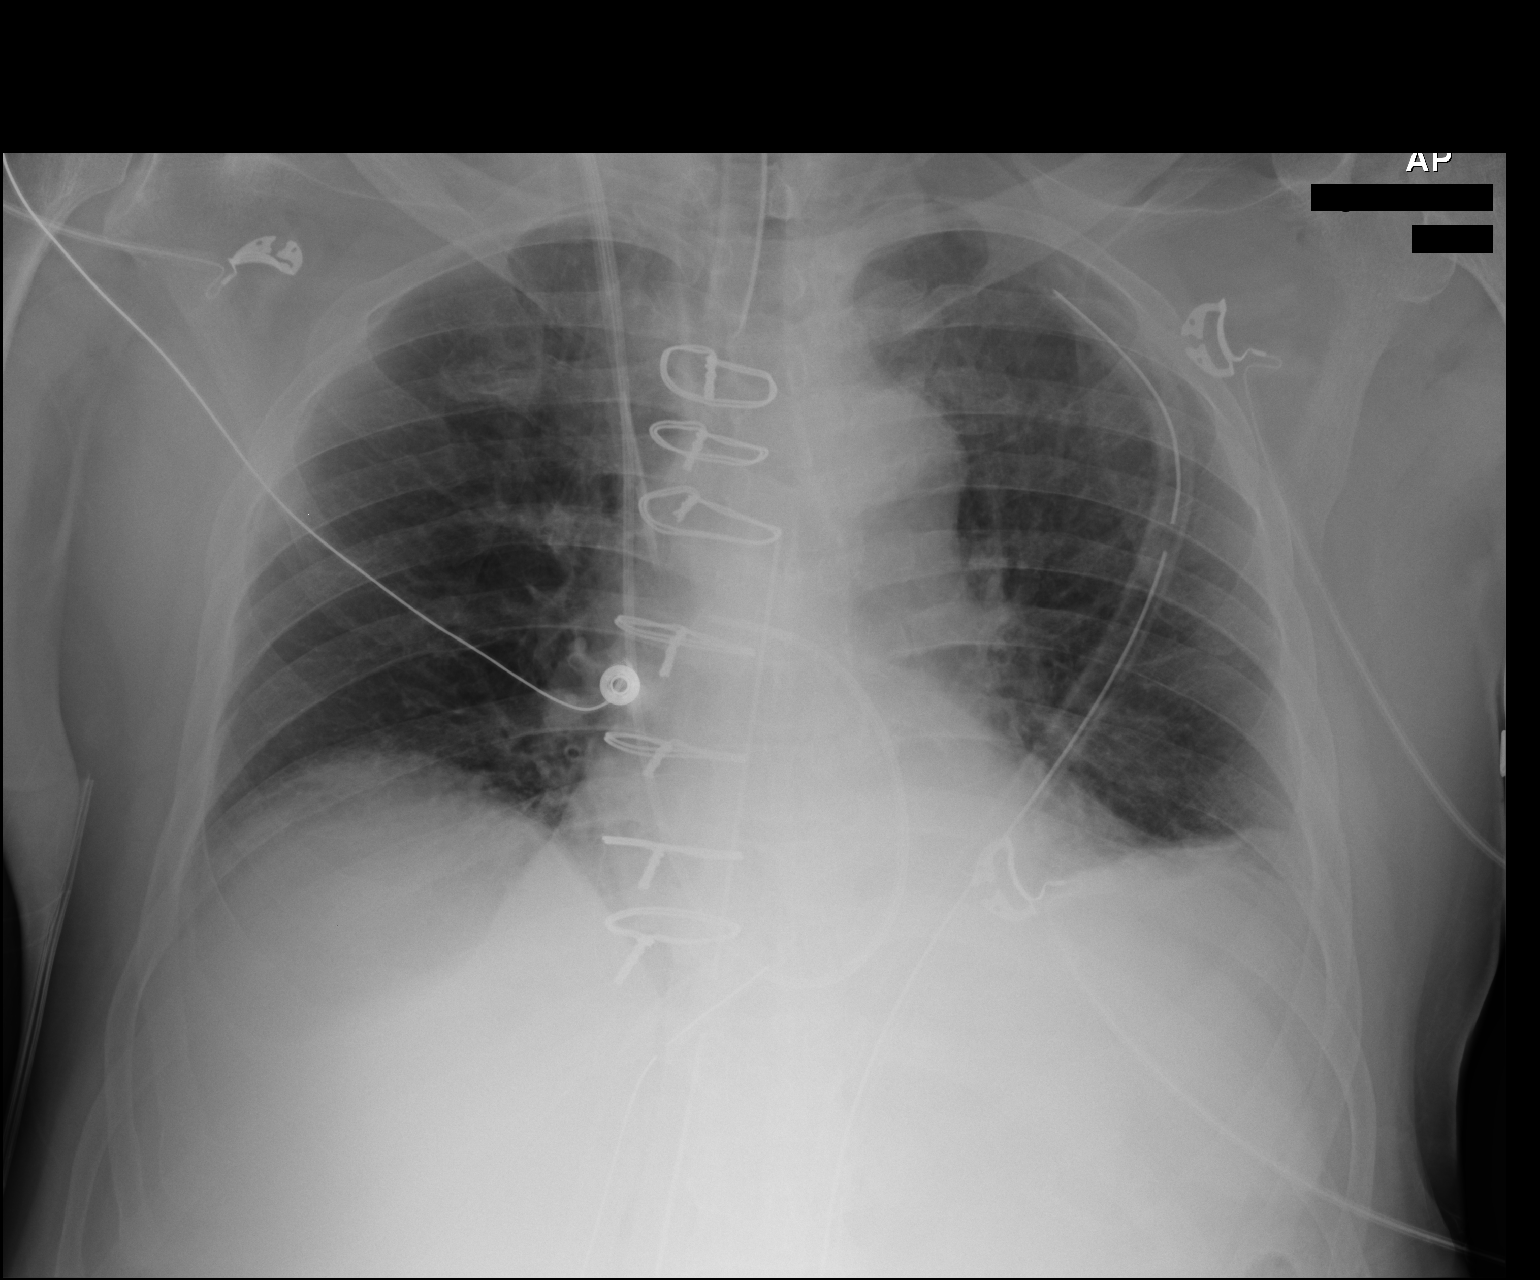

[1 of 1 positions shown; findings below may reference images not displayed]

FINDINGS: Patient has undergone CABG since previous study. Endotracheal tube,
Swan-Ganz catheter, left jugular central venous catheter, left chest
tube, and mediastinal drain are seen in expected position.

No pneumothorax visualized. Low lung volumes are seen with mild
bibasilar atelectasis. Heart size remains stable.
IMPRESSION: Postop chest. Mild bibasilar atelectasis. No pneumothorax
visualized.

## 2016-08-27 IMAGING — CR DG CHEST 1V PORT
1 series · 1 of 1 positions shown · non-contrast
Comparison: Chest x-ray 09/18/2015.

CLINICAL DATA: 64-year-old male status post CABG.

EXAM:
PORTABLE CHEST 1 VIEW

[AP]
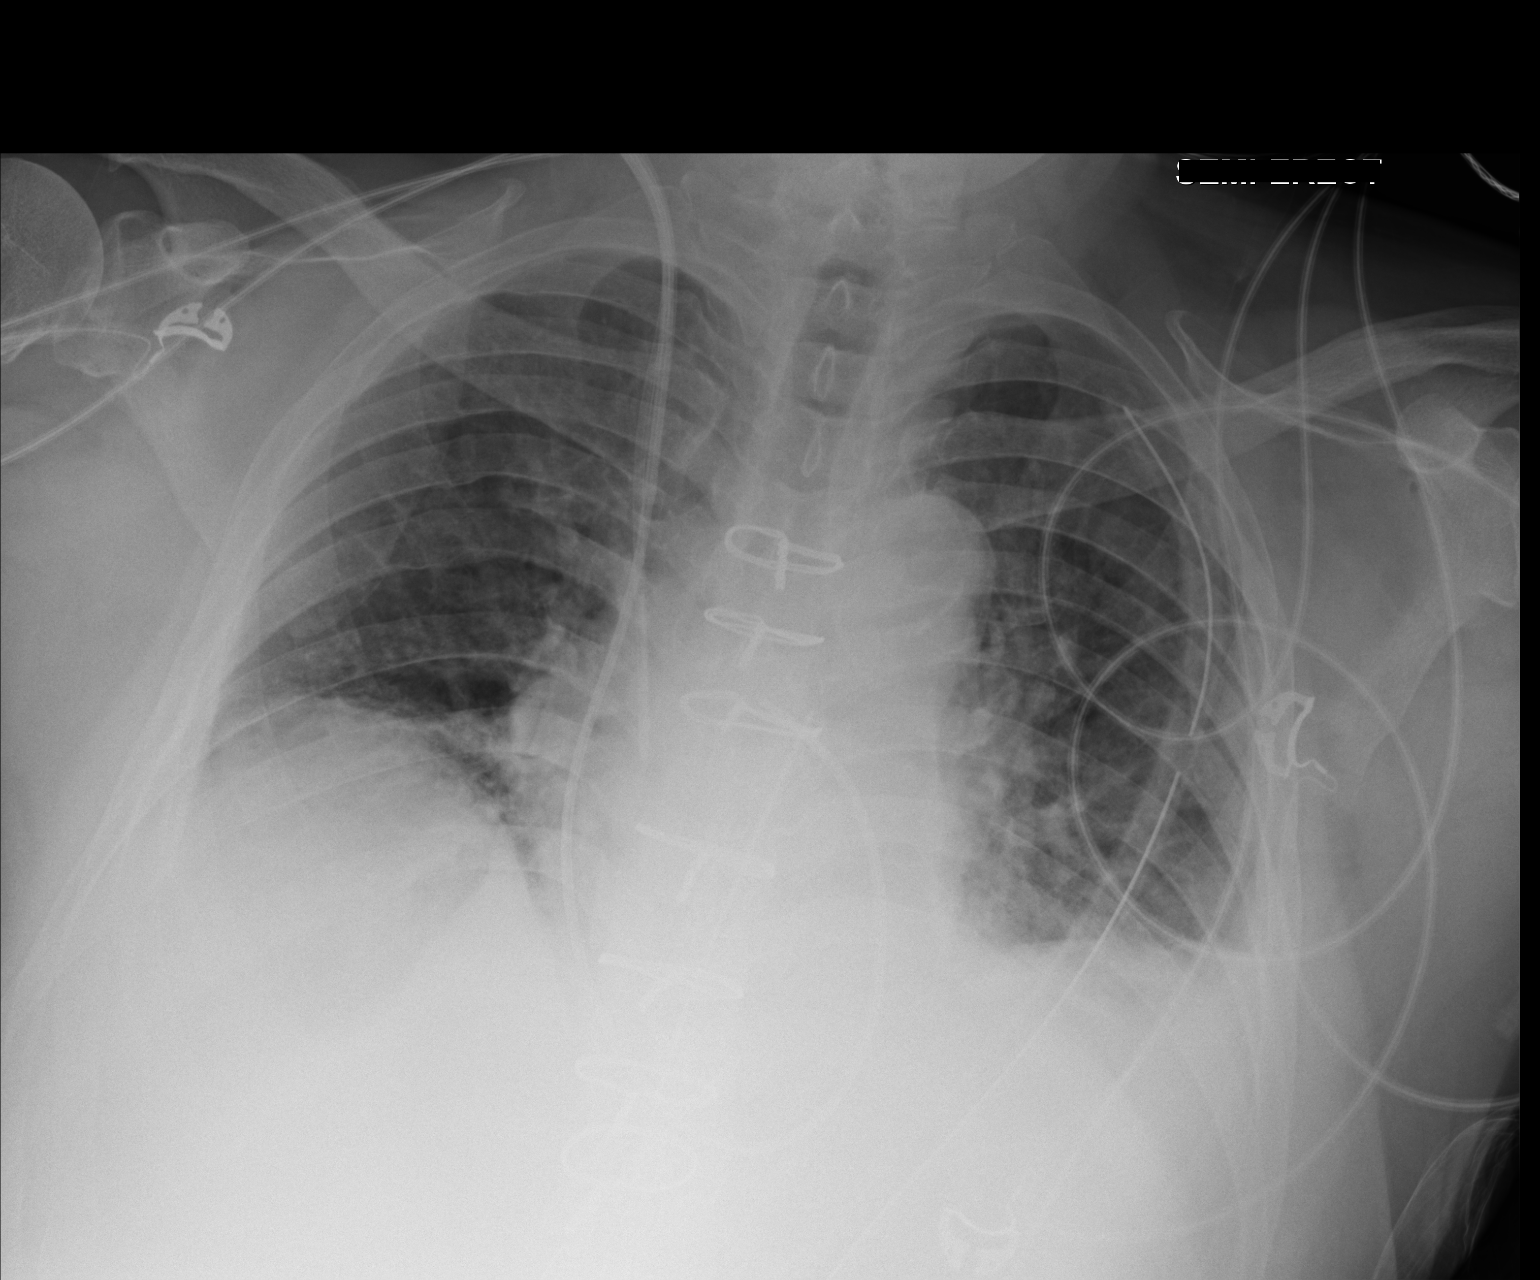

[1 of 1 positions shown; findings below may reference images not displayed]

FINDINGS: Patient has been extubated. Right IJ catheter remains in position
with tip at the superior cavoatrial junction. Right IJ Cordis
through which a Swan-Ganz catheter has been passed into the proximal
right main pulmonary artery. Midline drain, presumably mediastinal/
pericardial. Left-sided chest tube with tip directed to the apex of
the left hemithorax. Lung volumes are low. No appreciable
pneumothorax. Bibasilar opacities presumably reflect areas of
postoperative subsegmental atelectasis. No definite acute
consolidative airspace disease. Small bilateral pleural effusions
(left greater than right). Vascular crowding related to low lung
volumes, without frank pulmonary edema. Cardiopericardial silhouette
is largely obscured by patient positioning. Upper mediastinal
contours are within normal limits. Atherosclerosis in the thoracic
aorta. Status post median sternotomy.
IMPRESSION: 1. Postoperative changes and support apparatus, as above.
2. Low lung volumes with probable bibasilar subsegmental
atelectasis.

## 2016-08-29 IMAGING — CR DG CHEST 1V PORT
1 series · 1 of 1 positions shown · non-contrast
Comparison: Chest radiograph from one day prior.

CLINICAL DATA: Status post CABG

EXAM:
PORTABLE CHEST 1 VIEW

[AP]
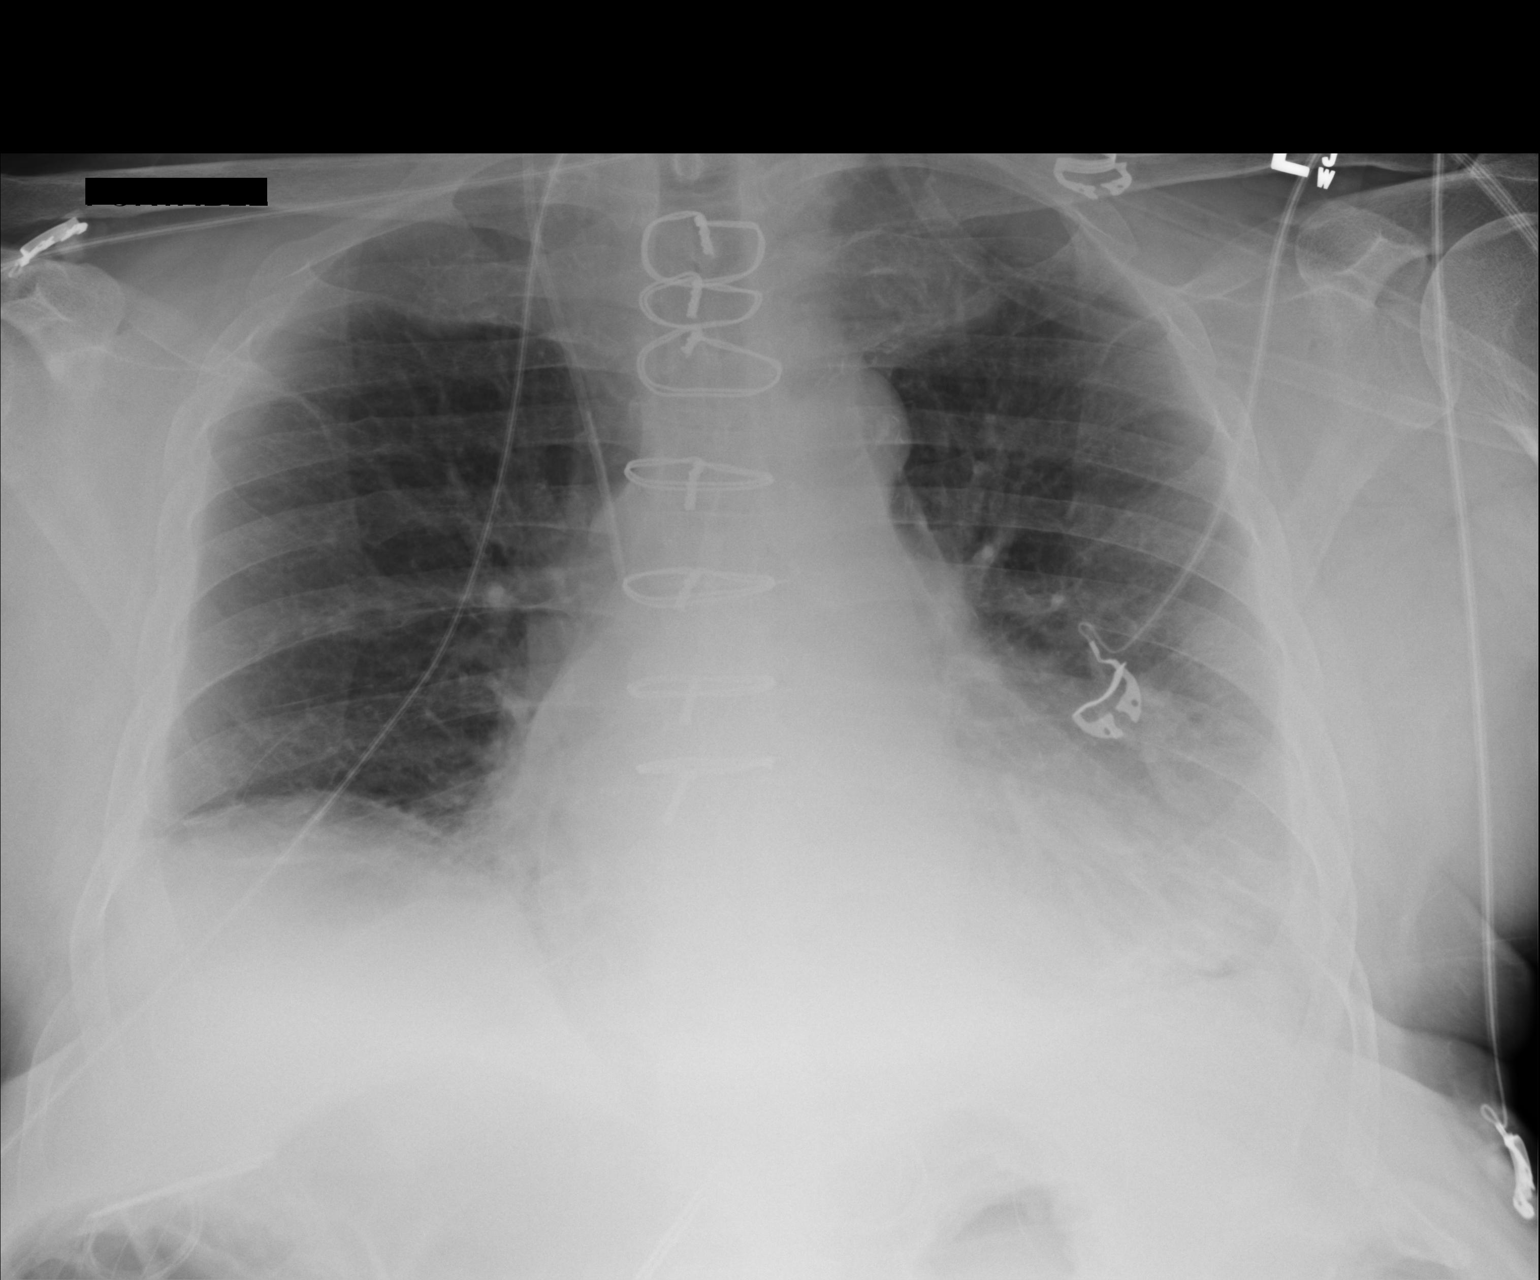

[1 of 1 positions shown; findings below may reference images not displayed]

FINDINGS: Right internal jugular central venous catheter terminates in the
lower third of the superior vena cava. Aligned and intact median
sternotomy wires. Stable cardiomediastinal silhouette with mild
cardiomegaly. No pneumothorax. Stable small left pleural effusion.
Stable mild-to-moderate bibasilar atelectasis. No pulmonary edema.
IMPRESSION: 1. No pneumothorax.
2. Stable small left pleural effusion.
3. Stable mild cardiomegaly without pulmonary edema.
4. Stable mild-to-moderate bibasilar atelectasis.

## 2016-11-22 ENCOUNTER — Other Ambulatory Visit: Payer: Self-pay | Admitting: Allergy and Immunology

## 2018-04-17 ENCOUNTER — Emergency Department (HOSPITAL_COMMUNITY): Payer: Medicare Other

## 2018-04-17 ENCOUNTER — Encounter (HOSPITAL_COMMUNITY): Payer: Self-pay

## 2018-04-17 ENCOUNTER — Emergency Department (HOSPITAL_COMMUNITY)
Admission: EM | Admit: 2018-04-17 | Discharge: 2018-04-17 | Disposition: A | Discharge status: Home / Self Care | Payer: Medicare Other | Attending: Emergency Medicine | Admitting: Emergency Medicine

## 2018-04-17 ENCOUNTER — Other Ambulatory Visit: Payer: Self-pay

## 2018-04-17 DIAGNOSIS — Z951 Presence of aortocoronary bypass graft: Secondary | ICD-10-CM | POA: Insufficient documentation

## 2018-04-17 DIAGNOSIS — Z7984 Long term (current) use of oral hypoglycemic drugs: Secondary | ICD-10-CM | POA: Diagnosis not present

## 2018-04-17 DIAGNOSIS — R0789 Other chest pain: Secondary | ICD-10-CM | POA: Diagnosis present

## 2018-04-17 DIAGNOSIS — M545 Low back pain: Secondary | ICD-10-CM | POA: Diagnosis not present

## 2018-04-17 DIAGNOSIS — R109 Unspecified abdominal pain: Secondary | ICD-10-CM | POA: Diagnosis not present

## 2018-04-17 DIAGNOSIS — I2510 Atherosclerotic heart disease of native coronary artery without angina pectoris: Secondary | ICD-10-CM | POA: Diagnosis not present

## 2018-04-17 DIAGNOSIS — Z7902 Long term (current) use of antithrombotics/antiplatelets: Secondary | ICD-10-CM | POA: Diagnosis not present

## 2018-04-17 DIAGNOSIS — I252 Old myocardial infarction: Secondary | ICD-10-CM | POA: Diagnosis not present

## 2018-04-17 DIAGNOSIS — R079 Chest pain, unspecified: Secondary | ICD-10-CM

## 2018-04-17 DIAGNOSIS — Z7982 Long term (current) use of aspirin: Secondary | ICD-10-CM | POA: Diagnosis not present

## 2018-04-17 DIAGNOSIS — Z87891 Personal history of nicotine dependence: Secondary | ICD-10-CM | POA: Diagnosis not present

## 2018-04-17 DIAGNOSIS — R110 Nausea: Secondary | ICD-10-CM | POA: Insufficient documentation

## 2018-04-17 DIAGNOSIS — R0602 Shortness of breath: Secondary | ICD-10-CM | POA: Diagnosis not present

## 2018-04-17 DIAGNOSIS — I10 Essential (primary) hypertension: Secondary | ICD-10-CM | POA: Insufficient documentation

## 2018-04-17 DIAGNOSIS — E119 Type 2 diabetes mellitus without complications: Secondary | ICD-10-CM | POA: Diagnosis not present

## 2018-04-17 LAB — I-STAT TROPONIN, ED
Troponin i, poc: 0 ng/mL (ref 0.00–0.08)
Troponin i, poc: 0 ng/mL (ref 0.00–0.08)

## 2018-04-17 LAB — URINALYSIS, ROUTINE W REFLEX MICROSCOPIC
Bilirubin Urine: NEGATIVE
GLUCOSE, UA: NEGATIVE mg/dL
Hgb urine dipstick: NEGATIVE
Ketones, ur: NEGATIVE mg/dL
LEUKOCYTES UA: NEGATIVE
Nitrite: NEGATIVE
PROTEIN: NEGATIVE mg/dL
SPECIFIC GRAVITY, URINE: 1.008 (ref 1.005–1.030)
pH: 8 (ref 5.0–8.0)

## 2018-04-17 LAB — CBC
HEMATOCRIT: 45.8 % (ref 39.0–52.0)
Hemoglobin: 14.9 g/dL (ref 13.0–17.0)
MCH: 28 pg (ref 26.0–34.0)
MCHC: 32.5 g/dL (ref 30.0–36.0)
MCV: 86.1 fL (ref 78.0–100.0)
PLATELETS: 192 10*3/uL (ref 150–400)
RBC: 5.32 MIL/uL (ref 4.22–5.81)
RDW: 13.6 % (ref 11.5–15.5)
WBC: 8.3 10*3/uL (ref 4.0–10.5)

## 2018-04-17 LAB — BASIC METABOLIC PANEL
Anion gap: 8 (ref 5–15)
BUN: 13 mg/dL (ref 6–20)
CHLORIDE: 105 mmol/L (ref 101–111)
CO2: 25 mmol/L (ref 22–32)
CREATININE: 0.9 mg/dL (ref 0.61–1.24)
Calcium: 9 mg/dL (ref 8.9–10.3)
GFR calc Af Amer: >60 mL/min (ref >60)
GFR calc non Af Amer: >60 mL/min (ref >60)
GLUCOSE: 115 mg/dL — AB (ref 65–99)
POTASSIUM: 5.2 mmol/L — AB (ref 3.5–5.1)
Sodium: 138 mmol/L (ref 135–145)

## 2018-04-17 LAB — I-STAT CG4 LACTIC ACID, ED: Lactic Acid, Venous: 1.06 mmol/L (ref 0.5–1.9)

## 2018-04-17 NOTE — Progress Notes (Signed)
CSW and CM met with pt at bedside. Pt was sitting up in the bed speaking with wife on the phone. CM spoke with pt's wife about home health care. CSW provided taxi voucher for pt to return. Pt's wife is unable to pick up pt due to her own medical reasons.   Wendelyn Breslow, [facility]  [phone]
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
## 2018-04-17 NOTE — ED Notes (Signed)
Case management advised that patient is cleared to sit in lobby.

## 2018-04-17 NOTE — ED Notes (Signed)
Pt transported to XR.  

## 2018-04-17 NOTE — ED Notes (Signed)
Case management at bedside.

## 2018-04-17 NOTE — ED Triage Notes (Signed)
Pt arrived via [facility] from home c/o SOB x 2 days.  Treated for PNA 2 weeks ago and completed antibiotics 6 days ago.  98% on RA.

## 2018-04-17 NOTE — ED Notes (Signed)
Pt given [place] sandwich and iced water prior to d/c.

## 2018-04-17 NOTE — ED Notes (Signed)
Pt stable, ambulatory, states understanding of discharge instructions 

## 2018-04-17 NOTE — ED Notes (Signed)
Pt. returned from XR. 

## 2018-04-21 ENCOUNTER — Emergency Department (HOSPITAL_BASED_OUTPATIENT_CLINIC_OR_DEPARTMENT_OTHER): Payer: Medicare Other

## 2018-04-21 ENCOUNTER — Emergency Department (HOSPITAL_BASED_OUTPATIENT_CLINIC_OR_DEPARTMENT_OTHER)
Admission: EM | Admit: 2018-04-21 | Discharge: 2018-04-21 | Disposition: A | Discharge status: Home / Self Care | Payer: Medicare Other | Attending: Emergency Medicine | Admitting: Emergency Medicine

## 2018-04-21 ENCOUNTER — Encounter (HOSPITAL_BASED_OUTPATIENT_CLINIC_OR_DEPARTMENT_OTHER): Payer: Self-pay | Admitting: Emergency Medicine

## 2018-04-21 ENCOUNTER — Other Ambulatory Visit: Payer: Self-pay

## 2018-04-21 DIAGNOSIS — Z951 Presence of aortocoronary bypass graft: Secondary | ICD-10-CM | POA: Insufficient documentation

## 2018-04-21 DIAGNOSIS — I2510 Atherosclerotic heart disease of native coronary artery without angina pectoris: Secondary | ICD-10-CM | POA: Diagnosis not present

## 2018-04-21 DIAGNOSIS — I252 Old myocardial infarction: Secondary | ICD-10-CM | POA: Insufficient documentation

## 2018-04-21 DIAGNOSIS — E119 Type 2 diabetes mellitus without complications: Secondary | ICD-10-CM | POA: Insufficient documentation

## 2018-04-21 DIAGNOSIS — Z7902 Long term (current) use of antithrombotics/antiplatelets: Secondary | ICD-10-CM | POA: Diagnosis not present

## 2018-04-21 DIAGNOSIS — Z87891 Personal history of nicotine dependence: Secondary | ICD-10-CM | POA: Diagnosis not present

## 2018-04-21 DIAGNOSIS — Z7982 Long term (current) use of aspirin: Secondary | ICD-10-CM | POA: Diagnosis not present

## 2018-04-21 DIAGNOSIS — Z79899 Other long term (current) drug therapy: Secondary | ICD-10-CM | POA: Diagnosis not present

## 2018-04-21 DIAGNOSIS — R1084 Generalized abdominal pain: Secondary | ICD-10-CM | POA: Diagnosis not present

## 2018-04-21 DIAGNOSIS — I10 Essential (primary) hypertension: Secondary | ICD-10-CM | POA: Diagnosis not present

## 2018-04-21 LAB — CBC WITH DIFFERENTIAL/PLATELET
BASOS ABS: 0 10*3/uL (ref 0.0–0.1)
Basophils Relative: 0 %
EOS ABS: 0.3 10*3/uL (ref 0.0–0.7)
Eosinophils Relative: 3 %
HCT: 44.7 % (ref 39.0–52.0)
Hemoglobin: 15.4 g/dL (ref 13.0–17.0)
LYMPHS ABS: 2.4 10*3/uL (ref 0.7–4.0)
Lymphocytes Relative: 25 %
MCH: 29.3 pg (ref 26.0–34.0)
MCHC: 34.5 g/dL (ref 30.0–36.0)
MCV: 85.1 fL (ref 78.0–100.0)
Monocytes Absolute: 0.8 10*3/uL (ref 0.1–1.0)
Monocytes Relative: 9 %
Neutro Abs: 6 10*3/uL (ref 1.7–7.7)
Neutrophils Relative %: 63 %
Platelets: 173 10*3/uL (ref 150–400)
RBC: 5.25 MIL/uL (ref 4.22–5.81)
RDW: 13.7 % (ref 11.5–15.5)
WBC: 9.5 10*3/uL (ref 4.0–10.5)

## 2018-04-21 LAB — COMPREHENSIVE METABOLIC PANEL
ALK PHOS: 76 U/L (ref 38–126)
ALT: 28 U/L (ref 17–63)
ANION GAP: 7 (ref 5–15)
AST: 29 U/L (ref 15–41)
Albumin: 3.7 g/dL (ref 3.5–5.0)
BUN: 19 mg/dL (ref 6–20)
CHLORIDE: 103 mmol/L (ref 101–111)
CO2: 26 mmol/L (ref 22–32)
Calcium: 9.1 mg/dL (ref 8.9–10.3)
Creatinine, Ser: 0.96 mg/dL (ref 0.61–1.24)
Glucose, Bld: 91 mg/dL (ref 65–99)
Potassium: 5.2 mmol/L — ABNORMAL HIGH (ref 3.5–5.1)
SODIUM: 136 mmol/L (ref 135–145)
TOTAL PROTEIN: 6.4 g/dL — AB (ref 6.5–8.1)
Total Bilirubin: 0.4 mg/dL (ref 0.3–1.2)

## 2018-04-21 LAB — LIPASE, BLOOD: LIPASE: 31 U/L (ref 11–51)

## 2018-04-21 MED ORDER — ALUM & MAG HYDROXIDE-SIMETH 200-200-20 MG/5ML PO SUSP
30.0000 mL | Freq: Once | ORAL | Status: AC
  Administered 2018-04-21: 30 mL via ORAL
  Filled 2018-04-21: qty 30

## 2018-04-21 MED ORDER — PRAMOXINE HCL 1 % RE FOAM: 1.0000 "application " | Freq: Three times a day (TID) | RECTAL | 0 refills | Status: AC | PRN

## 2018-04-21 NOTE — ED Notes (Signed)
Pt reports sometimes constipation, sometimes diarrhea. Last BM was today and normal. Pt reports a "great deal of pain".
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
## 2018-04-21 NOTE — ED Notes (Signed)
Patient transported to X-ray 

## 2018-04-21 NOTE — ED Notes (Signed)
Pt refused chest x-ray and blood work at this time. EDP notified.

## 2018-04-21 NOTE — ED Notes (Signed)
ED Provider at bedside. 

## 2018-04-21 NOTE — ED Triage Notes (Signed)
Abd pain "all over" for 4 weeks. States he has a rash. States "I think I have herpes".

## 2018-04-21 NOTE — ED Notes (Addendum)
Pt speaking in short sentences and has labored breathing. SpO2 100% at this time. Pt seems anxious. EDP notified.

## 2019-08-28 ENCOUNTER — Other Ambulatory Visit: Payer: Self-pay

## 2019-08-28 ENCOUNTER — Inpatient Hospital Stay (HOSPITAL_COMMUNITY)
Admission: EM | Admit: 2019-08-28 | Discharge: 2019-09-02 | DRG: 177 | Disposition: A | Discharge status: Home Health | Payer: Medicare HMO | Attending: Internal Medicine | Admitting: Internal Medicine

## 2019-08-28 ENCOUNTER — Encounter (HOSPITAL_COMMUNITY): Payer: Self-pay | Admitting: Emergency Medicine

## 2019-08-28 ENCOUNTER — Emergency Department (HOSPITAL_COMMUNITY): Payer: Medicare HMO

## 2019-08-28 ENCOUNTER — Observation Stay (HOSPITAL_COMMUNITY): Payer: Medicare HMO

## 2019-08-28 DIAGNOSIS — Z955 Presence of coronary angioplasty implant and graft: Secondary | ICD-10-CM

## 2019-08-28 DIAGNOSIS — J9601 Acute respiratory failure with hypoxia: Secondary | ICD-10-CM | POA: Diagnosis present

## 2019-08-28 DIAGNOSIS — K219 Gastro-esophageal reflux disease without esophagitis: Secondary | ICD-10-CM | POA: Diagnosis present

## 2019-08-28 DIAGNOSIS — E119 Type 2 diabetes mellitus without complications: Secondary | ICD-10-CM

## 2019-08-28 DIAGNOSIS — Z951 Presence of aortocoronary bypass graft: Secondary | ICD-10-CM

## 2019-08-28 DIAGNOSIS — J129 Viral pneumonia, unspecified: Secondary | ICD-10-CM

## 2019-08-28 DIAGNOSIS — I2510 Atherosclerotic heart disease of native coronary artery without angina pectoris: Secondary | ICD-10-CM | POA: Diagnosis present

## 2019-08-28 DIAGNOSIS — Z87891 Personal history of nicotine dependence: Secondary | ICD-10-CM

## 2019-08-28 DIAGNOSIS — Z7984 Long term (current) use of oral hypoglycemic drugs: Secondary | ICD-10-CM

## 2019-08-28 DIAGNOSIS — U071 COVID-19: Principal | ICD-10-CM | POA: Diagnosis present

## 2019-08-28 DIAGNOSIS — J449 Chronic obstructive pulmonary disease, unspecified: Secondary | ICD-10-CM | POA: Diagnosis not present

## 2019-08-28 DIAGNOSIS — G9341 Metabolic encephalopathy: Secondary | ICD-10-CM | POA: Diagnosis present

## 2019-08-28 DIAGNOSIS — R0603 Acute respiratory distress: Secondary | ICD-10-CM | POA: Diagnosis not present

## 2019-08-28 DIAGNOSIS — R0602 Shortness of breath: Secondary | ICD-10-CM | POA: Diagnosis not present

## 2019-08-28 DIAGNOSIS — J1289 Other viral pneumonia: Secondary | ICD-10-CM | POA: Diagnosis present

## 2019-08-28 DIAGNOSIS — Z7951 Long term (current) use of inhaled steroids: Secondary | ICD-10-CM

## 2019-08-28 DIAGNOSIS — R6510 Systemic inflammatory response syndrome (SIRS) of non-infectious origin without acute organ dysfunction: Secondary | ICD-10-CM | POA: Diagnosis present

## 2019-08-28 DIAGNOSIS — Z7902 Long term (current) use of antithrombotics/antiplatelets: Secondary | ICD-10-CM

## 2019-08-28 DIAGNOSIS — E875 Hyperkalemia: Secondary | ICD-10-CM | POA: Diagnosis not present

## 2019-08-28 DIAGNOSIS — E785 Hyperlipidemia, unspecified: Secondary | ICD-10-CM | POA: Diagnosis present

## 2019-08-28 DIAGNOSIS — J440 Chronic obstructive pulmonary disease with acute lower respiratory infection: Secondary | ICD-10-CM | POA: Diagnosis present

## 2019-08-28 DIAGNOSIS — E11649 Type 2 diabetes mellitus with hypoglycemia without coma: Secondary | ICD-10-CM | POA: Diagnosis not present

## 2019-08-28 DIAGNOSIS — I10 Essential (primary) hypertension: Secondary | ICD-10-CM | POA: Diagnosis present

## 2019-08-28 DIAGNOSIS — Z7982 Long term (current) use of aspirin: Secondary | ICD-10-CM

## 2019-08-28 DIAGNOSIS — I252 Old myocardial infarction: Secondary | ICD-10-CM

## 2019-08-28 LAB — CBC WITH DIFFERENTIAL/PLATELET
Abs Immature Granulocytes: 0.04 10*3/uL (ref 0.00–0.07)
Basophils Absolute: 0 10*3/uL (ref 0.0–0.1)
Basophils Relative: 0 %
Eosinophils Absolute: 0.1 10*3/uL (ref 0.0–0.5)
Eosinophils Relative: 1 %
HCT: 41.9 % (ref 39.0–52.0)
Hemoglobin: 14.1 g/dL (ref 13.0–17.0)
Immature Granulocytes: 0 %
Lymphocytes Relative: 5 %
Lymphs Abs: 0.4 10*3/uL — ABNORMAL LOW (ref 0.7–4.0)
MCH: 30.1 pg (ref 26.0–34.0)
MCHC: 33.7 g/dL (ref 30.0–36.0)
MCV: 89.5 fL (ref 80.0–100.0)
Monocytes Absolute: 0.8 10*3/uL (ref 0.1–1.0)
Monocytes Relative: 9 %
Neutro Abs: 7.6 10*3/uL (ref 1.7–7.7)
Neutrophils Relative %: 85 %
Platelets: 180 10*3/uL (ref 150–400)
RBC: 4.68 MIL/uL (ref 4.22–5.81)
RDW: 13.2 % (ref 11.5–15.5)
WBC: 9 10*3/uL (ref 4.0–10.5)
nRBC: 0 % (ref 0.0–0.2)

## 2019-08-28 LAB — TROPONIN I (HIGH SENSITIVITY): Troponin I (High Sensitivity): 5 ng/L (ref <18)

## 2019-08-28 LAB — BRAIN NATRIURETIC PEPTIDE: B Natriuretic Peptide: 20.6 pg/mL (ref 0.0–100.0)

## 2019-08-28 LAB — BASIC METABOLIC PANEL
Anion gap: 10 (ref 5–15)
BUN: 16 mg/dL (ref 8–23)
CO2: 23 mmol/L (ref 22–32)
Calcium: 8.8 mg/dL — ABNORMAL LOW (ref 8.9–10.3)
Chloride: 102 mmol/L (ref 98–111)
Creatinine, Ser: 1 mg/dL (ref 0.61–1.24)
GFR calc Af Amer: >60 mL/min (ref >60)
GFR calc non Af Amer: >60 mL/min (ref >60)
Glucose, Bld: 109 mg/dL — ABNORMAL HIGH (ref 70–99)
Potassium: 3.9 mmol/L (ref 3.5–5.1)
Sodium: 135 mmol/L (ref 135–145)

## 2019-08-28 LAB — SARS CORONAVIRUS 2 BY RT PCR (HOSPITAL ORDER, PERFORMED IN ~~LOC~~ HOSPITAL LAB): SARS Coronavirus 2: POSITIVE — AB

## 2019-08-28 MED ORDER — POLYETHYLENE GLYCOL 3350 17 G PO PACK: 17.0000 g | PACK | Freq: Every day | ORAL | Status: DC | PRN

## 2019-08-28 MED ORDER — SODIUM CHLORIDE 0.9% FLUSH
3.0000 mL | Freq: Two times a day (BID) | INTRAVENOUS | Status: DC
  Administered 2019-08-29 – 2019-09-02 (×6): 3 mL via INTRAVENOUS

## 2019-08-28 MED ORDER — ONDANSETRON HCL 4 MG PO TABS: 4.0000 mg | ORAL_TABLET | Freq: Four times a day (QID) | ORAL | Status: DC | PRN

## 2019-08-28 MED ORDER — ASPIRIN 325 MG PO TABS
325.0000 mg | ORAL_TABLET | Freq: Every day | ORAL | Status: DC
  Administered 2019-08-29 – 2019-09-02 (×5): 325 mg via ORAL
  Filled 2019-08-28 (×6): qty 1

## 2019-08-28 MED ORDER — MOMETASONE FURO-FORMOTEROL FUM 200-5 MCG/ACT IN AERO
2.0000 | INHALATION_SPRAY | Freq: Two times a day (BID) | RESPIRATORY_TRACT | Status: DC
  Administered 2019-08-29 – 2019-09-02 (×9): 2 via RESPIRATORY_TRACT
  Filled 2019-08-28: qty 8.8

## 2019-08-28 MED ORDER — HYDROCODONE-ACETAMINOPHEN 5-325 MG PO TABS
1.0000 | ORAL_TABLET | ORAL | Status: DC | PRN
  Administered 2019-08-29: 1 via ORAL
  Filled 2019-08-28: qty 1

## 2019-08-28 MED ORDER — ENOXAPARIN SODIUM 40 MG/0.4ML ~~LOC~~ SOLN
40.0000 mg | SUBCUTANEOUS | Status: DC
  Administered 2019-08-29 – 2019-09-02 (×5): 40 mg via SUBCUTANEOUS
  Filled 2019-08-28 (×5): qty 0.4

## 2019-08-28 MED ORDER — ACETAMINOPHEN 325 MG PO TABS
650.0000 mg | ORAL_TABLET | Freq: Four times a day (QID) | ORAL | Status: DC | PRN
  Filled 2019-08-28: qty 2

## 2019-08-28 MED ORDER — SODIUM CHLORIDE 0.9% FLUSH: 3.0000 mL | INTRAVENOUS | Status: DC | PRN

## 2019-08-28 MED ORDER — METOPROLOL SUCCINATE ER 25 MG PO TB24
25.0000 mg | ORAL_TABLET | Freq: Every day | ORAL | Status: DC
  Administered 2019-08-29 – 2019-09-02 (×5): 25 mg via ORAL
  Filled 2019-08-28 (×6): qty 1

## 2019-08-28 MED ORDER — INSULIN ASPART 100 UNIT/ML ~~LOC~~ SOLN
0.0000 [IU] | Freq: Every day | SUBCUTANEOUS | Status: DC
  Administered 2019-08-29 – 2019-08-31 (×3): 2 [IU] via SUBCUTANEOUS
  Administered 2019-09-01: 5 [IU] via SUBCUTANEOUS

## 2019-08-28 MED ORDER — SODIUM CHLORIDE 0.9 % IV BOLUS
1000.0000 mL | Freq: Once | INTRAVENOUS | Status: AC
  Administered 2019-08-28: 1000 mL via INTRAVENOUS

## 2019-08-28 MED ORDER — ONDANSETRON HCL 4 MG/2ML IJ SOLN
4.0000 mg | Freq: Once | INTRAMUSCULAR | Status: AC
  Administered 2019-08-28: 4 mg via INTRAVENOUS
  Filled 2019-08-28: qty 2

## 2019-08-28 MED ORDER — LISINOPRIL 10 MG PO TABS
10.0000 mg | ORAL_TABLET | Freq: Every day | ORAL | Status: DC
  Administered 2019-08-29 – 2019-09-02 (×5): 10 mg via ORAL
  Filled 2019-08-28 (×5): qty 1

## 2019-08-28 MED ORDER — ALBUTEROL SULFATE HFA 108 (90 BASE) MCG/ACT IN AERS
2.0000 | INHALATION_SPRAY | RESPIRATORY_TRACT | Status: DC | PRN
  Filled 2019-08-28: qty 6.7

## 2019-08-28 MED ORDER — INSULIN ASPART 100 UNIT/ML ~~LOC~~ SOLN
0.0000 [IU] | Freq: Three times a day (TID) | SUBCUTANEOUS | Status: DC
  Administered 2019-08-29: 1 [IU] via SUBCUTANEOUS
  Administered 2019-08-30: 3 [IU] via SUBCUTANEOUS
  Administered 2019-08-30: 2 [IU] via SUBCUTANEOUS
  Administered 2019-08-31 (×2): 3 [IU] via SUBCUTANEOUS
  Administered 2019-09-01 (×2): 7 [IU] via SUBCUTANEOUS
  Administered 2019-09-01: 2 [IU] via SUBCUTANEOUS
  Administered 2019-09-02 (×2): 3 [IU] via SUBCUTANEOUS
  Administered 2019-09-02: 2 [IU] via SUBCUTANEOUS

## 2019-08-28 MED ORDER — ATORVASTATIN CALCIUM 40 MG PO TABS
80.0000 mg | ORAL_TABLET | Freq: Every day | ORAL | Status: DC
  Administered 2019-08-29 – 2019-09-02 (×5): 80 mg via ORAL
  Filled 2019-08-28 (×5): qty 2

## 2019-08-28 MED ORDER — ACETAMINOPHEN 650 MG RE SUPP: 650.0000 mg | Freq: Four times a day (QID) | RECTAL | Status: DC | PRN

## 2019-08-28 MED ORDER — HYDROCOD POLST-CPM POLST ER 10-8 MG/5ML PO SUER
5.0000 mL | Freq: Once | ORAL | Status: AC
  Administered 2019-08-28: 5 mL via ORAL
  Filled 2019-08-28: qty 5

## 2019-08-28 MED ORDER — SODIUM CHLORIDE 0.9 % IV SOLN: 250.0000 mL | INTRAVENOUS | Status: DC | PRN

## 2019-08-28 MED ORDER — ONDANSETRON HCL 4 MG/2ML IJ SOLN: 4.0000 mg | Freq: Four times a day (QID) | INTRAMUSCULAR | Status: DC | PRN

## 2019-08-28 MED ORDER — SODIUM CHLORIDE 0.9% FLUSH
3.0000 mL | Freq: Two times a day (BID) | INTRAVENOUS | Status: DC
  Administered 2019-08-29 – 2019-09-02 (×9): 3 mL via INTRAVENOUS

## 2019-08-28 MED ORDER — CLOPIDOGREL BISULFATE 75 MG PO TABS
75.0000 mg | ORAL_TABLET | Freq: Every day | ORAL | Status: DC
  Administered 2019-08-29 – 2019-09-02 (×5): 75 mg via ORAL
  Filled 2019-08-28 (×5): qty 1

## 2019-08-28 MED ORDER — IOHEXOL 350 MG/ML SOLN
80.0000 mL | Freq: Once | INTRAVENOUS | Status: AC | PRN
  Administered 2019-08-28: 80 mL via INTRAVENOUS
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
## 2019-08-28 NOTE — ED Triage Notes (Signed)
Pt arrived [facility] from home for c/o CP, SOB, generalized weakness, BLE pain, headache, and fever. VS with EMS  BP 106/68 P85 96% RA 101.8 Temp (given 1G tylenol en route) CBG 89 20 LFA

## 2019-08-28 NOTE — ED Notes (Signed)
Pt.was ambulated in rm, O2 sats started @92  then dropped to 90. Pts resp. Were gurgly while being ambulated. Pt was brought back to the bed and placed on nasal cannula @ 2L.

## 2019-08-29 ENCOUNTER — Other Ambulatory Visit: Payer: Self-pay

## 2019-08-29 ENCOUNTER — Encounter (HOSPITAL_COMMUNITY): Payer: Self-pay

## 2019-08-29 DIAGNOSIS — I10 Essential (primary) hypertension: Secondary | ICD-10-CM | POA: Diagnosis not present

## 2019-08-29 DIAGNOSIS — J440 Chronic obstructive pulmonary disease with acute lower respiratory infection: Secondary | ICD-10-CM | POA: Diagnosis not present

## 2019-08-29 DIAGNOSIS — J9601 Acute respiratory failure with hypoxia: Secondary | ICD-10-CM | POA: Diagnosis present

## 2019-08-29 DIAGNOSIS — R6510 Systemic inflammatory response syndrome (SIRS) of non-infectious origin without acute organ dysfunction: Secondary | ICD-10-CM | POA: Diagnosis not present

## 2019-08-29 DIAGNOSIS — E785 Hyperlipidemia, unspecified: Secondary | ICD-10-CM | POA: Diagnosis not present

## 2019-08-29 DIAGNOSIS — E875 Hyperkalemia: Secondary | ICD-10-CM | POA: Diagnosis not present

## 2019-08-29 DIAGNOSIS — G9341 Metabolic encephalopathy: Secondary | ICD-10-CM | POA: Diagnosis present

## 2019-08-29 DIAGNOSIS — K219 Gastro-esophageal reflux disease without esophagitis: Secondary | ICD-10-CM | POA: Diagnosis not present

## 2019-08-29 DIAGNOSIS — E119 Type 2 diabetes mellitus without complications: Secondary | ICD-10-CM | POA: Diagnosis not present

## 2019-08-29 DIAGNOSIS — Z951 Presence of aortocoronary bypass graft: Secondary | ICD-10-CM | POA: Diagnosis not present

## 2019-08-29 DIAGNOSIS — Z7984 Long term (current) use of oral hypoglycemic drugs: Secondary | ICD-10-CM | POA: Diagnosis not present

## 2019-08-29 DIAGNOSIS — Z955 Presence of coronary angioplasty implant and graft: Secondary | ICD-10-CM | POA: Diagnosis not present

## 2019-08-29 DIAGNOSIS — I2510 Atherosclerotic heart disease of native coronary artery without angina pectoris: Secondary | ICD-10-CM | POA: Diagnosis present

## 2019-08-29 DIAGNOSIS — Z7982 Long term (current) use of aspirin: Secondary | ICD-10-CM | POA: Diagnosis not present

## 2019-08-29 DIAGNOSIS — U071 COVID-19: Principal | ICD-10-CM | POA: Diagnosis present

## 2019-08-29 DIAGNOSIS — E11649 Type 2 diabetes mellitus with hypoglycemia without coma: Secondary | ICD-10-CM | POA: Diagnosis not present

## 2019-08-29 DIAGNOSIS — R0602 Shortness of breath: Secondary | ICD-10-CM | POA: Diagnosis present

## 2019-08-29 DIAGNOSIS — I252 Old myocardial infarction: Secondary | ICD-10-CM | POA: Diagnosis not present

## 2019-08-29 DIAGNOSIS — J1289 Other viral pneumonia: Secondary | ICD-10-CM | POA: Diagnosis not present

## 2019-08-29 DIAGNOSIS — Z7902 Long term (current) use of antithrombotics/antiplatelets: Secondary | ICD-10-CM | POA: Diagnosis not present

## 2019-08-29 DIAGNOSIS — Z87891 Personal history of nicotine dependence: Secondary | ICD-10-CM | POA: Diagnosis not present

## 2019-08-29 DIAGNOSIS — Z7951 Long term (current) use of inhaled steroids: Secondary | ICD-10-CM | POA: Diagnosis not present

## 2019-08-29 LAB — COMPREHENSIVE METABOLIC PANEL
ALT: 21 U/L (ref 0–44)
AST: 22 U/L (ref 15–41)
Albumin: 3.5 g/dL (ref 3.5–5.0)
Alkaline Phosphatase: 73 U/L (ref 38–126)
Anion gap: 7 (ref 5–15)
BUN: 15 mg/dL (ref 8–23)
CO2: 28 mmol/L (ref 22–32)
Calcium: 8.9 mg/dL (ref 8.9–10.3)
Chloride: 100 mmol/L (ref 98–111)
Creatinine, Ser: 0.97 mg/dL (ref 0.61–1.24)
GFR calc Af Amer: >60 mL/min (ref >60)
GFR calc non Af Amer: >60 mL/min (ref >60)
Glucose, Bld: 122 mg/dL — ABNORMAL HIGH (ref 70–99)
Potassium: 4.1 mmol/L (ref 3.5–5.1)
Sodium: 135 mmol/L (ref 135–145)
Total Bilirubin: 0.3 mg/dL (ref 0.3–1.2)
Total Protein: 6.3 g/dL — ABNORMAL LOW (ref 6.5–8.1)

## 2019-08-29 LAB — GLUCOSE, CAPILLARY
Glucose-Capillary: 129 mg/dL — ABNORMAL HIGH (ref 70–99)
Glucose-Capillary: 102 mg/dL — ABNORMAL HIGH (ref 70–99)
Glucose-Capillary: 113 mg/dL — ABNORMAL HIGH (ref 70–99)
Glucose-Capillary: 192 mg/dL — ABNORMAL HIGH (ref 70–99)
Glucose-Capillary: 233 mg/dL — ABNORMAL HIGH (ref 70–99)

## 2019-08-29 LAB — HEMOGLOBIN A1C
Hgb A1c MFr Bld: 6.8 % — ABNORMAL HIGH (ref 4.8–5.6)
Mean Plasma Glucose: 148.46 mg/dL

## 2019-08-29 LAB — PROCALCITONIN: Procalcitonin: <0.1 ng/mL

## 2019-08-29 LAB — PHOSPHORUS: Phosphorus: 4.8 mg/dL — ABNORMAL HIGH (ref 2.5–4.6)

## 2019-08-29 LAB — D-DIMER, QUANTITATIVE: D-Dimer, Quant: 0.51 ug/mL-FEU — ABNORMAL HIGH (ref 0.00–0.50)

## 2019-08-29 LAB — SAMPLE TO BLOOD BANK

## 2019-08-29 LAB — CBG MONITORING, ED
Glucose-Capillary: 44 mg/dL — CL (ref 70–99)
Glucose-Capillary: 68 mg/dL — ABNORMAL LOW (ref 70–99)
Glucose-Capillary: 105 mg/dL — ABNORMAL HIGH (ref 70–99)

## 2019-08-29 LAB — C-REACTIVE PROTEIN: CRP: 1.1 mg/dL — ABNORMAL HIGH (ref <1.0)

## 2019-08-29 LAB — MAGNESIUM: Magnesium: 1.8 mg/dL (ref 1.7–2.4)

## 2019-08-29 MED ORDER — SODIUM CHLORIDE 0.9 % IV SOLN
100.0000 mg | INTRAVENOUS | Status: AC
  Administered 2019-08-30 – 2019-09-02 (×4): 100 mg via INTRAVENOUS
  Filled 2019-08-29 (×4): qty 20

## 2019-08-29 MED ORDER — DEXAMETHASONE SODIUM PHOSPHATE 10 MG/ML IJ SOLN
6.0000 mg | Freq: Every day | INTRAMUSCULAR | Status: DC
  Administered 2019-08-29 – 2019-09-02 (×5): 6 mg via INTRAVENOUS
  Filled 2019-08-29 (×5): qty 1

## 2019-08-29 MED ORDER — SODIUM CHLORIDE 0.9 % IV SOLN
200.0000 mg | Freq: Once | INTRAVENOUS | Status: AC
  Administered 2019-08-29: 200 mg via INTRAVENOUS
  Filled 2019-08-29: qty 40

## 2019-08-29 NOTE — TOC Initial Note (Signed)
Transition of Care [facility]) - Initial/Assessment Note    Patient Details  Name: Ian Morton MRN: [id number] Date of Birth: 27-Aug-1951  [facility] of Care [facility]) CM/SW Contact:    Ninfa Meeker, RN Phone Number: [phone] (working remotely) 08/29/2019, 2:06 PM  Clinical Narrative:   68 yr old male admitted from home with COVID related symptoms. Case manager will continue to follow for appropriate disposition as patient medically improves. May he be blessed to do so.                       Patient Goals and CMS Choice        Expected Discharge Plan and Services                                                Prior Living Arrangements/Services                       Activities of Daily Living Home Assistive Devices/Equipment: None ADL Screening (condition at time of admission) Patient's cognitive ability adequate to safely complete daily activities?: Yes Is the patient deaf or have difficulty hearing?: No Does the patient have difficulty seeing, even when wearing glasses/contacts?: No Does the patient have difficulty concentrating, remembering, or making decisions?: No Patient able to express need for assistance with ADLs?: Yes Does the patient have difficulty dressing or bathing?: No Independently performs ADLs?: Yes (appropriate for developmental age) Does the patient have difficulty walking or climbing stairs?: No Weakness of Legs: Both Weakness of Arms/Hands: None  Permission Sought/Granted                  Emotional Assessment              Admission diagnosis:  Viral pneumonia [J12.9] Respiratory distress [R06.03] Patient Active Problem List   Diagnosis Date Noted  . COVID-19 virus detected 08/29/2019  . Acute metabolic encephalopathy 71/69/6789  . COVID-19 virus infection 08/28/2019  . SIRS (systemic inflammatory response syndrome) ([place]) 08/28/2019  . COPD (chronic obstructive pulmonary disease) ([place]) 08/28/2019  . 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 Chest pain 09/14/2015  . NSTEMI (non-ST elevated myocardial infarction) ([place]) 09/14/2015  . Other B-complex deficiencies 04/04/2014  . Fissure in skin of foot 01/27/2014  . Unspecified local infection of skin and subcutaneous tissue 01/27/2014  . Erythema of toe 01/27/2014  . Dyspnea and respiratory abnormality 11/05/2013  . Restrictive lung disease 11/05/2013  . Dyslipidemia   . GERD (gastroesophageal reflux disease)   . Coronary artery disease   . MI (myocardial infarction) ([place])   . Memory loss 09/02/2013  . Tinea pedis 04/22/2013  . Pain in joint, ankle and foot 02/20/2013  . Dry skin 02/20/2013  . Sprain of lumbosacral (joint) (ligament) 01/22/2013  . Sprain of neck 01/22/2013  . Abdominal pain 12/21/2011  . Hypertension 12/21/2011  . Diabetes mellitus type II, non insulin dependent ([place]) 12/21/2011   PCP:  Benito Mccreedy, MD Pharmacy:   [facility] [phone] - [place], [place] AT [address] [place] [address] Phone: [phone] Fax: [phone]     Social Determinants of Health (SDOH) Interventions    Readmission Risk Interventions No flowsheet data found.

## 2019-08-29 NOTE — Progress Notes (Signed)
PROGRESS NOTE    Ian Morton  BBC:[id number] DOB: [birth year] DOA: 08/28/2019 PCP: Jackie Plum, MD   Brief Narrative:  Ian Morton is a 68 y.o. male with medical history significant for COPD, coronary artery disease, hypertension, and type 2 diabetes mellitus, now presenting to the emergency department for evaluation of fevers, chills, cough, shortness of breath, and aches.  Patient reports that he developed general malaise and myalgias 4 days ago, noted fevers and chills around the same time, and then developed a cough and shortness of breath 2 days ago.  Cough and dyspnea has continued to worsen, patient continues to have fevers and aches, reports feeling progressively weak in general, was short of breath at rest today, and called EMS.  Patient was found to have a fever to 101.8 F with EMS and was treated with Tylenol prior to arrival in the ED.  Patient acknowledges having chest pain when asked, but explains that he is aching all over. He has had intermittent headaches but denies change in vision or hearing or focal numbness or weakness, and denies neck stiffness.  He does not know of any sick contacts. Upon arrival to the ED, patient is found to be afebrile after treatment with Tylenol by EMS, saturating 90% on room air, tachypneic, and with stable blood pressure.  EKG features a sinus rhythm and chest x-ray is negative for acute cardiopulmonary disease.  Chemistry panel and CBC are unremarkable.  Patient was given a liter of normal saline and Zofran in the emergency department, COVID-19 testing is in process, and hospitalists consulted for admission.   Assessment & Plan:   Principal Problem:   COVID-19 virus infection Active Problems:   Hypertension   Diabetes mellitus type II, non insulin dependent (HCC)   Coronary artery disease   SIRS (systemic inflammatory response syndrome) (HCC)   COPD (chronic obstructive pulmonary disease) (HCC)   COVID-19 virus detected   Acute metabolic  encephalopathy   Acute hypoxic respiratory failure in the setting of sepsis, COVID-19 infection POA  - Presents with fevers, cough, SOB, aches, and malaise, was febrile with EMS and treated with APAP pta, and found to be tachypneic at rest with clear CXR    -Patient remains hypoxic, as low as 82% on room air today -Add Remdesivir/dexamethasone No results for input(s): DDIMER, FERRITIN, LDH, CRP in the last 72 hours.  Lab Results  Component Value Date   SARSCOV2NAA POSITIVE (A) 08/28/2019    Acute metabolic encephalopathy, secondary to above  -Continue supportive care as above -Patient requesting we not contact family -patient remains altered will need to verify patient's baseline however  COPD, not in acute exacerbation  - No wheezing on admission  - Continue ICS/LABA and as needed albuterol    History of CAD  - Acknowledges pleuritic chest pain, worse with deep inspiration, unlikely cardiac in etiology  -Troponin remains negative - No acute ischemic features on EKG  - Continue ASA, Plavix, beta-blocker, and ACE-i    Type II DM  -A1c pending  - Managed with metformin at home, held on admission  - Use low-intensity SSI with Novolog while in hospital   -Mild episode of hypoglycemia today given poor p.o. intake in the setting of mental status  Hypertension, essential, well-controlled  - BP at goal, continue metoprolol and lisinopril     DVT prophylaxis: Lovenox  Code Status: Full  Family Communication: Discussed with patient -patient refusing further discussion with family at this time Consults called: None Admission status:  Patient converted to  inpatient given ongoing mental status changes, hypoxia, need for IV steroids, Remdesivir and ongoing respiratory failure in the setting of COVID-19 infection.    Subjective: No acute issues or events overnight, patient somewhat altered but able to review general review of systems, indicates ongoing pleuritic chest pain but  minimally improving.  Continues to indicate ongoing shortness of breath even at rest but denies fevers, chills, nausea, vomiting, diarrhea, constipation.  Objective: Vitals:   08/29/19 0130 08/29/19 0440 08/29/19 0727 08/29/19 1300  BP: (!) 147/92 (!) 123/59 122/73   Pulse: 79 88 80   Resp: (!) 21 20 (!) 24   Temp:  98.9 F (37.2 C) 98.7 F (37.1 C)   TempSrc:  Oral Oral   SpO2: 97% 96% 99% (!) 82%  Weight:  87.2 kg    Height:  5\' 10"  (1.778 m)      Intake/Output Summary (Last 24 hours) at 08/29/2019 1402 Last data filed at 08/29/2019 1000 Gross per 24 hour  Intake 760 ml  Output 400 ml  Net 360 ml   Filed Weights   08/29/19 0440  Weight: 87.2 kg    Examination: General:  Pleasantly resting in bed, No acute distress.  Alert to person and general situation -difficulty recalling date HEENT:  Normocephalic atraumatic.  Sclerae nonicteric, noninjected.  Extraocular movements intact bilaterally. Neck:  Without mass or deformity.  Trachea is midline. Lungs: Coarse breath sounds bilaterally without overt rhonchi, wheeze, or rales. Heart:  Regular rate and rhythm.  Without murmurs, rubs, or gallops. Abdomen:  Soft, nontender, nondistended.  Without guarding or rebound. Extremities: Without cyanosis, clubbing, edema, or obvious deformity. Vascular:  Dorsalis pedis and posterior tibial pulses palpable bilaterally. Skin:  Warm and dry, no erythema, no ulcerations.   Data Reviewed: I have personally reviewed following labs and imaging studies  CBC: Recent Labs  Lab 08/28/19 2016  WBC 9.0  NEUTROABS 7.6  HGB 14.1  HCT 41.9  MCV 89.5  PLT 180   Basic Metabolic Panel: Recent Labs  Lab 08/28/19 2016  NA 135  K 3.9  CL 102  CO2 23  GLUCOSE 109*  BUN 16  CREATININE 1.00  CALCIUM 8.8*   GFR: Estimated Creatinine Clearance: 71 mL/min (by C-G formula based on SCr of 1 mg/dL). Liver Function Tests: No results for input(s): AST, ALT, ALKPHOS, BILITOT, PROT, ALBUMIN in the  last 168 hours. No results for input(s): LIPASE, AMYLASE in the last 168 hours. No results for input(s): AMMONIA in the last 168 hours. Coagulation Profile: No results for input(s): INR, PROTIME in the last 168 hours. Cardiac Enzymes: No results for input(s): CKTOTAL, CKMB, CKMBINDEX, TROPONINI in the last 168 hours. BNP (last 3 results) No results for input(s): PROBNP in the last 8760 hours. HbA1C: No results for input(s): HGBA1C in the last 72 hours. CBG: Recent Labs  Lab 08/29/19 0145 08/29/19 0214 08/29/19 0249 08/29/19 0730 08/29/19 1049  GLUCAP 44* 68* 105* 129* 102*   Lipid Profile: No results for input(s): CHOL, HDL, LDLCALC, TRIG, CHOLHDL, LDLDIRECT in the last 72 hours. Thyroid Function Tests: No results for input(s): TSH, T4TOTAL, FREET4, T3FREE, THYROIDAB in the last 72 hours. Anemia Panel: No results for input(s): VITAMINB12, FOLATE, FERRITIN, TIBC, IRON, RETICCTPCT in the last 72 hours. Sepsis Labs: No results for input(s): PROCALCITON, LATICACIDVEN in the last 168 hours.  Recent Results (from the past 240 hour(s))  SARS Coronavirus 2 [facility](Hospital order, [facility]) Nasopharyngeal Nasopharyngeal Swab     Status: Abnormal  Collection Time: 08/28/19  8:13 PM   Specimen: Nasopharyngeal Swab  Result Value Ref Range Status   SARS Coronavirus 2 POSITIVE (A) NEGATIVE Final    Comment: RESULT CALLED TO, READ BACK BY AND VERIFIED WITH: L. VENEGAS,RN [id number] 08/28/2019 T. TYSOR (NOTE) If result is NEGATIVE SARS-CoV-2 target nucleic acids are NOT DETECTED. The SARS-CoV-2 RNA is generally detectable in upper and lower  respiratory specimens during the acute phase of infection. The lowest  concentration of SARS-CoV-2 viral copies this assay can detect is 250  copies / mL. A negative result does not preclude SARS-CoV-2 infection  and should not be used as the sole basis for treatment or other  patient management decisions.  A negative result may  occur with  improper specimen collection / handling, submission of specimen other  than nasopharyngeal swab, presence of viral mutation(s) within the  areas targeted by this assay, and inadequate number of viral copies  (<250 copies / mL). A negative result must be combined with clinical  observations, patient history, and epidemiological information. If result is POSITIVE SARS-CoV-2 target nucleic acids are DETECTED.  The SARS-CoV-2 RNA is generally detectable in upper and lower  respiratory specimens during the acute phase of infection.  Positive  results are indicative of active infection with SARS-CoV-2.  Clinical  correlation with patient history and other diagnostic information is  necessary to determine patient infection status.  Positive results do  not rule out bacterial infection or co-infection with other viruses. If result is PRESUMPTIVE POSTIVE SARS-CoV-2 nucleic acids MAY BE PRESENT.   A presumptive positive result was obtained on the submitted specimen  and confirmed on repeat testing.  While 2019 novel coronavirus  (SARS-CoV-2) nucleic acids may be present in the submitted sample  additional confirmatory testing may be necessary for epidemiological  and / or clinical management purposes  to differentiate between  SARS-CoV-2 and other Sarbecovirus currently known to infect humans.  If clinically indicated additional testing with an alternate test  methodology [phone])  is advised. The SARS-CoV-2 RNA is generally  detectable in upper and lower respiratory specimens during the acute  phase of infection. The expected result is Negative. Fact Sheet for Patients:  StrictlyIdeas.no Fact Sheet for Healthcare Providers: BankingDealers.co.za This test is not yet approved or cleared by the [place] FDA and has been authorized for detection and/or diagnosis of SARS-CoV-2 by FDA under an Emergency Use Authorization (EUA).  This  EUA will remain in effect (meaning this test can be used) for the duration of the COVID-19 declaration under Section 564(b)(1) of the Act, 21 U.S.C. section 360bbb-3(b)(1), unless the authorization is terminated or revoked sooner. Performed at [facility], [place] [address] [place], [place] [address]          Radiology Studies: Ct Angio Chest Pe W And/or Wo Contrast  Result Date: 08/28/2019 CLINICAL DATA:  Chest pain, shortness of breath EXAM: CT ANGIOGRAPHY CHEST WITH CONTRAST TECHNIQUE: Multidetector CT imaging of the chest was performed using the standard protocol during bolus administration of intravenous contrast. Multiplanar CT image reconstructions and MIPs were obtained to evaluate the vascular anatomy. CONTRAST:  20mL OMNIPAQUE IOHEXOL 350 MG/ML SOLN COMPARISON:  05/31/2019 FINDINGS: Cardiovascular: No filling defects in the pulmonary arteries to suggest pulmonary emboli. Prior CABG. Heart is borderline in size. Scattered aortic atherosclerosis. No aneurysm. Mediastinum/Nodes: No mediastinal, hilar, or axillary adenopathy. Trachea and esophagus are unremarkable. Thyroid unremarkable. Lungs/Pleura: Linear scarring in the lingula at the left base. No confluent opacities or effusions.  Upper Abdomen: Imaging into the upper abdomen shows no acute findings. Musculoskeletal: Chest wall soft tissues are unremarkable. No acute bony abnormality. Review of the MIP images confirms the above findings. IMPRESSION: No evidence of pulmonary embolus. Prior CABG. Aortic Atherosclerosis (ICD10-I70.0). No acute cardiopulmonary disease. Electronically Signed   By: Charlett Nose M.D.   On: 08/28/2019 23:18   Dg Chest Port 1 View  Result Date: 08/28/2019 CLINICAL DATA:  Shortness of breath, cough and chest pain. EXAM: PORTABLE CHEST 1 VIEW COMPARISON:  07/05/2019 FINDINGS: Previous median sternotomy. Heart size is normal. Chronic aortic atherosclerosis. Chronic interstitial markings more prominent in the  mid and lower lungs. No sign of active infiltrate, collapse or effusion. No acute finding. IMPRESSION: No active disease. Previous median sternotomy. Aortic atherosclerosis. Chronic interstitial lung markings more prominent in the lower lungs. Electronically Signed   By: Paulina Fusi M.D.   On: 08/28/2019 20:42   Scheduled Meds: . aspirin  325 mg Oral Daily  . atorvastatin  80 mg Oral q1800  . clopidogrel  75 mg Oral Daily  . enoxaparin (LOVENOX) injection  40 mg Subcutaneous Q24H  . insulin aspart  0-5 Units Subcutaneous QHS  . insulin aspart  0-9 Units Subcutaneous TID WC  . lisinopril  10 mg Oral Daily  . metoprolol succinate  25 mg Oral Daily  . mometasone-formoterol  2 puff Inhalation BID  . sodium chloride flush  3 mL Intravenous Q12H  . sodium chloride flush  3 mL Intravenous Q12H   Continuous Infusions: . sodium chloride       LOS: 0 days    Time spent: 35 min   Azucena Fallen, DO [facility]  If 7PM-7AM, please contact night-coverage [email or web address] Password [email or web address] 08/29/2019, 2:02 PM
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
## 2019-08-29 NOTE — Progress Notes (Signed)
Prescription fills record at [facility]:

## 2019-08-29 NOTE — Plan of Care (Signed)
  Problem: Education: Goal: Knowledge of risk factors and measures for prevention of condition will improve Outcome: Progressing   Problem: Coping: Goal: Psychosocial and spiritual needs will be supported Outcome: Progressing   Problem: Respiratory: Goal: Will maintain a patent airway Outcome: Progressing   Problem: Respiratory: Goal: Complications related to the disease process, condition or treatment will be avoided or minimized Outcome: Progressing  POC reviewed with patient, pt reports full understanding

## 2019-08-29 NOTE — Plan of Care (Signed)
New admit. Pt will progressing towards goal

## 2019-08-29 NOTE — ED Notes (Signed)
Pt CBG 44, given 8 oz of juice and several packs of graham crackers, will reassess in 15 min.

## 2019-08-29 NOTE — Progress Notes (Signed)
Report given to me by Peyton Bottoms RN during BSR. POC reviewed. Pt then came out of room after shift change, upset d/t "I called 3 times and nobody came". I explained to him that I was in another pts room and that I will always answer his call light when he calls but that it may not always be immediately.  He insists that he has a fever but temp is currently 98.2, I showed him the thermometer but he believes that it is wrong. I did retake his temp with same result.

## 2019-08-29 NOTE — Progress Notes (Signed)
Per MD request I attmepted to remove patient's O2 and have him walk a little bit. The patient desatted down to the lower 80's when standing after removing his O2. Once the patient was back on O2 and sitting his O2 sat came back up to the 90's.

## 2019-08-29 NOTE — ED Notes (Addendum)
Pt is refusing blood draws at this time, educated on importance and is still refusing.

## 2019-08-29 NOTE — ED Notes (Signed)
[facility] Called 

## 2019-08-29 NOTE — Progress Notes (Signed)
Pt was admitted for COVID. Remdesivir was ordered. He is satting<94% on RA. ALT came back wnl. Scr<1.  Remdesivir 200mg  IV x1 then 100mg   IV q24 x4d  Onnie Boer, PharmD, [place], AAHIVP, CPP Infectious Disease Pharmacist 08/29/2019 4:52 PM

## 2019-08-29 NOTE — ED Notes (Signed)
TELE 

## 2019-08-29 NOTE — ED Notes (Signed)
Pt CBG 68, given 8 more oz of juice and several more graham crackers, will reassess in 15 min.

## 2019-08-30 LAB — D-DIMER, QUANTITATIVE: D-Dimer, Quant: 0.56 ug/mL-FEU — ABNORMAL HIGH (ref 0.00–0.50)

## 2019-08-30 LAB — COMPREHENSIVE METABOLIC PANEL
ALT: 24 U/L (ref 0–44)
AST: 25 U/L (ref 15–41)
Albumin: 3.9 g/dL (ref 3.5–5.0)
Alkaline Phosphatase: 76 U/L (ref 38–126)
Anion gap: 8 (ref 5–15)
BUN: 18 mg/dL (ref 8–23)
CO2: 29 mmol/L (ref 22–32)
Calcium: 9.3 mg/dL (ref 8.9–10.3)
Chloride: 99 mmol/L (ref 98–111)
Creatinine, Ser: 0.98 mg/dL (ref 0.61–1.24)
GFR calc Af Amer: >60 mL/min (ref >60)
GFR calc non Af Amer: >60 mL/min (ref >60)
Glucose, Bld: 183 mg/dL — ABNORMAL HIGH (ref 70–99)
Potassium: 4.7 mmol/L (ref 3.5–5.1)
Sodium: 136 mmol/L (ref 135–145)
Total Bilirubin: 0.5 mg/dL (ref 0.3–1.2)
Total Protein: 7 g/dL (ref 6.5–8.1)

## 2019-08-30 LAB — CBC WITH DIFFERENTIAL/PLATELET
Abs Immature Granulocytes: 0.02 10*3/uL (ref 0.00–0.07)
Basophils Absolute: 0 10*3/uL (ref 0.0–0.1)
Basophils Relative: 0 %
Eosinophils Absolute: 0 10*3/uL (ref 0.0–0.5)
Eosinophils Relative: 0 %
HCT: 46.3 % (ref 39.0–52.0)
Hemoglobin: 14.9 g/dL (ref 13.0–17.0)
Immature Granulocytes: 0 %
Lymphocytes Relative: 9 %
Lymphs Abs: 0.7 10*3/uL (ref 0.7–4.0)
MCH: 29.2 pg (ref 26.0–34.0)
MCHC: 32.2 g/dL (ref 30.0–36.0)
MCV: 90.6 fL (ref 80.0–100.0)
Monocytes Absolute: 0.5 10*3/uL (ref 0.1–1.0)
Monocytes Relative: 6 %
Neutro Abs: 6.7 10*3/uL (ref 1.7–7.7)
Neutrophils Relative %: 85 %
Platelets: 165 10*3/uL (ref 150–400)
RBC: 5.11 MIL/uL (ref 4.22–5.81)
RDW: 13.4 % (ref 11.5–15.5)
WBC: 7.9 10*3/uL (ref 4.0–10.5)
nRBC: 0 % (ref 0.0–0.2)

## 2019-08-30 LAB — PHOSPHORUS: Phosphorus: 4.1 mg/dL (ref 2.5–4.6)

## 2019-08-30 LAB — GLUCOSE, CAPILLARY
Glucose-Capillary: 101 mg/dL — ABNORMAL HIGH (ref 70–99)
Glucose-Capillary: 171 mg/dL — ABNORMAL HIGH (ref 70–99)
Glucose-Capillary: 235 mg/dL — ABNORMAL HIGH (ref 70–99)
Glucose-Capillary: 221 mg/dL — ABNORMAL HIGH (ref 70–99)

## 2019-08-30 LAB — C-REACTIVE PROTEIN: CRP: 1 mg/dL — ABNORMAL HIGH (ref <1.0)

## 2019-08-30 LAB — MAGNESIUM: Magnesium: 2 mg/dL (ref 1.7–2.4)

## 2019-08-30 LAB — FERRITIN: Ferritin: 59 ng/mL (ref 24–336)

## 2019-08-30 LAB — LACTATE DEHYDROGENASE: LDH: 141 U/L (ref 98–192)

## 2019-08-30 MED ORDER — ACYCLOVIR 400 MG PO TABS
400.0000 mg | ORAL_TABLET | Freq: Two times a day (BID) | ORAL | Status: DC
  Filled 2019-08-30: qty 1

## 2019-08-30 MED ORDER — LINACLOTIDE 290 MCG PO CAPS
290.0000 ug | ORAL_CAPSULE | Freq: Every day | ORAL | Status: DC
  Administered 2019-08-30 – 2019-09-02 (×4): 290 ug via ORAL
  Filled 2019-08-30 (×5): qty 1

## 2019-08-30 MED ORDER — TAMSULOSIN HCL 0.4 MG PO CAPS
0.4000 mg | ORAL_CAPSULE | Freq: Every day | ORAL | Status: DC
  Administered 2019-08-30 – 2019-09-01 (×3): 0.4 mg via ORAL
  Filled 2019-08-30 (×3): qty 1

## 2019-08-30 MED ORDER — RISPERIDONE 0.5 MG PO TABS
0.5000 mg | ORAL_TABLET | Freq: Two times a day (BID) | ORAL | Status: DC
  Administered 2019-08-30 – 2019-09-02 (×7): 0.5 mg via ORAL
  Filled 2019-08-30 (×9): qty 1

## 2019-08-30 MED ORDER — ACYCLOVIR 200 MG PO CAPS
400.0000 mg | ORAL_CAPSULE | Freq: Two times a day (BID) | ORAL | Status: DC
  Administered 2019-08-30 – 2019-09-02 (×7): 400 mg via ORAL
  Filled 2019-08-30 (×9): qty 2

## 2019-08-30 MED ORDER — DULOXETINE HCL 60 MG PO CPEP
60.0000 mg | ORAL_CAPSULE | Freq: Two times a day (BID) | ORAL | Status: DC
  Administered 2019-08-30 – 2019-09-02 (×7): 60 mg via ORAL
  Filled 2019-08-30 (×9): qty 1

## 2019-08-30 NOTE — Progress Notes (Signed)
PROGRESS NOTE    Ian Morton  FIE:[id number] DOB: 1951/09/21 DOA: 08/28/2019 PCP: Jackie Plum, MD   Brief Narrative:  Ian Morton is a 68 y.o. male with medical history significant for COPD, coronary artery disease, hypertension, and type 2 diabetes mellitus, now presenting to the emergency department for evaluation of fevers, chills, cough, shortness of breath, and aches.  Patient reports that he developed general malaise and myalgias 4 days ago, noted fevers and chills around the same time, and then developed a cough and shortness of breath 2 days ago.  Cough and dyspnea has continued to worsen, patient continues to have fevers and aches, reports feeling progressively weak in general, was short of breath at rest today, and called EMS.  Patient was found to have a fever to 101.8 F with EMS and was treated with Tylenol prior to arrival in the ED.  Patient acknowledges having chest pain when asked, but explains that he is aching all over. He has had intermittent headaches but denies change in vision or hearing or focal numbness or weakness, and denies neck stiffness.  He does not know of any sick contacts. Upon arrival to the ED, patient is found to be afebrile after treatment with Tylenol by EMS, saturating 90% on room air, tachypneic, and with stable blood pressure.  EKG features a sinus rhythm and chest x-ray is negative for acute cardiopulmonary disease.  Chemistry panel and CBC are unremarkable.  Patient was given a liter of normal saline and Zofran in the emergency department, COVID-19 testing is in process, and hospitalists consulted for admission.   Assessment & Plan:   Principal Problem:   COVID-19 virus infection Active Problems:   Hypertension   Diabetes mellitus type II, non insulin dependent (HCC)   Coronary artery disease   SIRS (systemic inflammatory response syndrome) (HCC)   COPD (chronic obstructive pulmonary disease) (HCC)   COVID-19 virus detected   Acute metabolic  encephalopathy   Acute hypoxic respiratory failure in the setting of sepsis, COVID-19 infection POA  - Presents with fevers, cough, SOB, aches, and malaise, was febrile with EMS and treated with APAP pta, and found to be tachypneic at rest with clear CXR    -Patient remains hypoxic, as low as 84-85% on room air today - requires  -Remdesivir tentative stop date 09/02/2019 -Continue dexamethasone per protocol Recent Labs    08/29/19 1530 08/30/19 0115  DDIMER 0.51* 0.56*  FERRITIN  --  59  LDH  --  141  CRP 1.1* 1.0*    Acute metabolic encephalopathy, secondary to above, improving -Continue supportive care as above -Patient requesting we not contact family -patient remains altered will need to verify patient's baseline however  COPD, not in acute exacerbation  - No wheezing on admission  - Continue ICS/LABA and as needed albuterol    History of CAD  - Acknowledges pleuritic chest pain, worse with deep inspiration, unlikely cardiac in etiology  -Troponin remains negative - No acute ischemic features on EKG  - Continue ASA, Plavix, beta-blocker, and ACE-i    Type II DM , non insulin dependent -A1c 6.8  - Managed with metformin at home, held on admission  - Use low-intensity SSI with Novolog while in hospital   -Mild episode of hypoglycemia today given poor p.o. intake in the setting of mental status  Hypertension, essential, well-controlled  - BP at goal, continue metoprolol and lisinopril     DVT prophylaxis: Lovenox  Code Status: Full  Family Communication: Discussed with patient -patient refusing  further discussion with family at this time Consults called: None Admission status:  Patient converted to inpatient given ongoing mental status changes, hypoxia, need for IV steroids, IV Remdesivir and ongoing respiratory failure in the setting of COVID-19 infection.    Subjective: No acute issues or events overnight, patient somewhat altered but able to review general  review of systems, indicates ongoing pleuritic chest pain but minimally improving.  Continues to indicate ongoing shortness of breath even at rest but denies fevers, chills, nausea, vomiting, diarrhea, constipation.  Objective: Vitals:   08/29/19 1552 08/29/19 2000 08/30/19 0000 08/30/19 0400  BP: 121/68 114/72 116/70 113/89  Pulse: 67     Resp: (!) 24 20    Temp:  98.4 F (36.9 C) 98.1 F (36.7 C) 98.8 F (37.1 C)  TempSrc:  Oral Oral Oral  SpO2: 97% 96%  96%  Weight:      Height:        Intake/Output Summary (Last 24 hours) at 08/30/2019 0750 Last data filed at 08/30/2019 0400 Gross per 24 hour  Intake 750 ml  Output 2725 ml  Net -1975 ml   Filed Weights   08/29/19 0440  Weight: 87.2 kg    Examination: General:  Pleasantly resting in bed, No acute distress.  Alert to person and general situation -difficulty recalling date HEENT:  Normocephalic atraumatic.  Sclerae nonicteric, noninjected.  Extraocular movements intact bilaterally. Neck:  Without mass or deformity.  Trachea is midline. Lungs: Coarse breath sounds bilaterally without overt rhonchi, wheeze, or rales. Heart:  Regular rate and rhythm.  Without murmurs, rubs, or gallops. Abdomen:  Soft, nontender, nondistended.  Without guarding or rebound. Extremities: Without cyanosis, clubbing, edema, or obvious deformity. Vascular:  Dorsalis pedis and posterior tibial pulses palpable bilaterally. Skin:  Warm and dry, no erythema, no ulcerations.   Data Reviewed: I have personally reviewed following labs and imaging studies  CBC: Recent Labs  Lab 08/28/19 2016 08/30/19 0115  WBC 9.0 7.9  NEUTROABS 7.6 6.7  HGB 14.1 14.9  HCT 41.9 46.3  MCV 89.5 90.6  PLT 180 165   Basic Metabolic Panel: Recent Labs  Lab 08/28/19 2016 08/29/19 1530 08/30/19 0115  NA 135 135 136  K 3.9 4.1 4.7  CL 102 100 99  CO2 23 28 29   GLUCOSE 109* 122* 183*  BUN 16 15 18   CREATININE 1.00 0.97 0.98  CALCIUM 8.8* 8.9 9.3  MG  --   1.8 2.0  PHOS  --  4.8* 4.1   GFR: Estimated Creatinine Clearance: 72.4 mL/min (by C-G formula based on SCr of 0.98 mg/dL). Liver Function Tests: Recent Labs  Lab 08/29/19 1530 08/30/19 0115  AST 22 25  ALT 21 24  ALKPHOS 73 76  BILITOT 0.3 0.5  PROT 6.3* 7.0  ALBUMIN 3.5 3.9   No results for input(s): LIPASE, AMYLASE in the last 168 hours. No results for input(s): AMMONIA in the last 168 hours. Coagulation Profile: No results for input(s): INR, PROTIME in the last 168 hours. Cardiac Enzymes: No results for input(s): CKTOTAL, CKMB, CKMBINDEX, TROPONINI in the last 168 hours. BNP (last 3 results) No results for input(s): PROBNP in the last 8760 hours. HbA1C: Recent Labs    08/29/19 1530  HGBA1C 6.8*   CBG: Recent Labs  Lab 08/29/19 0730 08/29/19 1049 08/29/19 1551 08/29/19 1825 08/29/19 2056  GLUCAP 129* 102* 113* 192* 233*   Lipid Profile: No results for input(s): CHOL, HDL, LDLCALC, TRIG, CHOLHDL, LDLDIRECT in the last 72 hours. Thyroid Function  Tests: No results for input(s): TSH, T4TOTAL, FREET4, T3FREE, THYROIDAB in the last 72 hours. Anemia Panel: Recent Labs    08/30/19 0115  FERRITIN 59   Sepsis Labs: Recent Labs  Lab 08/29/19 1530  PROCALCITON <0.10    Recent Results (from the past 240 hour(s))  SARS Coronavirus 2 [facility](Hospital order, Per[facility]) Nasopharyngeal Nasopharyngeal Swab     Status: Abnormal   Collection Time: 08/28/19  8:13 PM   Specimen: Nasopharyngeal Swab  Result Value Ref Range Status   SARS Coronavirus 2 POSITIVE (A) NEGATIVE Final    Comment: RESULT CALLED TO, READ BACK BY AND VERIFIED WITH: L. VENEGAS,RN [id number] 08/28/2019 T. TYSOR (NOTE) If result is NEGATIVE SARS-CoV-2 target nucleic acids are NOT DETECTED. The SARS-CoV-2 RNA is generally detectable in upper and lower  respiratory specimens during the acute phase of infection. The lowest  concentration of SARS-CoV-2 viral copies this assay can  detect is 250  copies / mL. A negative result does not preclude SARS-CoV-2 infection  and should not be used as the sole basis for treatment or other  patient management decisions.  A negative result may occur with  improper specimen collection / handling, submission of specimen other  than nasopharyngeal swab, presence of viral mutation(s) within the  areas targeted by this assay, and inadequate number of viral copies  (<250 copies / mL). A negative result must be combined with clinical  observations, patient history, and epidemiological information. If result is POSITIVE SARS-CoV-2 target nucleic acids are DETECTED.  The SARS-CoV-2 RNA is generally detectable in upper and lower  respiratory specimens during the acute phase of infection.  Positive  results are indicative of active infection with SARS-CoV-2.  Clinical  correlation with patient history and other diagnostic information is  necessary to determine patient infection status.  Positive results do  not rule out bacterial infection or co-infection with other viruses. If result is PRESUMPTIVE POSTIVE SARS-CoV-2 nucleic acids MAY BE PRESENT.   A presumptive positive result was obtained on the submitted specimen  and confirmed on repeat testing.  While 2019 novel coronavirus  (SARS-CoV-2) nucleic acids may be present in the submitted sample  additional confirmatory testing may be necessary for epidemiological  and / or clinical management purposes  to differentiate between  SARS-CoV-2 and other Sarbecovirus currently known to infect humans.  If clinically indicated additional testing with an alternate test  methodology [phone]  is advised. The SARS-CoV-2 RNA is generally  detectable in upper and lower respiratory specimens during the acute  phase of infection. The expected result is Negative. Fact Sheet for Patients:  BoilerBrush.com.cyhttps://www.fda.gov/media/136312/download Fact Sheet for Healthcare  Providers: https://pope.com/https://www.fda.gov/media/136313/download This test is not yet approved or cleared by the [place] FDA and has been authorized for detection and/or diagnosis of SARS-CoV-2 by FDA under an Emergency Use Authorization (EUA).  This EUA will remain in effect (meaning this test can be used) for the duration of the COVID-19 declaration under Section 564(b)(1) of the Act, 21 U.S.C. section 360bbb-3(b)(1), unless the authorization is terminated or revoked sooner. Performed at [facility], [address] [place], [place] [address]          Radiology Studies: Ct Angio Chest Pe W And/or Wo Contrast  Result Date: 08/28/2019 CLINICAL DATA:  Chest pain, shortness of breath EXAM: CT ANGIOGRAPHY CHEST WITH CONTRAST TECHNIQUE: Multidetector CT imaging of the chest was performed using the standard protocol during bolus administration of intravenous contrast. Multiplanar CT image reconstructions and  MIPs were obtained to evaluate the vascular anatomy. CONTRAST:  79mL OMNIPAQUE IOHEXOL 350 MG/ML SOLN COMPARISON:  05/31/2019 FINDINGS: Cardiovascular: No filling defects in the pulmonary arteries to suggest pulmonary emboli. Prior CABG. Heart is borderline in size. Scattered aortic atherosclerosis. No aneurysm. Mediastinum/Nodes: No mediastinal, hilar, or axillary adenopathy. Trachea and esophagus are unremarkable. Thyroid unremarkable. Lungs/Pleura: Linear scarring in the lingula at the left base. No confluent opacities or effusions. Upper Abdomen: Imaging into the upper abdomen shows no acute findings. Musculoskeletal: Chest wall soft tissues are unremarkable. No acute bony abnormality. Review of the MIP images confirms the above findings. IMPRESSION: No evidence of pulmonary embolus. Prior CABG. Aortic Atherosclerosis (ICD10-I70.0). No acute cardiopulmonary disease. Electronically Signed   By: Rolm Baptise M.D.   On: 08/28/2019 23:18   Dg Chest Port 1 View  Result Date:  08/28/2019 CLINICAL DATA:  Shortness of breath, cough and chest pain. EXAM: PORTABLE CHEST 1 VIEW COMPARISON:  07/05/2019 FINDINGS: Previous median sternotomy. Heart size is normal. Chronic aortic atherosclerosis. Chronic interstitial markings more prominent in the mid and lower lungs. No sign of active infiltrate, collapse or effusion. No acute finding. IMPRESSION: No active disease. Previous median sternotomy. Aortic atherosclerosis. Chronic interstitial lung markings more prominent in the lower lungs. Electronically Signed   By: Nelson Chimes M.D.   On: 08/28/2019 20:42   Scheduled Meds:  acyclovir  400 mg Oral BID   aspirin  325 mg Oral Daily   atorvastatin  80 mg Oral q1800   clopidogrel  75 mg Oral Daily   dexamethasone (DECADRON) injection  6 mg Intravenous Daily   DULoxetine  60 mg Oral BID   enoxaparin (LOVENOX) injection  40 mg Subcutaneous Q24H   insulin aspart  0-5 Units Subcutaneous QHS   insulin aspart  0-9 Units Subcutaneous TID WC   linaclotide  290 mcg Oral Daily   lisinopril  10 mg Oral Daily   metoprolol succinate  25 mg Oral Daily   mometasone-formoterol  2 puff Inhalation BID   risperiDONE  0.5 mg Oral BID   sodium chloride flush  3 mL Intravenous Q12H   sodium chloride flush  3 mL Intravenous Q12H   tamsulosin  0.4 mg Oral QHS   Continuous Infusions:  sodium chloride     remdesivir 100 mg in NS 250 mL       LOS: 1 day    Time spent: 35 min   Little Ishikawa, DO [facility]  If 7PM-7AM, please contact night-coverage [email or web address] Password [email or web address] 08/30/2019, 7:50 AM

## 2019-08-31 LAB — COMPREHENSIVE METABOLIC PANEL
ALT: 43 U/L (ref 0–44)
AST: 42 U/L — ABNORMAL HIGH (ref 15–41)
Albumin: 4.3 g/dL (ref 3.5–5.0)
Alkaline Phosphatase: 88 U/L (ref 38–126)
Anion gap: 9 (ref 5–15)
BUN: 28 mg/dL — ABNORMAL HIGH (ref 8–23)
CO2: 25 mmol/L (ref 22–32)
Calcium: 10 mg/dL (ref 8.9–10.3)
Chloride: 101 mmol/L (ref 98–111)
Creatinine, Ser: 0.89 mg/dL (ref 0.61–1.24)
GFR calc Af Amer: >60 mL/min (ref >60)
GFR calc non Af Amer: >60 mL/min (ref >60)
Glucose, Bld: 208 mg/dL — ABNORMAL HIGH (ref 70–99)
Potassium: 5.3 mmol/L — ABNORMAL HIGH (ref 3.5–5.1)
Sodium: 135 mmol/L (ref 135–145)
Total Bilirubin: 0.4 mg/dL (ref 0.3–1.2)
Total Protein: 7.5 g/dL (ref 6.5–8.1)

## 2019-08-31 LAB — CBC WITH DIFFERENTIAL/PLATELET
Abs Immature Granulocytes: 0.02 10*3/uL (ref 0.00–0.07)
Basophils Absolute: 0 10*3/uL (ref 0.0–0.1)
Basophils Relative: 0 %
Eosinophils Absolute: 0 10*3/uL (ref 0.0–0.5)
Eosinophils Relative: 0 %
HCT: 52.5 % — ABNORMAL HIGH (ref 39.0–52.0)
Hemoglobin: 16.6 g/dL (ref 13.0–17.0)
Immature Granulocytes: 0 %
Lymphocytes Relative: 18 %
Lymphs Abs: 1.4 10*3/uL (ref 0.7–4.0)
MCH: 28.7 pg (ref 26.0–34.0)
MCHC: 31.6 g/dL (ref 30.0–36.0)
MCV: 90.8 fL (ref 80.0–100.0)
Monocytes Absolute: 0.9 10*3/uL (ref 0.1–1.0)
Monocytes Relative: 11 %
Neutro Abs: 5.7 10*3/uL (ref 1.7–7.7)
Neutrophils Relative %: 71 %
Platelets: 186 10*3/uL (ref 150–400)
RBC: 5.78 MIL/uL (ref 4.22–5.81)
RDW: 13.4 % (ref 11.5–15.5)
WBC: 8.1 10*3/uL (ref 4.0–10.5)
nRBC: 0 % (ref 0.0–0.2)

## 2019-08-31 LAB — C-REACTIVE PROTEIN: CRP: 1.3 mg/dL — ABNORMAL HIGH (ref <1.0)

## 2019-08-31 LAB — D-DIMER, QUANTITATIVE: D-Dimer, Quant: 0.41 ug/mL-FEU (ref 0.00–0.50)

## 2019-08-31 LAB — GLUCOSE, CAPILLARY
Glucose-Capillary: 111 mg/dL — ABNORMAL HIGH (ref 70–99)
Glucose-Capillary: 211 mg/dL — ABNORMAL HIGH (ref 70–99)
Glucose-Capillary: 206 mg/dL — ABNORMAL HIGH (ref 70–99)
Glucose-Capillary: 247 mg/dL — ABNORMAL HIGH (ref 70–99)

## 2019-08-31 LAB — FERRITIN: Ferritin: 101 ng/mL (ref 24–336)

## 2019-08-31 LAB — MAGNESIUM: Magnesium: 2 mg/dL (ref 1.7–2.4)

## 2019-08-31 LAB — PHOSPHORUS: Phosphorus: 3.3 mg/dL (ref 2.5–4.6)

## 2019-08-31 NOTE — Progress Notes (Signed)
Called the cell phone and home number listed for patient's wife with no answer and left a message at the home number.  Cell number may be the incorrect number based on the message received.    Spoke to patient's daughter and updated her on patient's status.  All questions answered and concerns addressed.  No acute distress present.  Patient voices no complaints.  Up ad lib.  Denies pain.  Demonstrates how to properly use the call bell.  See chart for nsg assessment.

## 2019-08-31 NOTE — Progress Notes (Addendum)
PROGRESS NOTE    Ian IvanSaleem Shober  ZOX:[id number] DOB: 03/20/1951 DOA: 08/28/2019 PCP: Jackie Plumsei-Bonsu, George, MD   Brief Narrative:  Ian Morton is a 68 y.o. male with medical history significant for COPD, coronary artery disease, hypertension, and type 2 diabetes mellitus, now presenting to the emergency department for evaluation of fevers, chills, cough, shortness of breath, and aches.  Patient reports that he developed general malaise and myalgias 4 days ago, noted fevers and chills around the same time, and then developed a cough and shortness of breath 2 days ago.  Cough and dyspnea has continued to worsen, patient continues to have fevers and aches, reports feeling progressively weak in general, was short of breath at rest today, and called EMS.  Patient was found to have a fever to 101.8 F with EMS and was treated with Tylenol prior to arrival in the ED.  Patient acknowledges having chest pain when asked, but explains that he is aching all over. He has had intermittent headaches but denies change in vision or hearing or focal numbness or weakness, and denies neck stiffness.  He does not know of any sick contacts. Upon arrival to the ED, patient is found to be afebrile after treatment with Tylenol by EMS, saturating 90% on room air, tachypneic, and with stable blood pressure.  EKG features a sinus rhythm and chest x-ray is negative for acute cardiopulmonary disease.  Chemistry panel and CBC are unremarkable.  Patient was given a liter of normal saline and Zofran in the emergency department, COVID-19 testing is in process, and hospitalists consulted for admission.   Assessment & Plan:   Principal Problem:   COVID-19 virus infection Active Problems:   Hypertension   Diabetes mellitus type II, non insulin dependent (HCC)   Coronary artery disease   SIRS (systemic inflammatory response syndrome) (HCC)   COPD (chronic obstructive pulmonary disease) (HCC)   COVID-19 virus detected   Acute metabolic  encephalopathy   Acute hypoxic respiratory failure in the setting of sepsis, COVID-19 infection POA  - Presents with fevers, cough, SOB, aches, and malaise, was febrile with EMS and treated with APAP pta, and found to be tachypneic at rest with clear CXR    -Patient remains hypoxic, as low as 84-85% on room air today - requires ongoing supplemental O2 to maintain sats >90% - currently on 2L [place]@ rest - O2 walk screen pending - Remdesivir tentative stop date 09/02/2019 - Continue dexamethasone per protocol Recent Labs    08/29/19 1530 08/30/19 0115 08/31/19 0030  DDIMER 0.51* 0.56* 0.41  FERRITIN  --  59 101  LDH  --  141  --   CRP 1.1* 1.0* 1.3*    Hyperkalemia, mild  -Follow with morning labs, questionably in the setting of hyperglycemia -Creatinine within normal limits, no potassium/supplement prescribed  Acute metabolic encephalopathy, secondary to above, resolved -Continue supportive care as above -Patient requesting we not contact family -Patient alert and oriented - likely at baseline now - somewhat hearing impaired causing some difficulty in communication  COPD, not in acute exacerbation  - No wheezing on admission  - Continue ICS/LABA and as needed albuterol    History of CAD  - Acknowledges pleuritic chest pain previously - now resolved, noted with deep inspiration, unlikely cardiac in etiology  -Troponin negative - No acute ischemic features on EKG  - Continue ASA, Plavix, beta-blocker, and ACE-i    Type II DM , non insulin dependent - A1c 6.8  - Managed with metformin at home, held on  admission  - Use low-intensity SSI with Novolog while in hospital   - Mild episode of hypoglycemia previously with poor p.o. intake in the setting of mental status changes - but this has resolved as above  Hypertension, essential, well-controlled  - BP at goal, continue metoprolol and lisinopril     DVT prophylaxis: Lovenox  Code Status: Full  Family Communication:  Discussed with patient -patient refusing further discussion with family at this time Consults called: None Admission status:  Continue as inpatient given ongoing hypoxia, need for IV steroids, IV Remdesivir and ongoing respiratory failure in the setting of COVID-19 infection.    Subjective: No acute issues or events overnight, complaining of generalized malaise, muscle pain most notably in his legs as well as some pain with deep inspiration over the anterior right chest wall.  Otherwise denies fevers, chills, nausea, vomiting, diarrhea, constipation.  Objective: Vitals:   08/30/19 1612 08/30/19 1932 08/31/19 0344 08/31/19 0714  BP: 117/81 110/69 (!) 99/49 94/63  Pulse: (!) 57 (!) 53 (!) 51 61  Resp: 18 16 18 16   Temp: 98 F (36.7 C) 97.6 F (36.4 C) 98.1 F (36.7 C) 98.1 F (36.7 C)  TempSrc: Oral Oral Oral Oral  SpO2: 99% 97% 93% 96%  Weight:      Height:        Intake/Output Summary (Last 24 hours) at 08/31/2019 0718 Last data filed at 08/31/2019 0300 Gross per 24 hour  Intake 501.91 ml  Output 651 ml  Net -149.09 ml   Filed Weights   08/29/19 0440  Weight: 87.2 kg    Examination: General:  Pleasantly resting in bed, No acute distress.  Awake and alert, oriented to person place and time.   HEENT:  Normocephalic atraumatic.  Sclerae nonicteric, noninjected.  Extraocular movements intact bilaterally. Neck:  Without mass or deformity.  Trachea is midline. Lungs: Coarse breath sounds bilaterally without overt rhonchi, wheeze, or rales. Heart:  Regular rate and rhythm.  Without murmurs, rubs, or gallops. Abdomen:  Soft, nontender, nondistended.  Without guarding or rebound. Extremities: Without cyanosis, clubbing, edema, or obvious deformity. Vascular:  Dorsalis pedis and posterior tibial pulses palpable bilaterally. Skin:  Warm and dry, no erythema, no ulcerations.   Data Reviewed: I have personally reviewed following labs and imaging studies  CBC: Recent Labs  Lab  08/28/19 2016 08/30/19 0115 08/31/19 0030  WBC 9.0 7.9 8.1  NEUTROABS 7.6 6.7 5.7  HGB 14.1 14.9 16.6  HCT 41.9 46.3 52.5*  MCV 89.5 90.6 90.8  PLT 180 165 458   Basic Metabolic Panel: Recent Labs  Lab 08/28/19 2016 08/29/19 1530 08/30/19 0115 08/31/19 0030  NA 135 135 136 135  K 3.9 4.1 4.7 5.3*  CL 102 100 99 101  CO2 23 28 29 25   GLUCOSE 109* 122* 183* 208*  BUN 16 15 18  28*  CREATININE 1.00 0.97 0.98 0.89  CALCIUM 8.8* 8.9 9.3 10.0  MG  --  1.8 2.0 2.0  PHOS  --  4.8* 4.1 3.3   GFR: Estimated Creatinine Clearance: 79.7 mL/min (by C-G formula based on SCr of 0.89 mg/dL). Liver Function Tests: Recent Labs  Lab 08/29/19 1530 08/30/19 0115 08/31/19 0030  AST 22 25 42*  ALT 21 24 43  ALKPHOS 73 76 88  BILITOT 0.3 0.5 0.4  PROT 6.3* 7.0 7.5  ALBUMIN 3.5 3.9 4.3   No results for input(s): LIPASE, AMYLASE in the last 168 hours. No results for input(s): AMMONIA in the last 168 hours. Coagulation  Profile: No results for input(s): INR, PROTIME in the last 168 hours. Cardiac Enzymes: No results for input(s): CKTOTAL, CKMB, CKMBINDEX, TROPONINI in the last 168 hours. BNP (last 3 results) No results for input(s): PROBNP in the last 8760 hours. HbA1C: Recent Labs    08/29/19 1530  HGBA1C 6.8*   CBG: Recent Labs  Lab 08/29/19 2056 08/30/19 0818 08/30/19 1248 08/30/19 1527 08/30/19 2143  GLUCAP 233* 101* 171* 235* 221*   Lipid Profile: No results for input(s): CHOL, HDL, LDLCALC, TRIG, CHOLHDL, LDLDIRECT in the last 72 hours. Thyroid Function Tests: No results for input(s): TSH, T4TOTAL, FREET4, T3FREE, THYROIDAB in the last 72 hours. Anemia Panel: Recent Labs    08/30/19 0115 08/31/19 0030  FERRITIN 59 101   Sepsis Labs: Recent Labs  Lab 08/29/19 1530  PROCALCITON <0.10    Recent Results (from the past 240 hour(s))  SARS Coronavirus 2 [facility] order, Performed in [facility] hospital lab) Nasopharyngeal Nasopharyngeal Swab     Status:  Abnormal   Collection Time: 08/28/19  8:13 PM   Specimen: Nasopharyngeal Swab  Result Value Ref Range Status   SARS Coronavirus 2 POSITIVE (A) NEGATIVE Final    Comment: RESULT CALLED TO, READ BACK BY AND VERIFIED WITH: L. VENEGAS,RN 2344 08/28/2019 T. TYSOR (NOTE) If result is NEGATIVE SARS-CoV-2 target nucleic acids are NOT DETECTED. The SARS-CoV-2 RNA is generally detectable in upper and lower  respiratory specimens during the acute phase of infection. The lowest  concentration of SARS-CoV-2 viral copies this assay can detect is 250  copies / mL. A negative result does not preclude SARS-CoV-2 infection  and should not be used as the sole basis for treatment or other  patient management decisions.  A negative result may occur with  improper specimen collection / handling, submission of specimen other  than nasopharyngeal swab, presence of viral mutation(s) within the  areas targeted by this assay, and inadequate number of viral copies  (<250 copies / mL). A negative result must be combined with clinical  observations, patient history, and epidemiological information. If result is POSITIVE SARS-CoV-2 target nucleic acids are DETECTED.  The SARS-CoV-2 RNA is generally detectable in upper and lower  respiratory specimens during the acute phase of infection.  Positive  results are indicative of active infection with SARS-CoV-2.  Clinical  correlation with patient history and other diagnostic information is  necessary to determine patient infection status.  Positive results do  not rule out bacterial infection or co-infection with other viruses. If result is PRESUMPTIVE POSTIVE SARS-CoV-2 nucleic acids MAY BE PRESENT.   A presumptive positive result was obtained on the submitted specimen  and confirmed on repeat testing.  While 2019 novel coronavirus  (SARS-CoV-2) nucleic acids may be present in the submitted sample  additional confirmatory testing may be necessary for epidemiological   and / or clinical management purposes  to differentiate between  SARS-CoV-2 and other Sarbecovirus currently known to infect humans.  If clinically indicated additional testing with an alternate test  methodology [phone])  is advised. The SARS-CoV-2 RNA is generally  detectable in upper and lower respiratory specimens during the acute  phase of infection. The expected result is Negative. Fact Sheet for Patients:  BoilerBrush.com.cy Fact Sheet for Healthcare Providers: https://pope.com/ This test is not yet approved or cleared by the [place] FDA and has been authorized for detection and/or diagnosis of SARS-CoV-2 by FDA under an Emergency Use Authorization (EUA).  This EUA will remain in effect (meaning this test can be  used) for the duration of the COVID-19 declaration under Section 564(b)(1) of the Act, 21 U.S.C. section 360bbb-3(b)(1), unless the authorization is terminated or revoked sooner. Performed at [facility], [address], [place], [place] [address]          Radiology Studies: No results found. Scheduled Meds: . acyclovir  400 mg Oral BID  . aspirin  325 mg Oral Daily  . atorvastatin  80 mg Oral q1800  . clopidogrel  75 mg Oral Daily  . dexamethasone (DECADRON) injection  6 mg Intravenous Daily  . DULoxetine  60 mg Oral BID  . enoxaparin (LOVENOX) injection  40 mg Subcutaneous Q24H  . insulin aspart  0-5 Units Subcutaneous QHS  . insulin aspart  0-9 Units Subcutaneous TID WC  . linaclotide  290 mcg Oral QAC breakfast  . lisinopril  10 mg Oral Daily  . metoprolol succinate  25 mg Oral Daily  . mometasone-formoterol  2 puff Inhalation BID  . risperiDONE  0.5 mg Oral BID  . sodium chloride flush  3 mL Intravenous Q12H  . sodium chloride flush  3 mL Intravenous Q12H  . tamsulosin  0.4 mg Oral QHS   Continuous Infusions: . sodium chloride    . remdesivir 100 mg in NS 250 mL Stopped (08/30/19 1707)      LOS: 2 days   Time spent: 35 min  Azucena Fallen, DO [facility]  If 7PM-7AM, please contact night-coverage [email or web address] Password [facility] 08/31/2019, 7:18 AM

## 2019-09-01 LAB — GLUCOSE, CAPILLARY
Glucose-Capillary: 175 mg/dL — ABNORMAL HIGH (ref 70–99)
Glucose-Capillary: 324 mg/dL — ABNORMAL HIGH (ref 70–99)
Glucose-Capillary: 320 mg/dL — ABNORMAL HIGH (ref 70–99)
Glucose-Capillary: 357 mg/dL — ABNORMAL HIGH (ref 70–99)

## 2019-09-01 NOTE — Progress Notes (Signed)
Patient resting in bed.  No acute distress present.  See chart for nsg assessment.   Patient states that he is recently estranged from wife and her family.  States that wife recently kicked him out of the house and he is living in an apartment.  States he does not want her called or her family called to be updated on his care.  Will respect his wishes and not make the call tonight per patient request.

## 2019-09-01 NOTE — Progress Notes (Signed)
Patient ambulated in room on RA saturation 95-100%. Pt refused offer to ambulate in hallway with this writer multiple times. States "he just wants to rest"

## 2019-09-01 NOTE — Progress Notes (Signed)
This writer left VM for both phone numbers listed in contact for days update.

## 2019-09-01 NOTE — Progress Notes (Signed)
PROGRESS NOTE    Ian Morton  IEP:[id number] DOB: 05/18/1951 DOA: 08/28/2019 PCP: Benito Mccreedy, MD   Brief Narrative:  Ian Morton is a 68 y.o. male with medical history significant for COPD, coronary artery disease, hypertension, and type 2 diabetes mellitus, now presenting to the emergency department for evaluation of fevers, chills, cough, shortness of breath, and aches.  Patient reports that he developed general malaise and myalgias 4 days ago, noted fevers and chills around the same time, and then developed a cough and shortness of breath 2 days ago.  Cough and dyspnea has continued to worsen, patient continues to have fevers and aches, reports feeling progressively weak in general, was short of breath at rest today, and called EMS.  Patient was found to have a fever to 101.8 F with EMS and was treated with Tylenol prior to arrival in the ED.  Patient acknowledges having chest pain when asked, but explains that he is aching all over. He has had intermittent headaches but denies change in vision or hearing or focal numbness or weakness, and denies neck stiffness.  He does not know of any sick contacts. Upon arrival to the ED, patient is found to be afebrile after treatment with Tylenol by EMS, saturating 90% on room air, tachypneic, and with stable blood pressure.  EKG features a sinus rhythm and chest x-ray is negative for acute cardiopulmonary disease.  Chemistry panel and CBC are unremarkable.  Patient was given a liter of normal saline and Zofran in the emergency department, COVID-19 testing is in process, and hospitalists consulted for admission.   Assessment & Plan:   Principal Problem:   COVID-19 virus infection Active Problems:   Hypertension   Diabetes mellitus type II, non insulin dependent (HCC)   Coronary artery disease   SIRS (systemic inflammatory response syndrome) (HCC)   COPD (chronic obstructive pulmonary disease) ([place])   COVID-19 virus detected   Acute metabolic  encephalopathy   Acute hypoxic respiratory failure in the setting of sepsis, COVID-19 infection POA, resolving - Presents with fevers, cough, SOB, aches, and malaise, was febrile with EMS and treated with APAP pta, and found to be tachypneic at rest with clear CXR    -Patient remains hypoxic with exertion - now able to tolerate RA and remain in low 90s. - O2 walk screen pending - Remdesivir tentative stop date 09/02/2019 - Continue dexamethasone per protocol -Covid labs q48h per patient request Recent Labs    08/29/19 1530 08/30/19 0115 08/31/19 0030  DDIMER 0.51* 0.56* 0.41  FERRITIN  --  59 101  LDH  --  141  --   CRP 1.1* 1.0* 1.3*    Hyperkalemia, mild  -Follow with morning labs, questionably in the setting of hyperglycemia -Creatinine within normal limits, no potassium/supplement prescribed  Acute metabolic encephalopathy, secondary to above, resolved -Continue supportive care as above -Patient requesting we not contact family -Patient alert and oriented - likely at baseline now - somewhat hearing impaired causing some difficulty in communication  COPD, not in acute exacerbation  - No wheezing on admission  - Continue ICS/LABA and as needed albuterol    History of CAD  - Acknowledges pleuritic chest pain previously - now resolved, noted with deep inspiration, unlikely cardiac in etiology  -Troponin negative - No acute ischemic features on EKG at admission - Continue ASA, Plavix, beta-blocker, and ACE-i    Type II DM , non insulin dependent - A1c 6.8  - Managed with metformin at home, held on admission  -  Use low-intensity SSI with Novolog while in hospital   - Mild episode of hypoglycemia previously with poor p.o. intake in the setting of mental status changes - but this has resolved as above  Hypertension, essential, well-controlled  - BP at goal, continue metoprolol and lisinopril     DVT prophylaxis: Lovenox  Code Status: Full  Family Communication:  Discussed with patient -patient refusing further discussion with family at this time Consults called: None Admission status:  Continue as inpatient given ongoing hypoxia, need for IV steroids, IV Remdesivir and ongoing respiratory failure in the setting of COVID-19 infection.    Subjective: No acute issues or events overnight, complaining of generalized malaise, muscle pain most notably in his legs as well as some pain with deep inspiration over the anterior right chest wall.  Otherwise denies fevers, chills, nausea, vomiting, diarrhea, constipation.  Objective: Vitals:   08/31/19 1245 08/31/19 1558 08/31/19 2000 09/01/19 0622  BP: 102/77 102/67 113/70 117/65  Pulse: 73 (!) 59  60  Resp:  18 18 15   Temp:  97.9 F (36.6 C) 97.8 F (36.6 C) 97.9 F (36.6 C)  TempSrc:  Oral Oral Oral  SpO2:  98% 98% 100%  Weight:      Height:        Intake/Output Summary (Last 24 hours) at 09/01/2019 0741 Last data filed at 09/01/2019 0100 Gross per 24 hour  Intake 360 ml  Output 600 ml  Net -240 ml   Filed Weights   08/29/19 0440  Weight: 87.2 kg    Examination: General:  Pleasantly resting in bed, No acute distress.  Awake and alert, oriented to person place and time.   HEENT:  Normocephalic atraumatic.  Sclerae nonicteric, noninjected.  Extraocular movements intact bilaterally. Neck:  Without mass or deformity.  Trachea is midline. Lungs: Coarse breath sounds bilaterally without overt rhonchi, wheeze, or rales. Heart:  Regular rate and rhythm.  Without murmurs, rubs, or gallops. Abdomen:  Soft, nontender, nondistended.  Without guarding or rebound. Extremities: Without cyanosis, clubbing, edema, or obvious deformity. Vascular:  Dorsalis pedis and posterior tibial pulses palpable bilaterally. Skin:  Warm and dry, no erythema, no ulcerations.   Data Reviewed: I have personally reviewed following labs and imaging studies  CBC: Recent Labs  Lab 08/28/19 2016 08/30/19 0115 08/31/19  0030  WBC 9.0 7.9 8.1  NEUTROABS 7.6 6.7 5.7  HGB 14.1 14.9 16.6  HCT 41.9 46.3 52.5*  MCV 89.5 90.6 90.8  PLT 180 165 186   Basic Metabolic Panel: Recent Labs  Lab 08/28/19 2016 08/29/19 1530 08/30/19 0115 08/31/19 0030  NA 135 135 136 135  K 3.9 4.1 4.7 5.3*  CL 102 100 99 101  CO2 23 28 29 25   GLUCOSE 109* 122* 183* 208*  BUN 16 15 18  28*  CREATININE 1.00 0.97 0.98 0.89  CALCIUM 8.8* 8.9 9.3 10.0  MG  --  1.8 2.0 2.0  PHOS  --  4.8* 4.1 3.3   GFR: Estimated Creatinine Clearance: 79.7 mL/min (by C-G formula based on SCr of 0.89 mg/dL). Liver Function Tests: Recent Labs  Lab 08/29/19 1530 08/30/19 0115 08/31/19 0030  AST 22 25 42*  ALT 21 24 43  ALKPHOS 73 76 88  BILITOT 0.3 0.5 0.4  PROT 6.3* 7.0 7.5  ALBUMIN 3.5 3.9 4.3   No results for input(s): LIPASE, AMYLASE in the last 168 hours. No results for input(s): AMMONIA in the last 168 hours. Coagulation Profile: No results for input(s): INR, PROTIME in the  last 168 hours. Cardiac Enzymes: No results for input(s): CKTOTAL, CKMB, CKMBINDEX, TROPONINI in the last 168 hours. BNP (last 3 results) No results for input(s): PROBNP in the last 8760 hours. HbA1C: Recent Labs    08/29/19 1530  HGBA1C 6.8*   CBG: Recent Labs  Lab 08/30/19 2143 08/31/19 0754 08/31/19 1149 08/31/19 1602 08/31/19 2041  GLUCAP 221* 111* 211* 206* 247*   Lipid Profile: No results for input(s): CHOL, HDL, LDLCALC, TRIG, CHOLHDL, LDLDIRECT in the last 72 hours. Thyroid Function Tests: No results for input(s): TSH, T4TOTAL, FREET4, T3FREE, THYROIDAB in the last 72 hours. Anemia Panel: Recent Labs    08/30/19 0115 08/31/19 0030  FERRITIN 59 101   Sepsis Labs: Recent Labs  Lab 08/29/19 1530  PROCALCITON <0.10    Recent Results (from the past 240 hour(s))  SARS Coronavirus 2 [facility](Hospital order, [facility]) Nasopharyngeal Nasopharyngeal Swab     Status: Abnormal   Collection Time: 08/28/19  8:13  PM   Specimen: Nasopharyngeal Swab  Result Value Ref Range Status   SARS Coronavirus 2 POSITIVE (A) NEGATIVE Final    Comment: RESULT CALLED TO, READ BACK BY AND VERIFIED WITH: L. VENEGAS,RN 2344 08/28/2019 T. TYSOR (NOTE) If result is NEGATIVE SARS-CoV-2 target nucleic acids are NOT DETECTED. The SARS-CoV-2 RNA is generally detectable in upper and lower  respiratory specimens during the acute phase of infection. The lowest  concentration of SARS-CoV-2 viral copies this assay can detect is 250  copies / mL. A negative result does not preclude SARS-CoV-2 infection  and should not be used as the sole basis for treatment or other  patient management decisions.  A negative result may occur with  improper specimen collection / handling, submission of specimen other  than nasopharyngeal swab, presence of viral mutation(s) within the  areas targeted by this assay, and inadequate number of viral copies  (<250 copies / mL). A negative result must be combined with clinical  observations, patient history, and epidemiological information. If result is POSITIVE SARS-CoV-2 target nucleic acids are DETECTED.  The SARS-CoV-2 RNA is generally detectable in upper and lower  respiratory specimens during the acute phase of infection.  Positive  results are indicative of active infection with SARS-CoV-2.  Clinical  correlation with patient history and other diagnostic information is  necessary to determine patient infection status.  Positive results do  not rule out bacterial infection or co-infection with other viruses. If result is PRESUMPTIVE POSTIVE SARS-CoV-2 nucleic acids MAY BE PRESENT.   A presumptive positive result was obtained on the submitted specimen  and confirmed on repeat testing.  While 2019 novel coronavirus  (SARS-CoV-2) nucleic acids may be present in the submitted sample  additional confirmatory testing may be necessary for epidemiological  and / or clinical management purposes  to  differentiate between  SARS-CoV-2 and other Sarbecovirus currently known to infect humans.  If clinically indicated additional testing with an alternate test  methodology [phone]([id number])  is advised. The SARS-CoV-2 RNA is generally  detectable in upper and lower respiratory specimens during the acute  phase of infection. The expected result is Negative. Fact Sheet for Patients:  BoilerBrush.com.cyhttps://www.fda.gov/media/136312/download Fact Sheet for Healthcare Providers: https://pope.com/https://www.fda.gov/media/136313/download This test is not yet approved or cleared by the [place] FDA and has been authorized for detection and/or diagnosis of SARS-CoV-2 by FDA under an Emergency Use Authorization (EUA).  This EUA will remain in effect (meaning this test can be used) for the duration of the COVID-19 declaration under  Section 564(b)(1) of the Act, 21 U.S.C. section 360bbb-3(b)(1), unless the authorization is terminated or revoked sooner. Performed at [facility], [address] [place], [place] [address]          Radiology Studies: No results found. Scheduled Meds: . acyclovir  400 mg Oral BID  . aspirin  325 mg Oral Daily  . atorvastatin  80 mg Oral q1800  . clopidogrel  75 mg Oral Daily  . dexamethasone (DECADRON) injection  6 mg Intravenous Daily  . DULoxetine  60 mg Oral BID  . enoxaparin (LOVENOX) injection  40 mg Subcutaneous Q24H  . insulin aspart  0-5 Units Subcutaneous QHS  . insulin aspart  0-9 Units Subcutaneous TID WC  . linaclotide  290 mcg Oral QAC breakfast  . lisinopril  10 mg Oral Daily  . metoprolol succinate  25 mg Oral Daily  . mometasone-formoterol  2 puff Inhalation BID  . risperiDONE  0.5 mg Oral BID  . sodium chloride flush  3 mL Intravenous Q12H  . sodium chloride flush  3 mL Intravenous Q12H  . tamsulosin  0.4 mg Oral QHS   Continuous Infusions: . sodium chloride    . remdesivir 100 mg in NS 250 mL 100 mg (08/31/19 1658)     LOS: 3 days   Time spent: 35 min   Azucena Fallen, DO [facility]  If 7PM-7AM, please contact night-coverage [email or web address] Password [facility] 09/01/2019, 7:41 AM

## 2019-09-02 LAB — GLUCOSE, CAPILLARY
Glucose-Capillary: 185 mg/dL — ABNORMAL HIGH (ref 70–99)
Glucose-Capillary: 223 mg/dL — ABNORMAL HIGH (ref 70–99)
Glucose-Capillary: 352 mg/dL — ABNORMAL HIGH (ref 70–99)

## 2019-09-02 MED ORDER — PREDNISONE 10 MG PO TABS: ORAL_TABLET | ORAL | 0 refills | Status: DC

## 2019-09-02 MED ORDER — WITCH HAZEL-GLYCERIN EX PADS
MEDICATED_PAD | CUTANEOUS | Status: DC | PRN
  Filled 2019-09-02: qty 100

## 2019-09-02 NOTE — Progress Notes (Signed)
SATURATION QUALIFICATIONS: (This note is used to comply with regulatory documentation for home oxygen)  Patient Saturations on Room Air at Rest = 96%  Patient Saturations on Room Air while Ambulating = 88 %  Patient Saturations on 2 Liters of oxygen while Ambulating = 94%  Please briefly explain why patient needs home oxygen: Pt O2 sats unable to maintain above 90% while ambulating.

## 2019-09-02 NOTE — Progress Notes (Signed)
Inpatient Diabetes Program Recommendations  AACE/ADA: New Consensus Statement on Inpatient Glycemic Control (2015)  Target Ranges:  Prepandial:   less than 140 mg/dL      Peak postprandial:   less than 180 mg/dL (1-2 hours)      Critically ill patients:  140 - 180 mg/dL   Lab Results  Component Value Date   GLUCAP 185 (H) 09/02/2019   HGBA1C 6.8 (H) 08/29/2019    Review of Glycemic Control Results for SHERMAINE, BRIGHAM (MRN [id number]) as of 09/02/2019 11:06  Ref. Range 09/01/2019 11:09 09/01/2019 15:16 09/01/2019 20:32 09/02/2019 07:28  Glucose-Capillary Latest Ref Range: 70 - 99 mg/dL 324 (H) 320 (H) 357 (H) 185 (H)   Diabetes history: Type 2 DM Outpatient Diabetes medications: Glipizide 2.5 mg QAM, Metformin 1000 mg BID Current orders for Inpatient glycemic control: Novolog 0-9 units TID, Novolog 0-5 units QHS Decadron 6 mg QD  Inpatient Diabetes Program Recommendations:    Postprandials exceeding 300's mg/dL. While in the setting of steroids, consider adding Novolog 3 units TID (assuming patient is consuming >50 of meal and to be tapered with discontinuation of steroids. Of note, patient was on Glipizide at home.    Thanks, Bronson Curb, MSN, RNC-OB Diabetes Coordinator [phone] (8a-5p)

## 2019-09-02 NOTE — Discharge Summary (Addendum)
Physician Discharge Summary  Ericberto Polachek MGQ:[id number] DOB: 02-18-1951 DOA: 08/28/2019  PCP: Jackie Plum, MD  Admit date: 08/28/2019 Discharge date: 09/02/2019  Admitted From: Home Disposition: Home  Recommendations for Outpatient Follow-up:  1. Follow up with PCP in 1-2 weeks 2. Please obtain BMP/CBC in one week  Discharge Condition: Stable CODE STATUS: Full Diet recommendation: As tolerated  Brief/Interim Summary: Ian Morton a 68 y.o.malewith medical history significant forCOPD, coronary artery disease, hypertension, and type 2 diabetes mellitus, now presenting to the emergency department for evaluation of fevers, chills, cough, shortness of breath, and aches. Patient reports that he developed general malaise and myalgias 4 days ago, noted fevers and chills around the same time, and then developed a cough and shortness of breath 2 days ago. Cough and dyspnea has continued to worsen, patient continues to have fevers and aches, reports feeling progressively weak in general, was short of breath at rest today, and called EMS. Patient was found to have a fever to 101.8 Fwith EMS and was treated with Tylenol prior to arrival in the ED. Patient acknowledges havingchest pain when asked, butexplains that he is aching all over. He has had intermittent headaches but denies change in vision or hearing or focal numbness or weakness, and denies neck stiffness.He does not know of any sick contacts. Upon arrival to the ED, patient is found to be afebrile after treatment with Tylenol by EMS, saturating 90% on room air, tachypneic, and with stable blood pressure. EKG features a sinus rhythm and chest x-ray is negative for acute cardiopulmonary disease. Chemistry panel and CBC are unremarkable. Patient was given a liter of normal saline and Zofran in the emergency department, COVID-19 testing is in process, and hospitalistsconsulted for admission.  Patient admitted as above with acute  hypoxic respiratory failure in the setting of COVID-19 pneumonia as acute metabolic encephalopathy.  Patient's mental status improved quite rapidly over the first 24 hours of admission with improvement in hypoxia and symptoms.  Patient remained markedly hypoxic over the past week, currently able to be weaned off of nasal cannula at rest yesterday and now able to ambulate with 2 L nasal cannula to maintain sats in the low to mid 90s.  Patient indicates he feels remarkably better and is otherwise stable and agreeable for discharge home.  Patient does have known history of COPD, CAD and diabetes.  His chronic comorbid conditions were quite stable, in the setting of aggressive steroid use with COVID-19 pneumonia patient's glucose was somewhat elevated.  Patient's A1c is 6.8, he discussion about need for medication compliance and dietary compliance.  Patient will need close follow-up with PCP to ensure appropriate weaning of oxygen. At this time patient otherwise stable and agreeable for discharge home pending delivery of oxygen to his home.  Discharge Diagnoses:  Principal Problem:   COVID-19 virus infection Active Problems:   Hypertension   Diabetes mellitus type II, non insulin dependent (HCC)   Coronary artery disease   SIRS (systemic inflammatory response syndrome) (HCC)   COPD (chronic obstructive pulmonary disease) (HCC)   COVID-19 virus detected   Acute metabolic encephalopathy  Discharge Instructions  Discharge Instructions    Call MD for:  difficulty breathing, headache or visual disturbances   Complete by: As directed    Call MD for:  extreme fatigue   Complete by: As directed    Call MD for:  hives   Complete by: As directed    Call MD for:  persistant dizziness or light-headedness   Complete by: As  directed    Call MD for:  persistant nausea and vomiting   Complete by: As directed    Call MD for:  severe uncontrolled pain   Complete by: As directed    Call MD for:  temperature  >100.4   Complete by: As directed    Diet - low sodium heart healthy   Complete by: As directed    Increase activity slowly   Complete by: As directed      Allergies as of 09/02/2019      Reactions   Tramadol Nausea Only      Medication List    TAKE these medications   acyclovir 400 MG tablet Commonly known as: ZOVIRAX Take 400 mg by mouth 2 (two) times daily. For 10 days   ammonium lactate 12 % cream Commonly known as: AMLACTIN APPLY TO AFFECTED AREA AS NEEDED FOR DRY SKIN   aspirin 325 MG EC tablet Take 1 tablet (325 mg total) by mouth daily.   atorvastatin 80 MG tablet Commonly known as: LIPITOR Take 1 tablet (80 mg total) by mouth daily at 6 PM. What changed: how much to take   busPIRone 10 MG tablet Commonly known as: BUSPAR Take 10 mg by mouth 2 (two) times daily.   cholecalciferol 1000 units tablet Commonly known as: VITAMIN D Take 1,000 Units by mouth daily.   clopidogrel 75 MG tablet Commonly known as: PLAVIX Take 75 mg by mouth daily.   clotrimazole-betamethasone cream Commonly known as: LOTRISONE Apply 1 application topically 2 (two) times daily as needed.   dicyclomine 20 MG tablet Commonly known as: BENTYL Take 20 mg by mouth 3 (three) times daily.   DULoxetine 60 MG capsule Commonly known as: CYMBALTA Take 60 mg by mouth 2 (two) times daily.   fluticasone 50 MCG/ACT nasal spray Commonly known as: FLONASE Place 2 sprays into both nostrils daily.   Fluticasone-Salmeterol 500-50 MCG/DOSE Aepb Commonly known as: ADVAIR Inhale 1 puff into the lungs 2 (two) times daily. What changed: Another medication with the same name was removed. Continue taking this medication, and follow the directions you see here.   glipiZIDE 2.5 MG 24 hr tablet Commonly known as: GLUCOTROL XL Take 2.5 mg by mouth daily with breakfast.   HYDROcodone-acetaminophen 5-325 MG tablet Commonly known as: NORCO/VICODIN Take 1 tablet by mouth every 6 (six) hours as needed  for pain.   linaclotide 290 MCG Caps capsule Commonly known as: LINZESS Take 290 mcg by mouth daily.   lisinopril 10 MG tablet Commonly known as: ZESTRIL Take 1 tablet (10 mg total) by mouth daily. What changed: how much to take   loratadine 10 MG tablet Commonly known as: CLARITIN Take 10 mg by mouth daily.   Magnesium 250 MG Tabs Take 3 tablets by mouth at bedtime.   meclizine 25 MG tablet Commonly known as: ANTIVERT Take 25 mg by mouth 3 (three) times daily as needed for dizziness.   metFORMIN 500 MG tablet Commonly known as: GLUCOPHAGE Take 1,000 mg by mouth 2 (two) times daily with a meal. Take 1/2 tablet   metoprolol succinate 25 MG 24 hr tablet Commonly known as: TOPROL-XL Take 25 mg by mouth daily.   montelukast 10 MG tablet Commonly known as: SINGULAIR Take 10 mg by mouth at bedtime.   nitroGLYCERIN 0.4 MG SL tablet Commonly known as: NITROSTAT Place 0.4 mg under the tongue every 5 (five) minutes as needed for chest pain. Up to 3 times-see dr. If no relief   omeprazole  40 MG capsule Commonly known as: PRILOSEC Take 40 mg by mouth daily.   polyethylene glycol 17 g packet Commonly known as: MIRALAX / GLYCOLAX Take 17 g by mouth daily. Until normal bowel movements   pramoxine 1 % foam Commonly known as: Proctofoam Place 1 application rectally 3 (three) times daily as needed for anal itching.   predniSONE 10 MG tablet Commonly known as: DELTASONE Take 4 tablets (40 mg total) by mouth daily for 3 days, THEN 3 tablets (30 mg total) daily for 3 days, THEN 2 tablets (20 mg total) daily for 3 days, THEN 1 tablet (10 mg total) daily for 3 days. Start taking on: September 02, 2019   pregabalin 100 MG capsule Commonly known as: LYRICA Take 100 mg by mouth 3 (three) times daily.   albuterol (5 MG/ML) 0.5% nebulizer solution Commonly known as: PROVENTIL Take 2.5 mg by nebulization every 6 (six) hours as needed for wheezing or shortness of breath.   albuterol  (2.5 MG/3ML) 0.083% nebulizer solution Commonly known as: PROVENTIL Take 2.5 mg by nebulization every 6 (six) hours as needed for wheezing or shortness of breath.   ProAir HFA 108 (90 Base) MCG/ACT inhaler Generic drug: albuterol Inhale 2 puffs into the lungs every 6 (six) hours as needed.   risperiDONE 0.5 MG tablet Commonly known as: RISPERDAL Take 0.5 mg by mouth 2 (two) times daily.   tamsulosin 0.4 MG Caps capsule Commonly known as: FLOMAX Take 0.4 mg by mouth at bedtime.   triamcinolone cream 0.1 % Commonly known as: KENALOG Apply 1 application topically 2 (two) times daily.   Vitamin B Complex-C Caps Frequency:Daily   Dosage:1     Instructions:  Note:TAKE 1 CAPSULE DAILY.            Durable Medical Equipment  (From admission, onward)         Start     Ordered   09/02/19 1452  DME Oxygen  Once    Question Answer Comment  Length of Need 6 Months   Mode or (Route) Nasal cannula   Liters per Minute 2   Frequency Continuous (stationary and portable oxygen unit needed)   Oxygen delivery system Gas      09/02/19 1451          Allergies  Allergen Reactions  . Tramadol Nausea Only   Procedures/Studies: Ct Angio Chest Pe W And/or Wo Contrast  Result Date: 08/28/2019 CLINICAL DATA:  Chest pain, shortness of breath EXAM: CT ANGIOGRAPHY CHEST WITH CONTRAST TECHNIQUE: Multidetector CT imaging of the chest was performed using the standard protocol during bolus administration of intravenous contrast. Multiplanar CT image reconstructions and MIPs were obtained to evaluate the vascular anatomy. CONTRAST:  80mL OMNIPAQUE IOHEXOL 350 MG/ML SOLN COMPARISON:  05/31/2019 FINDINGS: Cardiovascular: No filling defects in the pulmonary arteries to suggest pulmonary emboli. Prior CABG. Heart is borderline in size. Scattered aortic atherosclerosis. No aneurysm. Mediastinum/Nodes: No mediastinal, hilar, or axillary adenopathy. Trachea and esophagus are unremarkable. Thyroid  unremarkable. Lungs/Pleura: Linear scarring in the lingula at the left base. No confluent opacities or effusions. Upper Abdomen: Imaging into the upper abdomen shows no acute findings. Musculoskeletal: Chest wall soft tissues are unremarkable. No acute bony abnormality. Review of the MIP images confirms the above findings. IMPRESSION: No evidence of pulmonary embolus. Prior CABG. Aortic Atherosclerosis (ICD10-I70.0). No acute cardiopulmonary disease. Electronically Signed   By: Charlett NoseKevin  Dover M.D.   On: 08/28/2019 23:18   Dg Chest Port 1 View  Result Date: 08/28/2019 CLINICAL  DATA:  Shortness of breath, cough and chest pain. EXAM: PORTABLE CHEST 1 VIEW COMPARISON:  07/05/2019 FINDINGS: Previous median sternotomy. Heart size is normal. Chronic aortic atherosclerosis. Chronic interstitial markings more prominent in the mid and lower lungs. No sign of active infiltrate, collapse or effusion. No acute finding. IMPRESSION: No active disease. Previous median sternotomy. Aortic atherosclerosis. Chronic interstitial lung markings more prominent in the lower lungs. Electronically Signed   By: Paulina Fusi M.D.   On: 08/28/2019 20:42    Subjective: No acute issues or events overnight, patient shortness of breath markedly improving, without dyspnea with ambulation, denies chest pain, nausea, vomiting, diarrhea, constipation, headache, fever, chills.   Discharge Exam: Vitals:   09/02/19 0348 09/02/19 0700  BP:  110/67  Pulse:    Resp:    Temp: 98.1 F (36.7 C) 97.6 F (36.4 C)  SpO2:     Vitals:   09/01/19 1518 09/01/19 2045 09/02/19 0348 09/02/19 0700  BP: 112/65   110/67  Pulse: 65     Resp: 18     Temp: 98.2 F (36.8 C) 98.2 F (36.8 C) 98.1 F (36.7 C) 97.6 F (36.4 C)  TempSrc: Oral Oral Oral Oral  SpO2: 100%     Weight:      Height:        General:  Pleasantly resting in bed, No acute distress. HEENT:  Normocephalic atraumatic.  Sclerae nonicteric, noninjected.  Extraocular movements  intact bilaterally. Neck:  Without mass or deformity.  Trachea is midline. Lungs:  Clear to auscultate bilaterally without rhonchi, wheeze, or rales. Heart:  Regular rate and rhythm.  Without murmurs, rubs, or gallops. Abdomen:  Soft, nontender, nondistended.  Without guarding or rebound. Extremities: Without cyanosis, clubbing, edema, or obvious deformity. Vascular:  Dorsalis pedis and posterior tibial pulses palpable bilaterally. Skin:  Warm and dry, no erythema, no ulcerations.   The results of significant diagnostics from this hospitalization (including imaging, microbiology, ancillary and laboratory) are listed below for reference.     Microbiology: Recent Results (from the past 240 hour(s))  SARS Coronavirus 2 [facility] order, Performed in [facility] hospital lab) Nasopharyngeal Nasopharyngeal Swab     Status: Abnormal   Collection Time: 08/28/19  8:13 PM   Specimen: Nasopharyngeal Swab  Result Value Ref Range Status   SARS Coronavirus 2 POSITIVE (A) NEGATIVE Final    Comment: RESULT CALLED TO, READ BACK BY AND VERIFIED WITH: L. VENEGAS,RN 2344 08/28/2019 T. TYSOR (NOTE) If result is NEGATIVE SARS-CoV-2 target nucleic acids are NOT DETECTED. The SARS-CoV-2 RNA is generally detectable in upper and lower  respiratory specimens during the acute phase of infection. The lowest  concentration of SARS-CoV-2 viral copies this assay can detect is 250  copies / mL. A negative result does not preclude SARS-CoV-2 infection  and should not be used as the sole basis for treatment or other  patient management decisions.  A negative result may occur with  improper specimen collection / handling, submission of specimen other  than nasopharyngeal swab, presence of viral mutation(s) within the  areas targeted by this assay, and inadequate number of viral copies  (<250 copies / mL). A negative result must be combined with clinical  observations, patient history, and epidemiological  information. If result is POSITIVE SARS-CoV-2 target nucleic acids are DETECTED.  The SARS-CoV-2 RNA is generally detectable in upper and lower  respiratory specimens during the acute phase of infection.  Positive  results are indicative of active infection with SARS-CoV-2.  Clinical  correlation  with patient history and other diagnostic information is  necessary to determine patient infection status.  Positive results do  not rule out bacterial infection or co-infection with other viruses. If result is PRESUMPTIVE POSTIVE SARS-CoV-2 nucleic acids MAY BE PRESENT.   A presumptive positive result was obtained on the submitted specimen  and confirmed on repeat testing.  While 2019 novel coronavirus  (SARS-CoV-2) nucleic acids may be present in the submitted sample  additional confirmatory testing may be necessary for epidemiological  and / or clinical management purposes  to differentiate between  SARS-CoV-2 and other Sarbecovirus currently known to infect humans.  If clinically indicated additional testing with an alternate test  methodology [phone])  is advised. The SARS-CoV-2 RNA is generally  detectable in upper and lower respiratory specimens during the acute  phase of infection. The expected result is Negative. Fact Sheet for Patients:  StrictlyIdeas.no Fact Sheet for Healthcare Providers: BankingDealers.co.za This test is not yet approved or cleared by the [place] FDA and has been authorized for detection and/or diagnosis of SARS-CoV-2 by FDA under an Emergency Use Authorization (EUA).  This EUA will remain in effect (meaning this test can be used) for the duration of the COVID-19 declaration under Section 564(b)(1) of the Act, 21 U.S.C. section 360bbb-3(b)(1), unless the authorization is terminated or revoked sooner. Performed at [facility], [place] [address] [place], [place] [address]      Labs: BNP (last 3  results) Recent Labs    08/28/19 2243  BNP 96.7   Basic Metabolic Panel: Recent Labs  Lab 08/28/19 2016 08/29/19 1530 08/30/19 0115 08/31/19 0030  NA 135 135 136 135  K 3.9 4.1 4.7 5.3*  CL 102 100 99 101  CO2 23 28 29 25   GLUCOSE 109* 122* 183* 208*  BUN 16 15 18  28*  CREATININE 1.00 0.97 0.98 0.89  CALCIUM 8.8* 8.9 9.3 10.0  MG  --  1.8 2.0 2.0  PHOS  --  4.8* 4.1 3.3   Liver Function Tests: Recent Labs  Lab 08/29/19 1530 08/30/19 0115 08/31/19 0030  AST 22 25 42*  ALT 21 24 43  ALKPHOS 73 76 88  BILITOT 0.3 0.5 0.4  PROT 6.3* 7.0 7.5  ALBUMIN 3.5 3.9 4.3   No results for input(s): LIPASE, AMYLASE in the last 168 hours. No results for input(s): AMMONIA in the last 168 hours. CBC: Recent Labs  Lab 08/28/19 2016 08/30/19 0115 08/31/19 0030  WBC 9.0 7.9 8.1  NEUTROABS 7.6 6.7 5.7  HGB 14.1 14.9 16.6  HCT 41.9 46.3 52.5*  MCV 89.5 90.6 90.8  PLT 180 165 186   Cardiac Enzymes: No results for input(s): CKTOTAL, CKMB, CKMBINDEX, TROPONINI in the last 168 hours. BNP: Invalid input(s): POCBNP CBG: Recent Labs  Lab 09/01/19 1109 09/01/19 1516 09/01/19 2032 09/02/19 0728 09/02/19 1139  GLUCAP 324* 320* 357* 185* 223*   D-Dimer Recent Labs    08/31/19 0030  DDIMER 0.41   Hgb A1c No results for input(s): HGBA1C in the last 72 hours. Lipid Profile No results for input(s): CHOL, HDL, LDLCALC, TRIG, CHOLHDL, LDLDIRECT in the last 72 hours. Thyroid function studies No results for input(s): TSH, T4TOTAL, T3FREE, THYROIDAB in the last 72 hours.  Invalid input(s): FREET3 Anemia work up Recent Labs    08/31/19 0030  FERRITIN 101   Urinalysis    Component Value Date/Time   COLORURINE STRAW (A) 04/17/2018 [place] 04/17/2018 1348   LABSPEC 1.008 04/17/2018 1348  PHURINE 8.0 04/17/2018 1348   GLUCOSEU NEGATIVE 04/17/2018 1348   HGBUR NEGATIVE 04/17/2018 1348   BILIRUBINUR NEGATIVE 04/17/2018 1348   KETONESUR NEGATIVE  04/17/2018 1348   PROTEINUR NEGATIVE 04/17/2018 1348   UROBILINOGEN 0.2 06/10/2014 0027   NITRITE NEGATIVE 04/17/2018 1348   LEUKOCYTESUR NEGATIVE 04/17/2018 1348   Sepsis Labs Invalid input(s): PROCALCITONIN,  WBC,  LACTICIDVEN Microbiology Recent Results (from the past 240 hour(s))  SARS Coronavirus 2 [facility](Hospital order, Performed in [facility] lab) Nasopharyngeal Nasopharyngeal Swab     Status: Abnormal   Collection Time: 08/28/19  8:13 PM   Specimen: Nasopharyngeal Swab  Result Value Ref Range Status   SARS Coronavirus 2 POSITIVE (A) NEGATIVE Final    Comment: RESULT CALLED TO, READ BACK BY AND VERIFIED WITH: L. VENEGAS,RN 2344 08/28/2019 T. TYSOR (NOTE) If result is NEGATIVE SARS-CoV-2 target nucleic acids are NOT DETECTED. The SARS-CoV-2 RNA is generally detectable in upper and lower  respiratory specimens during the acute phase of infection. The lowest  concentration of SARS-CoV-2 viral copies this assay can detect is 250  copies / mL. A negative result does not preclude SARS-CoV-2 infection  and should not be used as the sole basis for treatment or other  patient management decisions.  A negative result may occur with  improper specimen collection / handling, submission of specimen other  than nasopharyngeal swab, presence of viral mutation(s) within the  areas targeted by this assay, and inadequate number of viral copies  (<250 copies / mL). A negative result must be combined with clinical  observations, patient history, and epidemiological information. If result is POSITIVE SARS-CoV-2 target nucleic acids are DETECTED.  The SARS-CoV-2 RNA is generally detectable in upper and lower  respiratory specimens during the acute phase of infection.  Positive  results are indicative of active infection with SARS-CoV-2.  Clinical  correlation with patient history and other diagnostic information is  necessary to determine patient infection status.  Positive results do   not rule out bacterial infection or co-infection with other viruses. If result is PRESUMPTIVE POSTIVE SARS-CoV-2 nucleic acids MAY BE PRESENT.   A presumptive positive result was obtained on the submitted specimen  and confirmed on repeat testing.  While 2019 novel coronavirus  (SARS-CoV-2) nucleic acids may be present in the submitted sample  additional confirmatory testing may be necessary for epidemiological  and / or clinical management purposes  to differentiate between  SARS-CoV-2 and other Sarbecovirus currently known to infect humans.  If clinically indicated additional testing with an alternate test  methodology [phone]([id number])  is advised. The SARS-CoV-2 RNA is generally  detectable in upper and lower respiratory specimens during the acute  phase of infection. The expected result is Negative. Fact Sheet for Patients:  BoilerBrush.com.cyhttps://www.fda.gov/media/136312/download Fact Sheet for Healthcare Providers: https://pope.com/https://www.fda.gov/media/136313/download This test is not yet approved or cleared by the [place] FDA and has been authorized for detection and/or diagnosis of SARS-CoV-2 by FDA under an Emergency Use Authorization (EUA).  This EUA will remain in effect (meaning this test can be used) for the duration of the COVID-19 declaration under Section 564(b)(1) of the Act, 21 U.S.C. section 360bbb-3(b)(1), unless the authorization is terminated or revoked sooner. Performed at [facility], [address] [place], [place] [address]      Time coordinating discharge: Over 30 minutes  SIGNED:   Azucena FallenWilliam C Seriyah Collison, DO [facility] 09/02/2019, 2:52 PM Pager   If 7PM-7AM, please contact night-coverage [email or web address] Password [email or web address]

## 2019-09-02 NOTE — TOC Transition Note (Signed)
Transition of Care [facility]) - CM/SW Discharge Note   Patient Details  Name: Ian Morton MRN: [id number] Date of Birth: [birth year]  Transition of [facility]) CM/SW Contact:  Ninfa Meeker, RN Phone Number: [phone] (working remotely) 09/02/2019, 7:24 PM   Clinical Narrative:  68 yr old gentleman admitted and treated for COVID 19. Thankfully patient is improving and will discharge home. Case Manager attempted to arrange [facility] services for patient and had difficulty finding an age. Finally Patient was accepted by [facility]. They will service patient. Patient is estranged from his wife and family at this time and he is living in his own apartment, [address], [place]. There is no one there to receive oxygen, Case manager arranged with Learta Codding with [facility] to have oxygen delivery coincide with patient's arrival home via [place]. [facility] has agreed to transport patient home with a portable tank from [employer]. Thankful for so much team work from charge nurse Jenny Reichmann and bedside RN Jarrett Soho, along with Huey Romans and [facility] to make this discharge come together for patient.    Final next level of care: Home w Home Health Services Barriers to Discharge: Barriers Resolved   Patient Goals and CMS Choice   CMS Medicare.gov Compare Post Acute Care list provided to:: Patient Choice offered to / list presented to : Patient  Discharge Placement                       Discharge Plan and Services In-house Referral: NA Discharge Planning Services: CM Consult Post Acute Care Choice: Durable Medical Equipment          DME Arranged: Oxygen DME Agency: [place] Date DME Agency Contacted: 09/02/19 Time DME Agency Contacted: [phone] Representative spoke with at DME Agency: Magda Paganini HH Arranged: RN [place] Agency: [place] Date [place]: 09/02/19 Time Micro: [id number]    Social Determinants of Health (SDOH) Interventions      Readmission Risk Interventions No flowsheet data found.

## 2019-09-06 ENCOUNTER — Emergency Department (HOSPITAL_COMMUNITY): Payer: Medicare HMO

## 2019-09-06 ENCOUNTER — Other Ambulatory Visit: Payer: Self-pay

## 2019-09-06 ENCOUNTER — Inpatient Hospital Stay (HOSPITAL_COMMUNITY)
Admission: EM | Admit: 2019-09-06 | Discharge: 2019-09-09 | DRG: 948 | Disposition: A | Discharge status: Home Health | Payer: Medicare HMO | Attending: Internal Medicine | Admitting: Internal Medicine

## 2019-09-06 DIAGNOSIS — Z885 Allergy status to narcotic agent status: Secondary | ICD-10-CM

## 2019-09-06 DIAGNOSIS — Z951 Presence of aortocoronary bypass graft: Secondary | ICD-10-CM

## 2019-09-06 DIAGNOSIS — R55 Syncope and collapse: Secondary | ICD-10-CM

## 2019-09-06 DIAGNOSIS — J449 Chronic obstructive pulmonary disease, unspecified: Secondary | ICD-10-CM | POA: Diagnosis present

## 2019-09-06 DIAGNOSIS — Z7984 Long term (current) use of oral hypoglycemic drugs: Secondary | ICD-10-CM

## 2019-09-06 DIAGNOSIS — I252 Old myocardial infarction: Secondary | ICD-10-CM

## 2019-09-06 DIAGNOSIS — Z87891 Personal history of nicotine dependence: Secondary | ICD-10-CM

## 2019-09-06 DIAGNOSIS — Z7951 Long term (current) use of inhaled steroids: Secondary | ICD-10-CM

## 2019-09-06 DIAGNOSIS — Z7902 Long term (current) use of antithrombotics/antiplatelets: Secondary | ICD-10-CM

## 2019-09-06 DIAGNOSIS — Z79899 Other long term (current) drug therapy: Secondary | ICD-10-CM

## 2019-09-06 DIAGNOSIS — E119 Type 2 diabetes mellitus without complications: Secondary | ICD-10-CM | POA: Diagnosis present

## 2019-09-06 DIAGNOSIS — B948 Sequelae of other specified infectious and parasitic diseases: Secondary | ICD-10-CM

## 2019-09-06 DIAGNOSIS — E785 Hyperlipidemia, unspecified: Secondary | ICD-10-CM | POA: Diagnosis present

## 2019-09-06 DIAGNOSIS — R0602 Shortness of breath: Secondary | ICD-10-CM

## 2019-09-06 DIAGNOSIS — Z955 Presence of coronary angioplasty implant and graft: Secondary | ICD-10-CM

## 2019-09-06 DIAGNOSIS — R079 Chest pain, unspecified: Secondary | ICD-10-CM | POA: Diagnosis not present

## 2019-09-06 DIAGNOSIS — I2510 Atherosclerotic heart disease of native coronary artery without angina pectoris: Secondary | ICD-10-CM | POA: Diagnosis present

## 2019-09-06 DIAGNOSIS — I10 Essential (primary) hypertension: Secondary | ICD-10-CM | POA: Diagnosis present

## 2019-09-06 DIAGNOSIS — R531 Weakness: Secondary | ICD-10-CM | POA: Diagnosis not present

## 2019-09-06 DIAGNOSIS — Z7982 Long term (current) use of aspirin: Secondary | ICD-10-CM

## 2019-09-06 DIAGNOSIS — U071 COVID-19: Secondary | ICD-10-CM | POA: Diagnosis not present

## 2019-09-06 DIAGNOSIS — K219 Gastro-esophageal reflux disease without esophagitis: Secondary | ICD-10-CM | POA: Diagnosis present

## 2019-09-06 LAB — BASIC METABOLIC PANEL
Anion gap: 8 (ref 5–15)
BUN: 16 mg/dL (ref 8–23)
CO2: 25 mmol/L (ref 22–32)
Calcium: 9 mg/dL (ref 8.9–10.3)
Chloride: 100 mmol/L (ref 98–111)
Creatinine, Ser: 0.91 mg/dL (ref 0.61–1.24)
GFR calc Af Amer: >60 mL/min (ref >60)
GFR calc non Af Amer: >60 mL/min (ref >60)
Glucose, Bld: 152 mg/dL — ABNORMAL HIGH (ref 70–99)
Potassium: 3.7 mmol/L (ref 3.5–5.1)
Sodium: 133 mmol/L — ABNORMAL LOW (ref 135–145)

## 2019-09-06 LAB — TROPONIN I (HIGH SENSITIVITY)
Troponin I (High Sensitivity): 5 ng/L (ref <18)
Troponin I (High Sensitivity): 6 ng/L (ref <18)

## 2019-09-06 LAB — D-DIMER, QUANTITATIVE: D-Dimer, Quant: 0.49 ug/mL-FEU (ref 0.00–0.50)

## 2019-09-06 LAB — CBC
HCT: 44.1 % (ref 39.0–52.0)
Hemoglobin: 14.5 g/dL (ref 13.0–17.0)
MCH: 28.8 pg (ref 26.0–34.0)
MCHC: 32.9 g/dL (ref 30.0–36.0)
MCV: 87.7 fL (ref 80.0–100.0)
Platelets: 171 10*3/uL (ref 150–400)
RBC: 5.03 MIL/uL (ref 4.22–5.81)
RDW: 13 % (ref 11.5–15.5)
WBC: 7.9 10*3/uL (ref 4.0–10.5)
nRBC: 0 % (ref 0.0–0.2)

## 2019-09-06 MED ORDER — MOMETASONE FURO-FORMOTEROL FUM 200-5 MCG/ACT IN AERO
2.0000 | INHALATION_SPRAY | Freq: Two times a day (BID) | RESPIRATORY_TRACT | Status: DC
  Administered 2019-09-07 – 2019-09-09 (×5): 2 via RESPIRATORY_TRACT
  Filled 2019-09-06: qty 8.8

## 2019-09-06 MED ORDER — RISPERIDONE 0.5 MG PO TABS
0.5000 mg | ORAL_TABLET | Freq: Two times a day (BID) | ORAL | Status: DC
  Administered 2019-09-07 – 2019-09-09 (×5): 0.5 mg via ORAL
  Filled 2019-09-06 (×8): qty 1

## 2019-09-06 MED ORDER — MECLIZINE HCL 25 MG PO TABS
25.0000 mg | ORAL_TABLET | Freq: Three times a day (TID) | ORAL | Status: DC | PRN
  Filled 2019-09-06: qty 1

## 2019-09-06 MED ORDER — MAGNESIUM OXIDE 400 (241.3 MG) MG PO TABS
800.0000 mg | ORAL_TABLET | Freq: Every day | ORAL | Status: DC
  Administered 2019-09-07 – 2019-09-08 (×2): 800 mg via ORAL
  Filled 2019-09-06 (×2): qty 2

## 2019-09-06 MED ORDER — PREDNISONE 20 MG PO TABS: 30.0000 mg | ORAL_TABLET | Freq: Every day | ORAL | Status: DC

## 2019-09-06 MED ORDER — FLUTICASONE PROPIONATE 50 MCG/ACT NA SUSP
2.0000 | Freq: Every day | NASAL | Status: DC
  Administered 2019-09-07 – 2019-09-09 (×3): 2 via NASAL
  Filled 2019-09-06: qty 16

## 2019-09-06 MED ORDER — DULOXETINE HCL 60 MG PO CPEP
60.0000 mg | ORAL_CAPSULE | Freq: Two times a day (BID) | ORAL | Status: DC
  Administered 2019-09-07 – 2019-09-09 (×5): 60 mg via ORAL
  Filled 2019-09-06 (×8): qty 1

## 2019-09-06 MED ORDER — DICYCLOMINE HCL 20 MG PO TABS
20.0000 mg | ORAL_TABLET | Freq: Three times a day (TID) | ORAL | Status: DC
  Administered 2019-09-07 – 2019-09-09 (×6): 20 mg via ORAL
  Filled 2019-09-06 (×11): qty 1

## 2019-09-06 MED ORDER — ONDANSETRON HCL 4 MG/2ML IJ SOLN: 4.0000 mg | Freq: Four times a day (QID) | INTRAMUSCULAR | Status: DC | PRN

## 2019-09-06 MED ORDER — PANTOPRAZOLE SODIUM 40 MG PO TBEC
80.0000 mg | DELAYED_RELEASE_TABLET | Freq: Every day | ORAL | Status: DC
  Administered 2019-09-07 – 2019-09-09 (×3): 80 mg via ORAL
  Filled 2019-09-06 (×3): qty 2

## 2019-09-06 MED ORDER — MONTELUKAST SODIUM 10 MG PO TABS
10.0000 mg | ORAL_TABLET | Freq: Every day | ORAL | Status: DC
  Administered 2019-09-07 – 2019-09-08 (×2): 10 mg via ORAL
  Filled 2019-09-06 (×2): qty 1

## 2019-09-06 MED ORDER — PREGABALIN 50 MG PO CAPS
100.0000 mg | ORAL_CAPSULE | Freq: Three times a day (TID) | ORAL | Status: DC
  Administered 2019-09-07 – 2019-09-09 (×8): 100 mg via ORAL
  Filled 2019-09-06 (×8): qty 2

## 2019-09-06 MED ORDER — ATORVASTATIN CALCIUM 40 MG PO TABS
40.0000 mg | ORAL_TABLET | Freq: Every day | ORAL | Status: DC
  Administered 2019-09-07 – 2019-09-09 (×3): 40 mg via ORAL
  Filled 2019-09-06 (×2): qty 1

## 2019-09-06 MED ORDER — METOPROLOL SUCCINATE ER 25 MG PO TB24
25.0000 mg | ORAL_TABLET | Freq: Every day | ORAL | Status: DC
  Administered 2019-09-07 – 2019-09-09 (×3): 25 mg via ORAL
  Filled 2019-09-06 (×3): qty 1

## 2019-09-06 MED ORDER — PREDNISONE 10 MG PO TABS: 10.0000 mg | ORAL_TABLET | Freq: Every day | ORAL | Status: DC

## 2019-09-06 MED ORDER — SODIUM CHLORIDE 0.9 % IV BOLUS
500.0000 mL | Freq: Once | INTRAVENOUS | Status: AC
  Administered 2019-09-06: 21:00:00 500 mL via INTRAVENOUS

## 2019-09-06 MED ORDER — PREDNISONE 20 MG PO TABS
30.0000 mg | ORAL_TABLET | Freq: Every day | ORAL | Status: AC
  Administered 2019-09-07: 30 mg via ORAL
  Filled 2019-09-06: qty 1

## 2019-09-06 MED ORDER — BUSPIRONE HCL 10 MG PO TABS
10.0000 mg | ORAL_TABLET | Freq: Two times a day (BID) | ORAL | Status: DC
  Administered 2019-09-07 – 2019-09-09 (×5): 10 mg via ORAL
  Filled 2019-09-06 (×7): qty 1

## 2019-09-06 MED ORDER — METFORMIN HCL 500 MG PO TABS
1000.0000 mg | ORAL_TABLET | Freq: Two times a day (BID) | ORAL | Status: DC
  Administered 2019-09-07 – 2019-09-09 (×6): 1000 mg via ORAL
  Filled 2019-09-06 (×6): qty 2

## 2019-09-06 MED ORDER — ENOXAPARIN SODIUM 40 MG/0.4ML ~~LOC~~ SOLN
40.0000 mg | SUBCUTANEOUS | Status: DC
  Administered 2019-09-07 – 2019-09-09 (×3): 40 mg via SUBCUTANEOUS
  Filled 2019-09-06 (×3): qty 0.4

## 2019-09-06 MED ORDER — ACETAMINOPHEN 325 MG PO TABS
650.0000 mg | ORAL_TABLET | Freq: Four times a day (QID) | ORAL | Status: DC | PRN
  Administered 2019-09-07: 650 mg via ORAL
  Filled 2019-09-06: qty 2

## 2019-09-06 MED ORDER — TRIAMCINOLONE ACETONIDE 0.1 % EX CREA
1.0000 "application " | TOPICAL_CREAM | Freq: Two times a day (BID) | CUTANEOUS | Status: DC
  Administered 2019-09-07 – 2019-09-09 (×4): 1 via TOPICAL
  Filled 2019-09-06: qty 15

## 2019-09-06 MED ORDER — PREDNISONE 20 MG PO TABS: 20.0000 mg | ORAL_TABLET | Freq: Every day | ORAL | Status: DC

## 2019-09-06 MED ORDER — GLIPIZIDE ER 2.5 MG PO TB24
2.5000 mg | ORAL_TABLET | Freq: Every day | ORAL | Status: DC
  Administered 2019-09-07 – 2019-09-09 (×3): 2.5 mg via ORAL
  Filled 2019-09-06 (×4): qty 1

## 2019-09-06 MED ORDER — VITAMIN D 25 MCG (1000 UNIT) PO TABS
1000.0000 [IU] | ORAL_TABLET | Freq: Every day | ORAL | Status: DC
  Administered 2019-09-07 – 2019-09-09 (×3): 1000 [IU] via ORAL
  Filled 2019-09-06 (×3): qty 1

## 2019-09-06 MED ORDER — PREDNISONE 20 MG PO TABS
20.0000 mg | ORAL_TABLET | Freq: Every day | ORAL | Status: DC
  Administered 2019-09-08 – 2019-09-09 (×2): 20 mg via ORAL
  Filled 2019-09-06 (×2): qty 1

## 2019-09-06 MED ORDER — LINACLOTIDE 145 MCG PO CAPS
290.0000 ug | ORAL_CAPSULE | Freq: Every day | ORAL | Status: DC
  Administered 2019-09-07 – 2019-09-09 (×3): 290 ug via ORAL
  Filled 2019-09-06 (×4): qty 2

## 2019-09-06 MED ORDER — LORATADINE 10 MG PO TABS
10.0000 mg | ORAL_TABLET | Freq: Every day | ORAL | Status: DC
  Administered 2019-09-07 – 2019-09-09 (×3): 10 mg via ORAL
  Filled 2019-09-06 (×3): qty 1

## 2019-09-06 MED ORDER — PREDNISOLONE 5 MG PO TABS: 10.0000 mg | ORAL_TABLET | Freq: Every day | ORAL | Status: DC

## 2019-09-06 MED ORDER — ALBUTEROL SULFATE HFA 108 (90 BASE) MCG/ACT IN AERS
2.0000 | INHALATION_SPRAY | Freq: Four times a day (QID) | RESPIRATORY_TRACT | Status: DC | PRN
  Filled 2019-09-06: qty 6.7

## 2019-09-06 MED ORDER — TAMSULOSIN HCL 0.4 MG PO CAPS
0.4000 mg | ORAL_CAPSULE | Freq: Every day | ORAL | Status: DC
  Administered 2019-09-07 – 2019-09-08 (×2): 0.4 mg via ORAL
  Filled 2019-09-06 (×2): qty 1

## 2019-09-06 MED ORDER — HYDROCODONE-ACETAMINOPHEN 5-325 MG PO TABS: 1.0000 | ORAL_TABLET | Freq: Four times a day (QID) | ORAL | Status: DC | PRN

## 2019-09-06 MED ORDER — LISINOPRIL 10 MG PO TABS
5.0000 mg | ORAL_TABLET | Freq: Every day | ORAL | Status: DC
  Administered 2019-09-07 – 2019-09-09 (×3): 5 mg via ORAL
  Filled 2019-09-06 (×3): qty 1

## 2019-09-06 MED ORDER — ONDANSETRON HCL 4 MG PO TABS: 4.0000 mg | ORAL_TABLET | Freq: Four times a day (QID) | ORAL | Status: DC | PRN

## 2019-09-06 MED ORDER — ASPIRIN EC 325 MG PO TBEC
325.0000 mg | DELAYED_RELEASE_TABLET | Freq: Every day | ORAL | Status: DC
  Administered 2019-09-07 – 2019-09-09 (×3): 325 mg via ORAL
  Filled 2019-09-06 (×4): qty 1

## 2019-09-06 MED ORDER — CLOPIDOGREL BISULFATE 75 MG PO TABS
75.0000 mg | ORAL_TABLET | Freq: Every day | ORAL | Status: DC
  Administered 2019-09-07 – 2019-09-09 (×3): 75 mg via ORAL
  Filled 2019-09-06 (×3): qty 1
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
## 2019-09-06 NOTE — ED Notes (Signed)
Called [facility] for transport [facility]--Ian Morton
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
## 2019-09-06 NOTE — ED Notes (Addendum)
Pt sitting in wheelchair in middle of waiting room.  RN saw pt leaning over in wheelchair with blankets around him.  Pt began leaning forward out of wheelchair.  RN assisted pt as he layed down in floor.  Pt did not fall and did not hit head.  Pt states "I want to go home".  Pt assisted back into wheelchair.    Chart review shows pt COVID +, pt taken to treatment room.

## 2019-09-06 NOTE — ED Triage Notes (Signed)
Pt to ER for evaluation of right chest pain. EMS states he refused nitro, EKG wnl, did take aspirin 324 mg, 18LFA in place. EMS reports unable to explain his symptoms well. It is noted in triage he has a very large bruise to his LLQ of his abdomen. He is a/o x4.
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
## 2019-09-07 ENCOUNTER — Encounter (HOSPITAL_COMMUNITY): Payer: Self-pay

## 2019-09-07 ENCOUNTER — Observation Stay (HOSPITAL_COMMUNITY): Payer: Medicare HMO

## 2019-09-07 DIAGNOSIS — E785 Hyperlipidemia, unspecified: Secondary | ICD-10-CM | POA: Diagnosis present

## 2019-09-07 DIAGNOSIS — Z7982 Long term (current) use of aspirin: Secondary | ICD-10-CM | POA: Diagnosis not present

## 2019-09-07 DIAGNOSIS — B948 Sequelae of other specified infectious and parasitic diseases: Secondary | ICD-10-CM | POA: Diagnosis not present

## 2019-09-07 DIAGNOSIS — Z885 Allergy status to narcotic agent status: Secondary | ICD-10-CM | POA: Diagnosis not present

## 2019-09-07 DIAGNOSIS — R079 Chest pain, unspecified: Secondary | ICD-10-CM | POA: Diagnosis present

## 2019-09-07 DIAGNOSIS — Z951 Presence of aortocoronary bypass graft: Secondary | ICD-10-CM | POA: Diagnosis not present

## 2019-09-07 DIAGNOSIS — E119 Type 2 diabetes mellitus without complications: Secondary | ICD-10-CM | POA: Diagnosis not present

## 2019-09-07 DIAGNOSIS — Z79899 Other long term (current) drug therapy: Secondary | ICD-10-CM | POA: Diagnosis not present

## 2019-09-07 DIAGNOSIS — U071 COVID-19: Secondary | ICD-10-CM | POA: Diagnosis not present

## 2019-09-07 DIAGNOSIS — R55 Syncope and collapse: Secondary | ICD-10-CM

## 2019-09-07 DIAGNOSIS — I2510 Atherosclerotic heart disease of native coronary artery without angina pectoris: Secondary | ICD-10-CM | POA: Diagnosis present

## 2019-09-07 DIAGNOSIS — K219 Gastro-esophageal reflux disease without esophagitis: Secondary | ICD-10-CM | POA: Diagnosis present

## 2019-09-07 DIAGNOSIS — Z7951 Long term (current) use of inhaled steroids: Secondary | ICD-10-CM | POA: Diagnosis not present

## 2019-09-07 DIAGNOSIS — I10 Essential (primary) hypertension: Secondary | ICD-10-CM | POA: Diagnosis present

## 2019-09-07 DIAGNOSIS — Z955 Presence of coronary angioplasty implant and graft: Secondary | ICD-10-CM | POA: Diagnosis not present

## 2019-09-07 DIAGNOSIS — Z7984 Long term (current) use of oral hypoglycemic drugs: Secondary | ICD-10-CM | POA: Diagnosis not present

## 2019-09-07 DIAGNOSIS — Z7902 Long term (current) use of antithrombotics/antiplatelets: Secondary | ICD-10-CM | POA: Diagnosis not present

## 2019-09-07 DIAGNOSIS — I252 Old myocardial infarction: Secondary | ICD-10-CM | POA: Diagnosis not present

## 2019-09-07 DIAGNOSIS — R531 Weakness: Secondary | ICD-10-CM

## 2019-09-07 DIAGNOSIS — Z87891 Personal history of nicotine dependence: Secondary | ICD-10-CM | POA: Diagnosis not present

## 2019-09-07 DIAGNOSIS — J449 Chronic obstructive pulmonary disease, unspecified: Secondary | ICD-10-CM | POA: Diagnosis present

## 2019-09-07 LAB — ECHOCARDIOGRAM COMPLETE
Height: 70 in
Weight: 3085.03 oz

## 2019-09-07 LAB — HIV ANTIBODY (ROUTINE TESTING W REFLEX): HIV Screen 4th Generation wRfx: NONREACTIVE

## 2019-09-07 NOTE — Progress Notes (Addendum)
PROGRESS NOTE  Ian Morton NID:[id number] DOB: 07/09/1951 DOA: 09/06/2019 PCP: Jackie Plum, MD   LOS: 0 days   Brief Narrative / Interim history: 68 year old male with history of CAD, DM 2, HTN, COPD, recently admitted 9/23-9/28 with COVID, improved with treatment and discharged on room air at rest and 2 L with ambulation.  He states that he was progressively weak, unable to do his ADLs, felt very short of breath and came back to the hospital  Subjective / 24h Interval events: Complains of generalized weakness this morning.  Appears to be breathing hard but denies shortness of breath.  Assessment & Plan: Principal Problem:   Generalized weakness Active Problems:   Hypertension   Diabetes mellitus type II, non insulin dependent (HCC)   Coronary artery disease   COPD (chronic obstructive pulmonary disease) (HCC)   COVID-19   Syncope   Principal Problem Generalized weakness post Covid-19 -Sequela of ongoing/recent COVID-19 illness as well as steroid use -PT/OT consulted, they recommended home health PT and supervision/assistance 24-hour a day.  -Patient has no help at home, lives by himself, separated from his wife's and he has children who live with his wife but nobody is helping him.  He lives in a 1 bedroom apartment and cannot care for himself, and it appears that this is not on a safe discharge at this point in time due to inability to have /24-hour supervision and inability to perform daily activities.  He will continue to require inpatient stay. -As far as his Covid infection this seems to be stable, here on room air and finished treatment during his prior hospital stay  COVID-19 Labs  Recent Labs    09/06/19 2104  DDIMER 0.49    Lab Results  Component Value Date   SARSCOV2NAA POSITIVE (A) 08/28/2019    Active Problems Syncopal episode -In the ED, probably vasovagal as this occurred while blood being drawn.  2D echo ordered and pending.  No events on telemetry.   EKG without ischemic findings, troponin was negative x2.  His d-dimer was negative.  COPD -No wheezing, not acute exacerbation, continue home medications  History of CAD -Continue aspirin, Plavix, statin  Type 2 diabetes mellitus, non-insulin-dependent -Continued on home medications  Hypertension -Continue home medications, blood pressure controlled    Scheduled Meds: . aspirin  325 mg Oral Daily  . atorvastatin  40 mg Oral q1800  . busPIRone  10 mg Oral BID  . cholecalciferol  1,000 Units Oral Daily  . clopidogrel  75 mg Oral Daily  . dicyclomine  20 mg Oral TID  . DULoxetine  60 mg Oral BID  . enoxaparin (LOVENOX) injection  40 mg Subcutaneous Q24H  . fluticasone  2 spray Each Nare Daily  . glipiZIDE  2.5 mg Oral Q breakfast  . linaclotide  290 mcg Oral Daily  . lisinopril  5 mg Oral Daily  . loratadine  10 mg Oral Daily  . magnesium oxide  800 mg Oral QHS  . metFORMIN  1,000 mg Oral BID WC  . metoprolol succinate  25 mg Oral Daily  . mometasone-formoterol  2 puff Inhalation BID  . montelukast  10 mg Oral QHS  . pantoprazole  80 mg Oral Daily  . [START ON 09/08/2019] predniSONE  20 mg Oral Q breakfast   Followed by  . [START ON 09/11/2019] predniSONE  10 mg Oral Q breakfast  . pregabalin  100 mg Oral TID  . risperiDONE  0.5 mg Oral BID  . tamsulosin  0.4  mg Oral QHS  . triamcinolone cream  1 application Topical BID   Continuous Infusions: PRN Meds:.acetaminophen, albuterol, HYDROcodone-acetaminophen, meclizine, ondansetron **OR** ondansetron (ZOFRAN) IV  DVT prophylaxis: Lovenox Code Status: Full code Family Communication: Trying to reach daughter, called several times without response Disposition Plan: To be determined  Consultants:  None  Procedures:  None   Microbiology: None   Antimicrobials: None    Objective: Vitals:   09/07/19 0242 09/07/19 0402 09/07/19 0530 09/07/19 0826  BP:  134/84 117/68 137/79  Pulse:  64 63 (!) 58  Resp:  18 18 18    Temp:  98.2 F (36.8 C)  97.6 F (36.4 C)  TempSrc:  Oral  Oral  SpO2:  95% 95% 97%  Weight: 87.5 kg     Height: 5\' 10"  (1.778 m)       Intake/Output Summary (Last 24 hours) at 09/07/2019 1115 Last data filed at 09/07/2019 1020 Gross per 24 hour  Intake 630 ml  Output 300 ml  Net 330 ml   Filed Weights   09/07/19 0242  Weight: 87.5 kg    Examination:  Constitutional: NAD Eyes: PERRL, lids and conjunctivae normal ENMT: Mucous membranes are moist.  Respiratory: clear to auscultation bilaterally, no wheezing, no crackles.  Shallow breathing, tachypneic, appears to have increased respiratory effort Cardiovascular: Regular rate and rhythm, no murmurs / rubs / gallops. No LE edema. 2+ pedal pulses.  Abdomen: no tenderness. Bowel sounds positive.  Musculoskeletal: no clubbing / cyanosis.  Skin: no rashes Neurologic: non focal   Data Reviewed: I have independently reviewed following labs and imaging studies   CBC: Recent Labs  Lab 09/06/19 1531  WBC 7.9  HGB 14.5  HCT 44.1  MCV 87.7  PLT 824   Basic Metabolic Panel: Recent Labs  Lab 09/06/19 1531  NA 133*  K 3.7  CL 100  CO2 25  GLUCOSE 152*  BUN 16  CREATININE 0.91  CALCIUM 9.0   GFR: Estimated Creatinine Clearance: 78 mL/min (by C-G formula based on SCr of 0.91 mg/dL). Liver Function Tests: No results for input(s): AST, ALT, ALKPHOS, BILITOT, PROT, ALBUMIN in the last 168 hours. No results for input(s): LIPASE, AMYLASE in the last 168 hours. No results for input(s): AMMONIA in the last 168 hours. Coagulation Profile: No results for input(s): INR, PROTIME in the last 168 hours. Cardiac Enzymes: No results for input(s): CKTOTAL, CKMB, CKMBINDEX, TROPONINI in the last 168 hours. BNP (last 3 results) No results for input(s): PROBNP in the last 8760 hours. HbA1C: No results for input(s): HGBA1C in the last 72 hours. CBG: Recent Labs  Lab 09/01/19 1516 09/01/19 2032 09/02/19 0728 09/02/19 1139 09/02/19  1534  GLUCAP 320* 357* 185* 223* 352*   Lipid Profile: No results for input(s): CHOL, HDL, LDLCALC, TRIG, CHOLHDL, LDLDIRECT in the last 72 hours. Thyroid Function Tests: No results for input(s): TSH, T4TOTAL, FREET4, T3FREE, THYROIDAB in the last 72 hours. Anemia Panel: No results for input(s): VITAMINB12, FOLATE, FERRITIN, TIBC, IRON, RETICCTPCT in the last 72 hours. Urine analysis:    Component Value Date/Time   COLORURINE STRAW (A) 04/17/2018 [place] 04/17/2018 1348   LABSPEC 1.008 04/17/2018 1348   PHURINE 8.0 04/17/2018 [place] 04/17/2018 1348   HGBUR NEGATIVE 04/17/2018 [place] 04/17/2018 [place] 04/17/2018 1348   PROTEINUR NEGATIVE 04/17/2018 1348   UROBILINOGEN 0.2 06/10/2014 0027   NITRITE NEGATIVE 04/17/2018 [place] 04/17/2018 1348  Sepsis Labs: Invalid input(s): PROCALCITONIN, LACTICIDVEN  Recent Results (from the past 240 hour(s))  SARS Coronavirus 2 [facility] order, Performed in [facility] hospital lab) Nasopharyngeal Nasopharyngeal Swab     Status: Abnormal   Collection Time: 08/28/19  8:13 PM   Specimen: Nasopharyngeal Swab  Result Value Ref Range Status   SARS Coronavirus 2 POSITIVE (A) NEGATIVE Final    Comment: RESULT CALLED TO, READ BACK BY AND VERIFIED WITH: L. VENEGAS,RN 2344 08/28/2019 T. TYSOR (NOTE) If result is NEGATIVE SARS-CoV-2 target nucleic acids are NOT DETECTED. The SARS-CoV-2 RNA is generally detectable in upper and lower  respiratory specimens during the acute phase of infection. The lowest  concentration of SARS-CoV-2 viral copies this assay can detect is 250  copies / mL. A negative result does not preclude SARS-CoV-2 infection  and should not be used as the sole basis for treatment or other  patient management decisions.  A negative result may occur with  improper specimen collection / handling, submission of specimen other  than  nasopharyngeal swab, presence of viral mutation(s) within the  areas targeted by this assay, and inadequate number of viral copies  (<250 copies / mL). A negative result must be combined with clinical  observations, patient history, and epidemiological information. If result is POSITIVE SARS-CoV-2 target nucleic acids are DETECTED.  The SARS-CoV-2 RNA is generally detectable in upper and lower  respiratory specimens during the acute phase of infection.  Positive  results are indicative of active infection with SARS-CoV-2.  Clinical  correlation with patient history and other diagnostic information is  necessary to determine patient infection status.  Positive results do  not rule out bacterial infection or co-infection with other viruses. If result is PRESUMPTIVE POSTIVE SARS-CoV-2 nucleic acids MAY BE PRESENT.   A presumptive positive result was obtained on the submitted specimen  and confirmed on repeat testing.  While 2019 novel coronavirus  (SARS-CoV-2) nucleic acids may be present in the submitted sample  additional confirmatory testing may be necessary for epidemiological  and / or clinical management purposes  to differentiate between  SARS-CoV-2 and other Sarbecovirus currently known to infect humans.  If clinically indicated additional testing with an alternate test  methodology [phone])  is advised. The SARS-CoV-2 RNA is generally  detectable in upper and lower respiratory specimens during the acute  phase of infection. The expected result is Negative. Fact Sheet for Patients:  BoilerBrush.com.cy Fact Sheet for Healthcare Providers: https://pope.com/ This test is not yet approved or cleared by the [place] FDA and has been authorized for detection and/or diagnosis of SARS-CoV-2 by FDA under an Emergency Use Authorization (EUA).  This EUA will remain in effect (meaning this test can be used) for the duration of the  COVID-19 declaration under Section 564(b)(1) of the Act, 21 U.S.C. section 360bbb-3(b)(1), unless the authorization is terminated or revoked sooner. Performed at [facility], [address] [place], [place] [address]       Radiology Studies: Dg Chest 2 View  Result Date: 09/06/2019 CLINICAL DATA:  Chest pain EXAM: CHEST - 2 VIEW COMPARISON:  08/28/2019 FINDINGS: COPD with hyperinflation.  Bibasilar scarring/atelectasis unchanged. Prior median sternotomy. Negative for heart failure. Negative for pneumonia or effusion. Chronic rib fractures bilaterally. IMPRESSION: COPD with bibasilar scarring. No superimposed acute abnormality and no change from the prior study. Electronically Signed   By: Marlan Palau M.D.   On: 09/06/2019 17:01   Pamella Pert, MD, PhD [facility]  Contact via  [email or web address]  [place] Office Info P: [phone] F: [phone]

## 2019-09-07 NOTE — Progress Notes (Signed)
  Echocardiogram 2D Echocardiogram has been performed.  Ian Morton 09/07/2019, 8:51 AM

## 2019-09-07 NOTE — Evaluation (Addendum)
Physical Therapy Evaluation Patient Details Name: Ian Morton MRN: [id number] DOB: 01/30/1951 Today's Date: 09/07/2019   History of Present Illness  68 y/o M w/ hx of COPD, CAD, HTN, DM II, GERD, MI, PNA, subdural hematoma and vertigo. 09/23 ER w/ fever chills SOB and body aches.  Clinical Impression   Pt admitted with above diagnosis. Pt currently with functional limitations due to the deficits listed below (see PT Problem List). Pt was able to complete all tasks in assessment with SBA-mod I and cues, ambulates in room approx 64ft w/ no AD, on room air and maintain sats >95% throughout. Pt will benefit from skilled PT to increase his strength, activity tolerance, independence and safety with mobility to allow for safe discharge home alone.       Follow Up Recommendations Home health PT;Supervision/Assistance - 24 hour    Equipment Recommendations  None recommended by PT    Recommendations for Other Services       Precautions / Restrictions Precautions Precautions: Fall Restrictions Weight Bearing Restrictions: No      Mobility  Bed Mobility Overal bed mobility: Modified Independent             General bed mobility comments: increased time to complete   Transfers Overall transfer level: Needs assistance Equipment used: None Transfers: Sit to/from Stand;Stand Pivot Transfers Sit to Stand: Supervision Stand pivot transfers: Supervision          Ambulation/Gait Ambulation/Gait assistance: Supervision Gait Distance (Feet): 50 Feet Assistive device: None Gait Pattern/deviations: Step-through pattern     General Gait Details: slow cautious cadence  Stairs            Wheelchair Mobility    Modified Rankin (Stroke Patients Only)       Balance Overall balance assessment: Needs assistance Sitting-balance support: Feet supported Sitting balance-Leahy Scale: Good     Standing balance support: No upper extremity supported Standing balance-Leahy Scale:  Good                               Pertinent Vitals/Pain Pain Assessment: 0-10 Pain Score: 4  Pain Location: L flank, has big purple bruise states from hospital Pain Intervention(s): Limited activity within patient's tolerance    Home Living Family/patient expects to be discharged to:: Private residence Living Arrangements: Alone   Type of Home: Apartment Home Access: Level entry     Home Layout: One level Home Equipment: None      Prior Function Level of Independence: Independent               Hand Dominance        Extremity/Trunk Assessment   Upper Extremity Assessment Upper Extremity Assessment: Overall WFL for tasks assessed    Lower Extremity Assessment Lower Extremity Assessment: Overall WFL for tasks assessed    Cervical / Trunk Assessment Cervical / Trunk Assessment: Normal  Communication   Communication: HOH  Cognition Arousal/Alertness: Lethargic Behavior During Therapy: WFL for tasks assessed/performed Overall Cognitive Status: Within Functional Limits for tasks assessed                                        General Comments General comments (skin integrity, edema, etc.): Pt fatigued during assessment, agreeable to assessment. Pt is at [facility] I with functional mobility, he does exhibit weakness and decreased activity tolerance but is able to complete  all tasks on room air and maintain sats in high 90s throughout.    Exercises     Assessment/Plan    PT Assessment Patient needs continued PT services  PT Problem List Decreased strength;Decreased activity tolerance;Decreased safety awareness       PT Treatment Interventions Gait training;Functional mobility training;Therapeutic activities;Therapeutic exercise;Balance training;Neuromuscular re-education    PT Goals (Current goals can be found in the Care Plan section)  Acute Rehab PT Goals Time For Goal Achievement: 09/21/19 Potential to Achieve Goals:  Good    Frequency Min 3X/week   Barriers to discharge (lives alone)      Co-evaluation               AM-PAC PT "6 Clicks" Mobility  Outcome Measure Help needed turning from your back to your side while in a flat bed without using bedrails?: None Help needed moving from lying on your back to sitting on the side of a flat bed without using bedrails?: None Help needed moving to and from a bed to a chair (including a wheelchair)?: A Little Help needed standing up from a chair using your arms (e.g., wheelchair or bedside chair)?: A Little Help needed to walk in hospital room?: A Little Help needed climbing 3-5 steps with a railing? : A Lot 6 Click Score: 19    End of Session   Activity Tolerance: Patient limited by fatigue Patient left: in bed;with call bell/phone within reach   PT Visit Diagnosis: Muscle weakness (generalized) (M62.81);Difficulty in walking, not elsewhere classified (R26.2)    Time: 9798-9211 PT Time Calculation (min) (ACUTE ONLY): 27 min   Charges:   PT Evaluation $PT Eval Low Complexity: 1 Low PT Treatments $Therapeutic Activity: 8-22 mins        Horald Chestnut, PT   Delford Field 09/07/2019, 11:34 AM

## 2019-09-08 DIAGNOSIS — U071 COVID-19: Secondary | ICD-10-CM | POA: Diagnosis not present

## 2019-09-08 DIAGNOSIS — J449 Chronic obstructive pulmonary disease, unspecified: Secondary | ICD-10-CM | POA: Diagnosis not present

## 2019-09-08 DIAGNOSIS — R531 Weakness: Secondary | ICD-10-CM | POA: Diagnosis not present

## 2019-09-08 LAB — BASIC METABOLIC PANEL
Anion gap: 6 (ref 5–15)
BUN: 22 mg/dL (ref 8–23)
CO2: 27 mmol/L (ref 22–32)
Calcium: 9.1 mg/dL (ref 8.9–10.3)
Chloride: 104 mmol/L (ref 98–111)
Creatinine, Ser: 0.93 mg/dL (ref 0.61–1.24)
GFR calc Af Amer: >60 mL/min (ref >60)
GFR calc non Af Amer: >60 mL/min (ref >60)
Glucose, Bld: 172 mg/dL — ABNORMAL HIGH (ref 70–99)
Potassium: 4.3 mmol/L (ref 3.5–5.1)
Sodium: 137 mmol/L (ref 135–145)

## 2019-09-08 LAB — CBC
HCT: 43.6 % (ref 39.0–52.0)
Hemoglobin: 14.1 g/dL (ref 13.0–17.0)
MCH: 28.5 pg (ref 26.0–34.0)
MCHC: 32.3 g/dL (ref 30.0–36.0)
MCV: 88.3 fL (ref 80.0–100.0)
Platelets: 185 10*3/uL (ref 150–400)
RBC: 4.94 MIL/uL (ref 4.22–5.81)
RDW: 13.2 % (ref 11.5–15.5)
WBC: 8.7 10*3/uL (ref 4.0–10.5)
nRBC: 0 % (ref 0.0–0.2)

## 2019-09-08 MED ORDER — NAPHAZOLINE-PHENIRAMINE 0.025-0.3 % OP SOLN
1.0000 [drp] | Freq: Four times a day (QID) | OPHTHALMIC | Status: DC | PRN
  Administered 2019-09-08: 20:00:00 2 [drp] via OPHTHALMIC
  Filled 2019-09-08: qty 15

## 2019-09-08 NOTE — Progress Notes (Signed)
Patient in bed, all medication given well tolerated. No s/s of pain or distress. Attempted to contact wife, no answer. Will continue to monitor for remainder of shift.

## 2019-09-08 NOTE — Progress Notes (Signed)
PROGRESS NOTE  Ian Morton GMW:[id number] DOB: March 05, 1951 DOA: 09/06/2019 PCP: Jackie Plum, MD   LOS: 1 day   Brief Narrative / Interim history: 68 year old male with history of CAD, DM 2, HTN, COPD, recently admitted 9/23-9/28 with COVID, improved with treatment and discharged on room air at rest and 2 L with ambulation.  He states that he was progressively weak, unable to do his ADLs, felt very short of breath and came back to the hospital  Subjective / 24h Interval events: Still feeling quite weak.  No shortness of breath.  Complains of mild pain at the site of Lovenox shot  Assessment & Plan: Principal Problem:   Generalized weakness Active Problems:   Hypertension   Diabetes mellitus type II, non insulin dependent (HCC)   Coronary artery disease   COPD (chronic obstructive pulmonary disease) (HCC)   COVID-19   Syncope   Weakness   Principal Problem Generalized weakness post Covid-19 -Sequela of ongoing/recent COVID-19 illness as well as steroid use -PT/OT consulted, they recommended home health PT and supervision/assistance 24-hour a day.  -Patient has no help at home, lives by himself, separated from his wife's and he has children who live with his wife but nobody is helping him.  He lives in a 1 bedroom apartment and cannot care for himself, and it appears that this is not on a safe discharge at this point in time due to inability to have /24-hour supervision and inability to perform daily activities.  -I am unable to reach any family members, have tried calling his wife on her cell phone and landline along with her daughter on her cell phone without answer -He is recovering well post Covid  COVID-19 Labs  Recent Labs    09/06/19 2104  DDIMER 0.49    Lab Results  Component Value Date   SARSCOV2NAA POSITIVE (A) 08/28/2019    Active Problems Syncopal episode -In the ED, probably vasovagal as this occurred while blood being drawn.  2D echo unremarkable, full  read below.  No events on telemetry.  EKG without ischemic findings, troponin was negative x2.  His d-dimer was negative.  COPD -No wheezing, not acute exacerbation, continue home medications  History of CAD -Continue aspirin, Plavix, statin  Type 2 diabetes mellitus, non-insulin-dependent -Continued on home medications  Hypertension -Continue home medications, blood pressure controlled    Scheduled Meds: . aspirin  325 mg Oral Daily  . atorvastatin  40 mg Oral q1800  . busPIRone  10 mg Oral BID  . cholecalciferol  1,000 Units Oral Daily  . clopidogrel  75 mg Oral Daily  . dicyclomine  20 mg Oral TID  . DULoxetine  60 mg Oral BID  . enoxaparin (LOVENOX) injection  40 mg Subcutaneous Q24H  . fluticasone  2 spray Each Nare Daily  . glipiZIDE  2.5 mg Oral Q breakfast  . linaclotide  290 mcg Oral Daily  . lisinopril  5 mg Oral Daily  . loratadine  10 mg Oral Daily  . magnesium oxide  800 mg Oral QHS  . metFORMIN  1,000 mg Oral BID WC  . metoprolol succinate  25 mg Oral Daily  . mometasone-formoterol  2 puff Inhalation BID  . montelukast  10 mg Oral QHS  . pantoprazole  80 mg Oral Daily  . predniSONE  20 mg Oral Q breakfast   Followed by  . [START ON 09/11/2019] predniSONE  10 mg Oral Q breakfast  . pregabalin  100 mg Oral TID  . risperiDONE  0.5 mg Oral BID  . tamsulosin  0.4 mg Oral QHS  . triamcinolone cream  1 application Topical BID   Continuous Infusions: PRN Meds:.acetaminophen, albuterol, HYDROcodone-acetaminophen, meclizine, ondansetron **OR** ondansetron (ZOFRAN) IV  DVT prophylaxis: Lovenox Code Status: Full code Family Communication: Trying to reach daughter and wife, called several times without response Disposition Plan: To be determined  Consultants:  None  Procedures:  None   Microbiology: None   Antimicrobials: None    Objective: Vitals:   09/08/19 0539 09/08/19 0540 09/08/19 0541 09/08/19 0703  BP:    104/64  Pulse:    (!) 56  Resp:     16  Temp:    97.6 F (36.4 C)  TempSrc:    Oral  SpO2: 100% 99% 97% 100%  Weight:      Height:        Intake/Output Summary (Last 24 hours) at 09/08/2019 1123 Last data filed at 09/07/2019 1653 Gross per 24 hour  Intake 480 ml  Output -  Net 480 ml   Filed Weights   09/07/19 0242  Weight: 87.5 kg    Examination:  Constitutional: no distress Eyes: No scleral icterus Respiratory: CTA biL, no wheezing  Cardiovascular: RRRR  Data Reviewed: I have independently reviewed following labs and imaging studies   CBC: Recent Labs  Lab 09/06/19 1531 09/08/19 0443  WBC 7.9 8.7  HGB 14.5 14.1  HCT 44.1 43.6  MCV 87.7 88.3  PLT 171 185   Basic Metabolic Panel: Recent Labs  Lab 09/06/19 1531 09/08/19 0443  NA 133* 137  K 3.7 4.3  CL 100 104  CO2 25 27  GLUCOSE 152* 172*  BUN 16 22  CREATININE 0.91 0.93  CALCIUM 9.0 9.1   GFR: Estimated Creatinine Clearance: 76.3 mL/min (by C-G formula based on SCr of 0.93 mg/dL). Liver Function Tests: No results for input(s): AST, ALT, ALKPHOS, BILITOT, PROT, ALBUMIN in the last 168 hours. No results for input(s): LIPASE, AMYLASE in the last 168 hours. No results for input(s): AMMONIA in the last 168 hours. Coagulation Profile: No results for input(s): INR, PROTIME in the last 168 hours. Cardiac Enzymes: No results for input(s): CKTOTAL, CKMB, CKMBINDEX, TROPONINI in the last 168 hours. BNP (last 3 results) No results for input(s): PROBNP in the last 8760 hours. HbA1C: No results for input(s): HGBA1C in the last 72 hours. CBG: Recent Labs  Lab 09/01/19 1516 09/01/19 2032 09/02/19 0728 09/02/19 1139 09/02/19 1534  GLUCAP 320* 357* 185* 223* 352*   Lipid Profile: No results for input(s): CHOL, HDL, LDLCALC, TRIG, CHOLHDL, LDLDIRECT in the last 72 hours. Thyroid Function Tests: No results for input(s): TSH, T4TOTAL, FREET4, T3FREE, THYROIDAB in the last 72 hours. Anemia Panel: No results for input(s): VITAMINB12, FOLATE,  FERRITIN, TIBC, IRON, RETICCTPCT in the last 72 hours. Urine analysis:    Component Value Date/Time   COLORURINE STRAW (A) 04/17/2018 1348   APPEARANCEUR CLEAR 04/17/2018 1348   LABSPEC 1.008 04/17/2018 1348   PHURINE 8.0 04/17/2018 1348   GLUCOSEU NEGATIVE 04/17/2018 1348   HGBUR NEGATIVE 04/17/2018 1348   BILIRUBINUR NEGATIVE 04/17/2018 1348   KETONESUR NEGATIVE 04/17/2018 1348   PROTEINUR NEGATIVE 04/17/2018 1348   UROBILINOGEN 0.2 06/10/2014 0027   NITRITE NEGATIVE 04/17/2018 1348   LEUKOCYTESUR NEGATIVE 04/17/2018 1348   Sepsis Labs: Invalid input(s): PROCALCITONIN, LACTICIDVEN  No results found for this or any previous visit (from the past 240 hour(s)).    Radiology Studies: Dg Chest 2 View  Result Date: 09/06/2019 CLINICAL  DATA:  Chest pain EXAM: CHEST - 2 VIEW COMPARISON:  08/28/2019 FINDINGS: COPD with hyperinflation.  Bibasilar scarring/atelectasis unchanged. Prior median sternotomy. Negative for heart failure. Negative for pneumonia or effusion. Chronic rib fractures bilaterally. IMPRESSION: COPD with bibasilar scarring. No superimposed acute abnormality and no change from the prior study. Electronically Signed   By: Franchot Gallo M.D.   On: 09/06/2019 17:01   Marzetta Board, MD, PhD [facility]  Contact via  [email or web address]  [place] P: [phone] F: [phone]

## 2019-09-09 DIAGNOSIS — J449 Chronic obstructive pulmonary disease, unspecified: Secondary | ICD-10-CM | POA: Diagnosis not present

## 2019-09-09 DIAGNOSIS — U071 COVID-19: Secondary | ICD-10-CM | POA: Diagnosis not present

## 2019-09-09 DIAGNOSIS — E119 Type 2 diabetes mellitus without complications: Secondary | ICD-10-CM | POA: Diagnosis not present

## 2019-09-09 DIAGNOSIS — R531 Weakness: Secondary | ICD-10-CM | POA: Diagnosis not present

## 2019-09-09 NOTE — Progress Notes (Signed)
CSW sent referrals through [place] [facility] to the following providers for SNAP/ WIC/ Other nutritional benefits:   [place] Department  [place] Department   Kingsley Spittle, LCSW [facility] [place]  [phone]

## 2019-09-09 NOTE — Discharge Summary (Addendum)
Physician Discharge Summary  [name] Current HGD:[id number] DOB: 10/15/1951 DOA: 09/06/2019  PCP: Jackie Plum, MD  Admit date: 09/06/2019 Discharge date: 09/09/2019  Admitted From: home Disposition:  Home with home health  Recommendations for Outpatient Follow-up:  1. Follow up with PCP in 1-2 weeks  Home Health: PT, OT, aide Equipment/Devices: none   Discharge Condition: stable CODE STATUS: Full code Diet recommendation: diabetic   HPI: Per admitting MD, Ian Morton is a 68 y.o. male with medical history significant of CAD, DM2, HTN, COPD not on O2 at baseline. Patient recently admitted from 9/23-9/28 with COVID.  COVID improved with steroids and time.  At discharge was on RA at rest and 2L with ambulation.  Discharged home on steroid taper.  Patient however has had worsening generalized weakness, fatigue, intermittent chest tightness across his chest.  Symptoms apparently worsening. No N/V, no recent fevers, no diarrhea.  Hospital Course: Principal Problem Generalized weakness post Covid-19 -Sequela of ongoing/recent COVID-19 illness as well as steroid use.  Physical therapy consulted and evaluated patient while hospitalized, recommending home health PT.  Patient is estranged from his family and lives in a 1 bedroom apartment by himself, his former wife and kids are not willing to help but they will reach out to his [facility] for assistance for the patient.  Patient has already been on more than 10 days of steroids, will discontinue in case that they have contributed to his weakness.  His weakness seems to be improving, when working with physical therapy he was able to ambulate about 50 feet with no assistive devices maintaining sats above 95%.  He will be discharged home in stable condition and his home health therapies have been maximized.  Active Problems Syncopal episode -In the ED, probably vasovagal as this occurred while blood being drawn.  2D echo unremarkable, full read below.   No events on telemetry.  EKG without ischemic findings, troponin was negative x2.  His d-dimer was negative. COPD -No wheezing, not acute exacerbation, continue home medications History of CAD -Continue aspirin, Plavix, statin Type 2 diabetes mellitus, non-insulin-dependent -Continued on home medications Hypertension -Continue home medications  Discharge Diagnoses:  Principal Problem:   Generalized weakness Active Problems:   Hypertension   Diabetes mellitus type II, non insulin dependent (HCC)   Coronary artery disease   COPD (chronic obstructive pulmonary disease) (HCC)   COVID-19   Syncope   Weakness  Discharge Instructions  Allergies as of 09/09/2019      Reactions   Tramadol Nausea Only      Medication List    STOP taking these medications   acyclovir 400 MG tablet Commonly known as: ZOVIRAX   predniSONE 10 MG tablet Commonly known as: DELTASONE     TAKE these medications   ammonium lactate 12 % cream Commonly known as: AMLACTIN APPLY TO AFFECTED AREA AS NEEDED FOR DRY SKIN   aspirin 325 MG EC tablet Take 1 tablet (325 mg total) by mouth daily.   atorvastatin 80 MG tablet Commonly known as: LIPITOR Take 1 tablet (80 mg total) by mouth daily at 6 PM. What changed: how much to take   busPIRone 10 MG tablet Commonly known as: BUSPAR Take 10 mg by mouth 2 (two) times daily.   cholecalciferol 1000 units tablet Commonly known as: VITAMIN D Take 1,000 Units by mouth daily.   clopidogrel 75 MG tablet Commonly known as: PLAVIX Take 75 mg by mouth daily.   clotrimazole-betamethasone cream Commonly known as: LOTRISONE Apply 1 application topically  2 (two) times daily as needed.   dicyclomine 20 MG tablet Commonly known as: BENTYL Take 20 mg by mouth 3 (three) times daily.   DULoxetine 60 MG capsule Commonly known as: CYMBALTA Take 60 mg by mouth 2 (two) times daily.   fluticasone 50 MCG/ACT nasal spray Commonly known as: FLONASE Place 2 sprays into  both nostrils daily.   Fluticasone-Salmeterol 500-50 MCG/DOSE Aepb Commonly known as: ADVAIR Inhale 1 puff into the lungs 2 (two) times daily.   glipiZIDE 2.5 MG 24 hr tablet Commonly known as: GLUCOTROL XL Take 2.5 mg by mouth daily with breakfast.   HYDROcodone-acetaminophen 5-325 MG tablet Commonly known as: NORCO/VICODIN Take 1 tablet by mouth every 6 (six) hours as needed for pain.   hydrocortisone 2.5 % rectal cream Commonly known as: ANUSOL-HC Place 1 application rectally 2 (two) times daily as needed for hemorrhoids or anal itching.   linaclotide 290 MCG Caps capsule Commonly known as: LINZESS Take 290 mcg by mouth daily.   lisinopril 10 MG tablet Commonly known as: ZESTRIL Take 1 tablet (10 mg total) by mouth daily. What changed: how much to take   loratadine 10 MG tablet Commonly known as: CLARITIN Take 10 mg by mouth daily.   Magnesium 250 MG Tabs Take 3 tablets by mouth at bedtime.   meclizine 25 MG tablet Commonly known as: ANTIVERT Take 25 mg by mouth 3 (three) times daily as needed for dizziness.   metFORMIN 500 MG tablet Commonly known as: GLUCOPHAGE Take 1,000 mg by mouth 2 (two) times daily with a meal. Take 1/2 tablet   metoprolol succinate 25 MG 24 hr tablet Commonly known as: TOPROL-XL Take 25 mg by mouth daily.   montelukast 10 MG tablet Commonly known as: SINGULAIR Take 10 mg by mouth at bedtime.   nitroGLYCERIN 0.4 MG SL tablet Commonly known as: NITROSTAT Place 0.4 mg under the tongue every 5 (five) minutes as needed for chest pain. Up to 3 times-see dr. If no relief   omeprazole 40 MG capsule Commonly known as: PRILOSEC Take 40 mg by mouth daily.   polyethylene glycol 17 g packet Commonly known as: MIRALAX / GLYCOLAX Take 17 g by mouth daily. Until normal bowel movements   pramoxine 1 % foam Commonly known as: Proctofoam Place 1 application rectally 3 (three) times daily as needed for anal itching.   pregabalin 100 MG capsule  Commonly known as: LYRICA Take 100 mg by mouth 3 (three) times daily.   albuterol (5 MG/ML) 0.5% nebulizer solution Commonly known as: PROVENTIL Take 2.5 mg by nebulization every 6 (six) hours as needed for wheezing or shortness of breath. What changed: Another medication with the same name was removed. Continue taking this medication, and follow the directions you see here.   ProAir HFA 108 (90 Base) MCG/ACT inhaler Generic drug: albuterol Inhale 2 puffs into the lungs every 6 (six) hours as needed. What changed: Another medication with the same name was removed. Continue taking this medication, and follow the directions you see here.   risperiDONE 0.5 MG tablet Commonly known as: RISPERDAL Take 0.5 mg by mouth 2 (two) times daily.   tamsulosin 0.4 MG Caps capsule Commonly known as: FLOMAX Take 0.4 mg by mouth at bedtime.   triamcinolone 0.1 % paste Commonly known as: KENALOG Use as directed 1 application in the mouth or throat 2 (two) times daily. Dental paste. Apply to the affected area twice daily   Vitamin B Complex-C Caps Frequency:Daily   Dosage:1  Instructions:  Note:TAKE 1 CAPSULE DAILY.      Follow-up Information    Osei-Bonsu, Iona Beard, MD. Schedule an appointment as soon as possible for a visit in 1 week(s).   Specialty: Internal Medicine Contact information: [address] [place] [address] [phone]           Consultations:  None   Procedures/Studies:  2D echo  IMPRESSIONS    1. Left ventricular ejection fraction, by visual estimation, is 55 to 60%. The left ventricle has normal function. Normal left ventricular size. Left ventricular septal wall thickness was moderately increased. Moderately increased left ventricular  posterior wall thickness. There is moderately increased left ventricular hypertrophy.  2. Left ventricular diastolic Doppler parameters are consistent with impaired relaxation pattern of LV diastolic  filling.  3. Global right ventricle has normal systolic function.The right ventricular size is normal. No increase in right ventricular wall thickness.  4. Left atrial size was normal.  5. Right atrial size was normal.  6. The mitral valve is normal in structure. Trace mitral valve regurgitation. No evidence of mitral stenosis.  7. The tricuspid valve is normal in structure. Tricuspid valve regurgitation is trivial.  8. The aortic valve is normal in structure. Aortic valve regurgitation is mild by color flow Doppler. Structurally normal aortic valve, with no evidence of sclerosis or stenosis.  9. Aortic valve leaflets mildly thickened and calcified. 10. The pulmonic valve was normal in structure. Pulmonic valve regurgitation is not visualized by color flow Doppler. 11. Normal pulmonary artery systolic pressure. 12. The inferior vena cava is normal in size with <50% respiratory variability, suggesting right atrial pressure of 8 mmHg.  Dg Chest 2 View  Result Date: 09/06/2019 CLINICAL DATA:  Chest pain EXAM: CHEST - 2 VIEW COMPARISON:  08/28/2019 FINDINGS: COPD with hyperinflation.  Bibasilar scarring/atelectasis unchanged. Prior median sternotomy. Negative for heart failure. Negative for pneumonia or effusion. Chronic rib fractures bilaterally. IMPRESSION: COPD with bibasilar scarring. No superimposed acute abnormality and no change from the prior study. Electronically Signed   By: Franchot Gallo M.D.   On: 09/06/2019 17:01   Ct Angio Chest Pe W And/or Wo Contrast  Result Date: 08/28/2019 CLINICAL DATA:  Chest pain, shortness of breath EXAM: CT ANGIOGRAPHY CHEST WITH CONTRAST TECHNIQUE: Multidetector CT imaging of the chest was performed using the standard protocol during bolus administration of intravenous contrast. Multiplanar CT image reconstructions and MIPs were obtained to evaluate the vascular anatomy. CONTRAST:  6mL OMNIPAQUE IOHEXOL 350 MG/ML SOLN COMPARISON:  05/31/2019 FINDINGS:  Cardiovascular: No filling defects in the pulmonary arteries to suggest pulmonary emboli. Prior CABG. Heart is borderline in size. Scattered aortic atherosclerosis. No aneurysm. Mediastinum/Nodes: No mediastinal, hilar, or axillary adenopathy. Trachea and esophagus are unremarkable. Thyroid unremarkable. Lungs/Pleura: Linear scarring in the lingula at the left base. No confluent opacities or effusions. Upper Abdomen: Imaging into the upper abdomen shows no acute findings. Musculoskeletal: Chest wall soft tissues are unremarkable. No acute bony abnormality. Review of the MIP images confirms the above findings. IMPRESSION: No evidence of pulmonary embolus. Prior CABG. Aortic Atherosclerosis (ICD10-I70.0). No acute cardiopulmonary disease. Electronically Signed   By: Rolm Baptise M.D.   On: 08/28/2019 23:18   Dg Chest Port 1 View  Result Date: 08/28/2019 CLINICAL DATA:  Shortness of breath, cough and chest pain. EXAM: PORTABLE CHEST 1 VIEW COMPARISON:  07/05/2019 FINDINGS: Previous median sternotomy. Heart size is normal. Chronic aortic atherosclerosis. Chronic interstitial markings more prominent in the mid and lower lungs. No sign  of active infiltrate, collapse or effusion. No acute finding. IMPRESSION: No active disease. Previous median sternotomy. Aortic atherosclerosis. Chronic interstitial lung markings more prominent in the lower lungs. Electronically Signed   By: Paulina Fusi M.D.   On: 08/28/2019 20:42     Subjective: - no chest pain, shortness of breath, no abdominal pain, nausea or vomiting.   Discharge Exam: BP 104/84 (BP Location: Left Arm)   Pulse 62   Temp 97.8 F (36.6 C) (Oral)   Resp 18   Ht 5\' 10"  (1.778 m)   Wt 87.5 kg   SpO2 98%   BMI 27.67 kg/m   General: Pt is alert, awake, not in acute distress Cardiovascular: RRR, S1/S2 +, no rubs, no gallops Respiratory: CTA bilaterally, no wheezing, no rhonchi Abdominal: Soft, NT, ND, bowel sounds + Extremities: no edema, no  cyanosis    The results of significant diagnostics from this hospitalization (including imaging, microbiology, ancillary and laboratory) are listed below for reference.     Microbiology: No results found for this or any previous visit (from the past 240 hour(s)).   Labs: BNP (last 3 results) Recent Labs    08/28/19 2243  BNP 20.6   Basic Metabolic Panel: Recent Labs  Lab 09/06/19 1531 09/08/19 0443  NA 133* 137  K 3.7 4.3  CL 100 104  CO2 25 27  GLUCOSE 152* 172*  BUN 16 22  CREATININE 0.91 0.93  CALCIUM 9.0 9.1   Liver Function Tests: No results for input(s): AST, ALT, ALKPHOS, BILITOT, PROT, ALBUMIN in the last 168 hours. No results for input(s): LIPASE, AMYLASE in the last 168 hours. No results for input(s): AMMONIA in the last 168 hours. CBC: Recent Labs  Lab 09/06/19 1531 09/08/19 0443  WBC 7.9 8.7  HGB 14.5 14.1  HCT 44.1 43.6  MCV 87.7 88.3  PLT 171 185   Cardiac Enzymes: No results for input(s): CKTOTAL, CKMB, CKMBINDEX, TROPONINI in the last 168 hours. BNP: Invalid input(s): POCBNP CBG: Recent Labs  Lab 09/02/19 1534  GLUCAP 352*   D-Dimer Recent Labs    09/06/19 2104  DDIMER 0.49   Hgb A1c No results for input(s): HGBA1C in the last 72 hours. Lipid Profile No results for input(s): CHOL, HDL, LDLCALC, TRIG, CHOLHDL, LDLDIRECT in the last 72 hours. Thyroid function studies No results for input(s): TSH, T4TOTAL, T3FREE, THYROIDAB in the last 72 hours.  Invalid input(s): FREET3 Anemia work up No results for input(s): VITAMINB12, FOLATE, FERRITIN, TIBC, IRON, RETICCTPCT in the last 72 hours. Urinalysis    Component Value Date/Time   COLORURINE STRAW (A) 04/17/2018 1348   APPEARANCEUR CLEAR 04/17/2018 1348   LABSPEC 1.008 04/17/2018 1348   PHURINE 8.0 04/17/2018 1348   GLUCOSEU NEGATIVE 04/17/2018 1348   HGBUR NEGATIVE 04/17/2018 1348   BILIRUBINUR NEGATIVE 04/17/2018 1348   KETONESUR NEGATIVE 04/17/2018 1348   PROTEINUR NEGATIVE  04/17/2018 1348   UROBILINOGEN 0.2 06/10/2014 0027   NITRITE NEGATIVE 04/17/2018 1348   LEUKOCYTESUR NEGATIVE 04/17/2018 1348   Sepsis Labs Invalid input(s): PROCALCITONIN,  WBC,  LACTICIDVEN  FURTHER DISCHARGE INSTRUCTIONS:   Get Medicines reviewed and adjusted: Please take all your medications with you for your next visit with your Primary MD   Laboratory/radiological data: Please request your Primary MD to go over all hospital tests and procedure/radiological results at the follow up, please ask your Primary MD to get all Hospital records sent to his/her office.   In some cases, they will be blood work, cultures and biopsy results  pending at the time of your discharge. Please request that your primary care M.D. goes through all the records of your hospital data and follows up on these results.   Also Note the following: If you experience worsening of your admission symptoms, develop shortness of breath, life threatening emergency, suicidal or homicidal thoughts you must seek medical attention immediately by calling 911 or calling your MD immediately  if symptoms less severe.   You must read complete instructions/literature along with all the possible adverse reactions/side effects for all the Medicines you take and that have been prescribed to you. Take any new Medicines after you have completely understood and accpet all the possible adverse reactions/side effects.    Do not drive when taking Pain medications or sleeping medications (Benzodaizepines)   Do not take more than prescribed Pain, Sleep and Anxiety Medications. It is not advisable to combine anxiety,sleep and pain medications without talking with your primary care practitioner   Special Instructions: If you have smoked or chewed Tobacco  in the last 2 yrs please stop smoking, stop any regular Alcohol  and or any Recreational drug use.   Wear Seat belts while driving.   Please note: You were cared for by a hospitalist  during your hospital stay. Once you are discharged, your primary care physician will handle any further medical issues. Please note that NO REFILLS for any discharge medications will be authorized once you are discharged, as it is imperative that you return to your primary care physician (or establish a relationship with a primary care physician if you do not have one) for your post hospital discharge needs so that they can reassess your need for medications and monitor your lab values.  Time coordinating discharge: 40 minutes  SIGNED:  Pamella Pertostin Ivory Bail, MD, PhD 09/09/2019, 2:52 PM

## 2019-09-09 NOTE — Care Management (Signed)
Case Manager has arranged for [facility] to transport patient back to his apartment. Medical  Necessity form and  has been completed and printed to the nursing unit. Case manager spoke with patient via nurse's telephone and explained discharge plan. Case manager explained that his estranged wife said she would contact the [facility], which he adamantly doesn't believe. Unfortunately they have a serious family issue at this time and he doesn't anticipate any assistance. Patient says after his court date he plans to leave the country. CM tried to provide emotional support. Explained to him that ambulance will arrive between 4:00pm and 4:15pm. Case manager also updated the RN.    Ricki Miller, RN BSN Case Manager [phone]

## 2019-09-09 NOTE — Plan of Care (Addendum)
Patient currently in bed resting, no s/s of pain or distress. Medication given well tolerated. Patient scheduled for discharge today. Will continue to monitor for remainder of shift.  Problem: Education: Goal: Knowledge of risk factors and measures for prevention of condition will improve 09/09/2019 0904 by Orvan Falconer, RN Outcome: Progressing 09/09/2019 0904 by Orvan Falconer, RN Outcome: Progressing   Problem: Coping: Goal: Psychosocial and spiritual needs will be supported 09/09/2019 0904 by Orvan Falconer, RN Outcome: Progressing 09/09/2019 0904 by Orvan Falconer, RN Outcome: Progressing   Problem: Respiratory: Goal: Will maintain a patent airway 09/09/2019 0904 by Orvan Falconer, RN Outcome: Progressing 09/09/2019 0904 by Orvan Falconer, RN Outcome: Progressing Goal: Complications related to the disease process, condition or treatment will be avoided or minimized 09/09/2019 0904 by Orvan Falconer, RN Outcome: Progressing 09/09/2019 0904 by Orvan Falconer, RN Outcome: Progressing

## 2019-09-09 NOTE — Discharge Instructions (Signed)
Follow with Ian Mccreedy, MD in 5-7 days  Please get a complete blood count and chemistry panel checked by your Primary MD at your next visit, and again as instructed by your Primary MD. Please get your medications reviewed and adjusted by your Primary MD.  Please request your Primary MD to go over all Hospital Tests and Procedure/Radiological results at the follow up, please get all Hospital records sent to your Prim MD by signing hospital release before you go home.  In some cases, there will be blood work, cultures and biopsy results pending at the time of your discharge. Please request that your primary care M.D. goes through all the records of your hospital data and follows up on these results.  If you had Pneumonia of Lung problems at the Hospital: Please get a 2 view Chest X ray done in 6-8 weeks after hospital discharge or sooner if instructed by your Primary MD.  If you have Congestive Heart Failure: Please call your Cardiologist or Primary MD anytime you have any of the following symptoms:  1) 3 pound weight gain in 24 hours or 5 pounds in 1 week  2) shortness of breath, with or without a dry hacking cough  3) swelling in the hands, feet or stomach  4) if you have to sleep on extra pillows at night in order to breathe  Follow cardiac low salt diet and 1.5 lit/day fluid restriction.  If you have diabetes Accuchecks 4 times/day, Once in AM empty stomach and then before each meal. Log in all results and show them to your primary doctor at your next visit. If any glucose reading is under 80 or above 300 call your primary MD immediately.  If you have Seizure/Convulsions/Epilepsy: Please do not drive, operate heavy machinery, participate in activities at heights or participate in high speed sports until you have seen by Primary MD or a Neurologist and advised to do so again. Per [facility] statutes, patients with seizures are not allowed to drive until they have been  seizure-free for six months.  Use caution when using heavy equipment or power tools. Avoid working on ladders or at heights. Take showers instead of baths. Ensure the water temperature is not too high on the home water heater. Do not go swimming alone. Do not lock yourself in a room alone (i.e. bathroom). When caring for infants or small children, sit down when holding, feeding, or changing them to minimize risk of injury to the child in the event you have a seizure. Maintain good sleep hygiene. Avoid alcohol.   If you had Gastrointestinal Bleeding: Please ask your Primary MD to check a complete blood count within one week of discharge or at your next visit. Your endoscopic/colonoscopic biopsies that are pending at the time of discharge, will also need to followed by your Primary MD.  Get Medicines reviewed and adjusted. Please take all your medications with you for your next visit with your Primary MD  Please request your Primary MD to go over all hospital tests and procedure/radiological results at the follow up, please ask your Primary MD to get all Hospital records sent to his/her office.  If you experience worsening of your admission symptoms, develop shortness of breath, life threatening emergency, suicidal or homicidal thoughts you must seek medical attention immediately by calling 911 or calling your MD immediately  if symptoms less severe.  You must read complete instructions/literature along with all the possible adverse reactions/side effects for all the Medicines you take  and that have been prescribed to you. Take any new Medicines after you have completely understood and accpet all the possible adverse reactions/side effects.   Do not drive or operate heavy machinery when taking Pain medications.   Do not take more than prescribed Pain, Sleep and Anxiety Medications  Special Instructions: If you have smoked or chewed Tobacco  in the last 2 yrs please stop smoking, stop any regular  Alcohol  and or any Recreational drug use.  Wear Seat belts while driving.  Please note You were cared for by a hospitalist during your hospital stay. If you have any questions about your discharge medications or the care you received while you were in the hospital after you are discharged, you can call the unit and asked to speak with the hospitalist on call if the hospitalist that took care of you is not available. Once you are discharged, your primary care physician will handle any further medical issues. Please note that NO REFILLS for any discharge medications will be authorized once you are discharged, as it is imperative that you return to your primary care physician (or establish a relationship with a primary care physician if you do not have one) for your aftercare needs so that they can reassess your need for medications and monitor your lab values.  You can reach the hospitalist office at phone [phone] or fax [phone]   If you do not have a primary care physician, you can call [phone] for a physician referral.  Activity: As tolerated with Full fall precautions use walker/cane & assistance as needed    Diet: regular  Disposition Home      Person Under Monitoring Name: Ian Morton  Location: [address] [place] [address]   Infection Prevention Recommendations for Individuals Confirmed to have, or Being Evaluated for, 2019 Novel Coronavirus (COVID-19) Infection Who Receive Care at Home  Individuals who are confirmed to have, or are being evaluated for, COVID-19 should follow the prevention steps below until a healthcare provider or local or state health department says they can return to normal activities.  Stay home except to get medical care You should restrict activities outside your home, except for getting medical care. Do not go to work, school, or public areas, and do not use public transportation or taxis.  Call ahead before visiting your  doctor Before your medical appointment, call the healthcare provider and tell them that you have, or are being evaluated for, COVID-19 infection. This will help the healthcare providers office take steps to keep other people from getting infected. Ask your healthcare provider to call the local or state health department.  Monitor your symptoms Seek prompt medical attention if your illness is worsening (e.g., difficulty breathing). Before going to your medical appointment, call the healthcare provider and tell them that you have, or are being evaluated for, COVID-19 infection. Ask your healthcare provider to call the local or state health department.  Wear a facemask You should wear a facemask that covers your nose and mouth when you are in the same room with other people and when you visit a healthcare provider. People who live with or visit you should also wear a facemask while they are in the same room with you.  Separate yourself from other people in your home As much as possible, you should stay in a different room from other people in your home. Also, you should use a separate bathroom, if available.  Avoid sharing household items You should not  share dishes, drinking glasses, cups, eating utensils, towels, bedding, or other items with other people in your home. After using these items, you should wash them thoroughly with soap and water.  Cover your coughs and sneezes Cover your mouth and nose with a tissue when you cough or sneeze, or you can cough or sneeze into your sleeve. Throw used tissues in a lined trash can, and immediately wash your hands with soap and water for at least 20 seconds or use an alcohol-based hand rub.  Wash your [facility] your hands often and thoroughly with soap and water for at least 20 seconds. You can use an alcohol-based hand sanitizer if soap and water are not available and if your hands are not visibly dirty. Avoid touching your eyes, nose,  and mouth with unwashed hands.   Prevention Steps for Caregivers and Household Members of Individuals Confirmed to have, or Being Evaluated for, COVID-19 Infection Being Cared for in the Home  If you live with, or provide care at home for, a person confirmed to have, or being evaluated for, COVID-19 infection please follow these guidelines to prevent infection:  Follow healthcare providers instructions Make sure that you understand and can help the patient follow any healthcare provider instructions for all care.  Provide for the patients basic needs You should help the patient with basic needs in the home and provide support for getting groceries, prescriptions, and other personal needs.  Monitor the patients symptoms If they are getting sicker, call his or her medical provider and tell them that the patient has, or is being evaluated for, COVID-19 infection. This will help the healthcare providers office take steps to keep other people from getting infected. Ask the healthcare provider to call the local or state health department.  Limit the number of people who have contact with the patient  If possible, have only one caregiver for the patient.  Other household members should stay in another home or place of residence. If this is not possible, they should stay  in another room, or be separated from the patient as much as possible. Use a separate bathroom, if available.  Restrict visitors who do not have an essential need to be in the home.  Keep older adults, very young children, and other sick people away from the patient Keep older adults, very young children, and those who have compromised immune systems or chronic health conditions away from the patient. This includes people with chronic heart, lung, or kidney conditions, diabetes, and cancer.  Ensure good ventilation Make sure that shared spaces in the home have good air flow, such as from an air conditioner or an  opened window, weather permitting.  Wash your hands often  Wash your hands often and thoroughly with soap and water for at least 20 seconds. You can use an alcohol based hand sanitizer if soap and water are not available and if your hands are not visibly dirty.  Avoid touching your eyes, nose, and mouth with unwashed hands.  Use disposable paper towels to dry your hands. If not available, use dedicated cloth towels and replace them when they become wet.  Wear a facemask and gloves  Wear a disposable facemask at all times in the room and gloves when you touch or have contact with the patients blood, body fluids, and/or secretions or excretions, such as sweat, saliva, sputum, nasal mucus, vomit, urine, or feces.  Ensure the mask fits over your nose and mouth tightly, and do not touch it  during use.  Throw out disposable facemasks and gloves after using them. Do not reuse.  Wash your hands immediately after removing your facemask and gloves.  If your personal clothing becomes contaminated, carefully remove clothing and launder. Wash your hands after handling contaminated clothing.  Place all used disposable facemasks, gloves, and other waste in a lined container before disposing them with other household waste.  Remove gloves and wash your hands immediately after handling these items.  Do not share dishes, glasses, or other household items with the patient  Avoid sharing household items. You should not share dishes, drinking glasses, cups, eating utensils, towels, bedding, or other items with a patient who is confirmed to have, or being evaluated for, COVID-19 infection.  After the person uses these items, you should wash them thoroughly with soap and water.  Wash laundry thoroughly  Immediately remove and wash clothes or bedding that have blood, body fluids, and/or secretions or excretions, such as sweat, saliva, sputum, nasal mucus, vomit, urine, or feces, on them.  Wear gloves  when handling laundry from the patient.  Read and follow directions on labels of laundry or clothing items and detergent. In general, wash and dry with the warmest temperatures recommended on the label.  Clean all areas the individual has used often  Clean all touchable surfaces, such as counters, tabletops, doorknobs, bathroom fixtures, toilets, phones, keyboards, tablets, and bedside tables, every day. Also, clean any surfaces that may have blood, body fluids, and/or secretions or excretions on them.  Wear gloves when cleaning surfaces the patient has come in contact with.  Use a diluted bleach solution (e.g., dilute bleach with 1 part bleach and 10 parts water) or a household disinfectant with a label that says EPA-registered for coronaviruses. To make a bleach solution at home, add 1 tablespoon of bleach to 1 quart (4 cups) of water. For a larger supply, add  cup of bleach to 1 gallon (16 cups) of water.  Read labels of cleaning products and follow recommendations provided on product labels. Labels contain instructions for safe and effective use of the cleaning product including precautions you should take when applying the product, such as wearing gloves or eye protection and making sure you have good ventilation during use of the product.  Remove gloves and wash hands immediately after cleaning.  Monitor yourself for signs and symptoms of illness Caregivers and household members are considered close contacts, should monitor their health, and will be asked to limit movement outside of the home to the extent possible. Follow the monitoring steps for close contacts listed on the symptom monitoring form.   ? If you have additional questions, contact your local health department or call the epidemiologist on call at [phone] (available 24/7). ? This guidance is subject to change. For the most up-to-date guidance from CDC, please refer to their  website: YouBlogs.pl  People who have recovered from the coronavirus, can help a person still fighting the virus and potentially help them recover by giving them convalescent plasma.  What is COVID-19 convalescent plasma? Convalescent plasma (CPP) is plasma collected from people who have recovered from the coronavirus. People who recover from coronavirus infection have developed antibodies to the virus that remain in the plasma portion of their blood. Transfusing the plasma that contains the antibodies into a person still fighting the virus can provide a boost to the patients immune system and potentially help them recover. The experimental treatment is approved by the FDA to be used on an emergency basis  and is called COVID-19 convalescent plasma." Critically ill patients who meet the FDA criteria to receive this therapy can be treated for life-threatening COVID-19.  Can I donate convalescent plasma or blood after being diagnosed with COVID-19? Donors must meet all the required screening criteria for blood donation and the additional FDA criteria, as follows:   Prior diagnosis of COVID-19 documented by an FDA approved laboratory test   A positive diagnostic test at the time of illness OR   A positive serological test for SARS-CoV-2 antibodies after recovery  Complete resolution of symptoms at least 14 days prior to donation and a documented negative COVID-19 FDA approved test OR   Complete resolution of symptoms at least 28 days prior to donation As a part of your pre-donation process you will be required to provide your COVID-19 test result(s).  Where can I donate COVID-19 convalescent plasma? You can complete the pre-donation form at [email or web address] and a donor specialist will be in contact within 24-48 hours.  What is the process for donating COVID-19 Convalescent Plasma?  1. The first step in the donation process  is to obtain a copy of your test results or a letter from the testing facility notifying you of your positive result and the date it was taken 2. Complete the pre-donation form at [email or web address] 3. A representative will be in contact within 24-48 hours to provide additional insight into the process 4. Once the process begins, [facility] will ensure you   Meet the required screening criteria for blood donation   Have the required test results   Meet the criteria for resolution of your symptoms   Complete resolution of symptoms at least 14 days prior to donation and a documented negative COVID-19 FDA approved test OR   Complete resolution of symptoms at least 28 days prior to donation 5. [facility] will then schedule a collection date and time to collect your COVID-19 convalescent plasma  Is the COVID-19 convalescent plasma donation safe? It is safe to donate COVID-19 convalescent plasma and done in the same way that thousands of blood donors donate blood products every day.  My family member or friend is being treated for COVID-19. Can I donate to help them if I am eligible? A directed donation will need to be arranged in conjunction with your family members care provider.  The care provider will determine whether COVID-19 convalescent plasma is the best course of treatment   A directed donation must be arranged through the Clinician Referral page that can be found at [email or web address]   The clinician will need to provide [facility] with information about both you and the patient for which the donation is directed   Please note there are additional qualifications that are required such as your blood type and the patients blood type We encourage you to consider beginning the pre-registration process even if your loved one will not be receiving COVID-19 convalescent plasma as a part of their medical treatment. The need for COVID-19 convalescent plasma increases daily and your donated  COVID-19 convalescent plasma could be life-saving for another COVID-19 patient.  Please go to the website [email or web address] if you would like to consider volunteer for plasma donation.

## 2019-09-09 NOTE — TOC Transition Note (Addendum)
Transition of Care [facility]) - CM/SW Discharge Note   Patient Details  Name: Johnta Couts MRN: [id number] Date of Birth: 29-Aug-1951  Transition of [facility]) CM/SW Contact:  Ninfa Meeker, RN Phone Number: [phone] (working remotely) 09/09/2019, 1:52 PM   Clinical Narrative: 68 yr old male readmitted for weakness related to [place]. Patient was previously setup with [facility], no changes to that. Has Oxygen at home.  Case Manager spoke with Patient's daughter- Estanislao Harmon- [phone],  in attempt to get patient further assistance at home.  Laurence Ferrari states that the family is estranged, no one is talking to patient, period. She will ask her mom to contact his [employer] to see if friends from there will take him food and see if they can assist patient. Laurence Ferrari said that should patient die, call her, other than that there is no need to call her or the mom. Case manager updated Dr. Renne Crigler.Patient will discharge today, CM will arrange for [facility] transport back to his apartment.    1416: Case manager received call from patient's wife who had questions about her husband and asked that we send him to a SNF. CM explained that he does not have a need to go and explained what he needs at home. Mrs. Schellhorn said they are separated and she and her children will not be doing anything. She requested the number to contact patient's nurse so she can speak with him.      Final next level of care: Home w Home Health Services Barriers to Discharge: No Barriers Identified   Patient Goals and CMS Choice        Discharge Placement                       Discharge Plan and Services   Discharge Planning Services: CM Consult                                 Social Determinants of Health (SDOH) Interventions     Readmission Risk Interventions No flowsheet data found.

## 2019-09-10 LAB — NOVEL CORONAVIRUS, NAA (HOSP ORDER, SEND-OUT TO REF LAB; TAT 18-24 HRS): SARS-CoV-2, NAA: DETECTED — AB

## 2020-08-14 IMAGING — DX DG CHEST 2V
2 series · 2 of 2 positions shown · non-contrast
Comparison: 08/28/2019

CLINICAL DATA: Chest pain

EXAM:
CHEST - 2 VIEW

[chest pa]
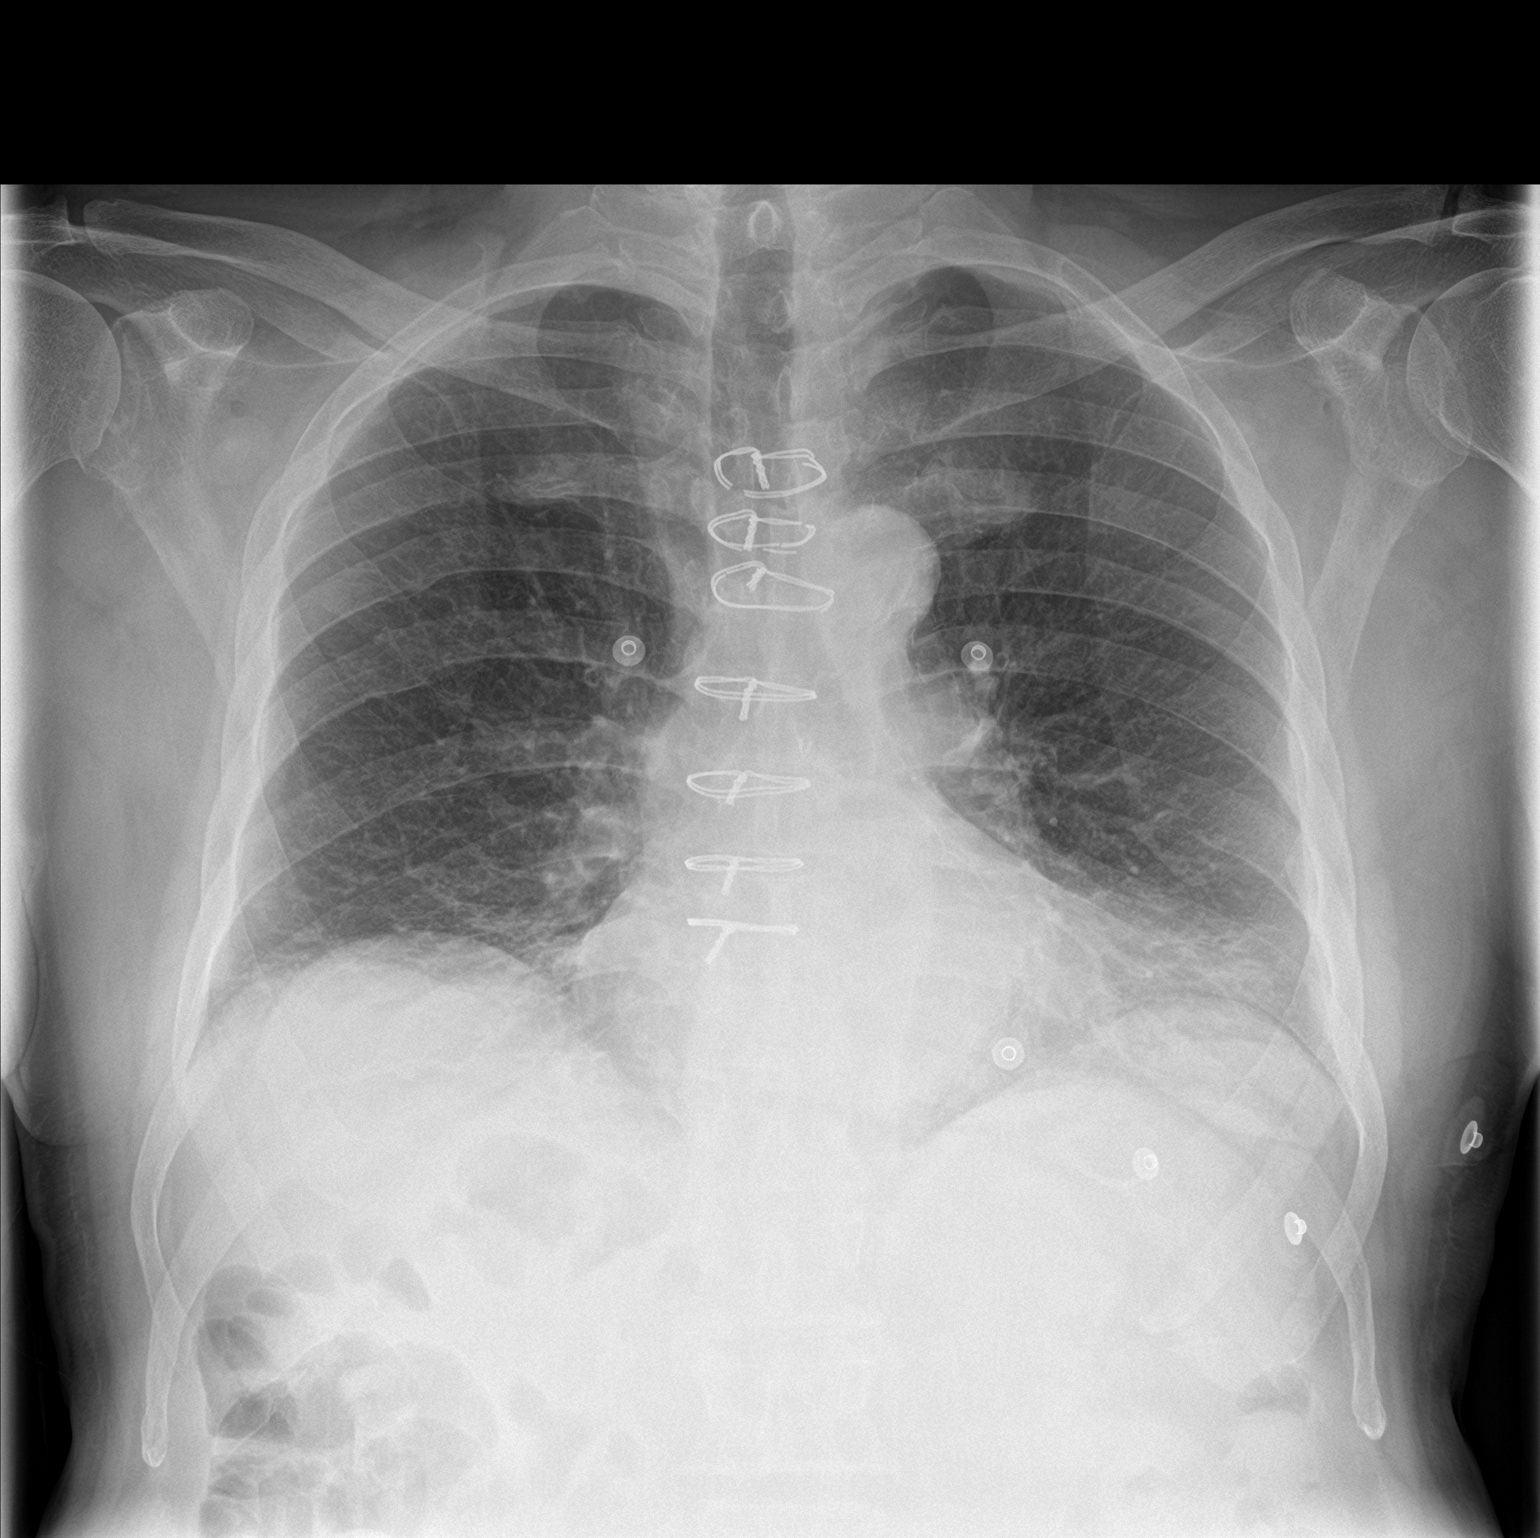

[chest lat]
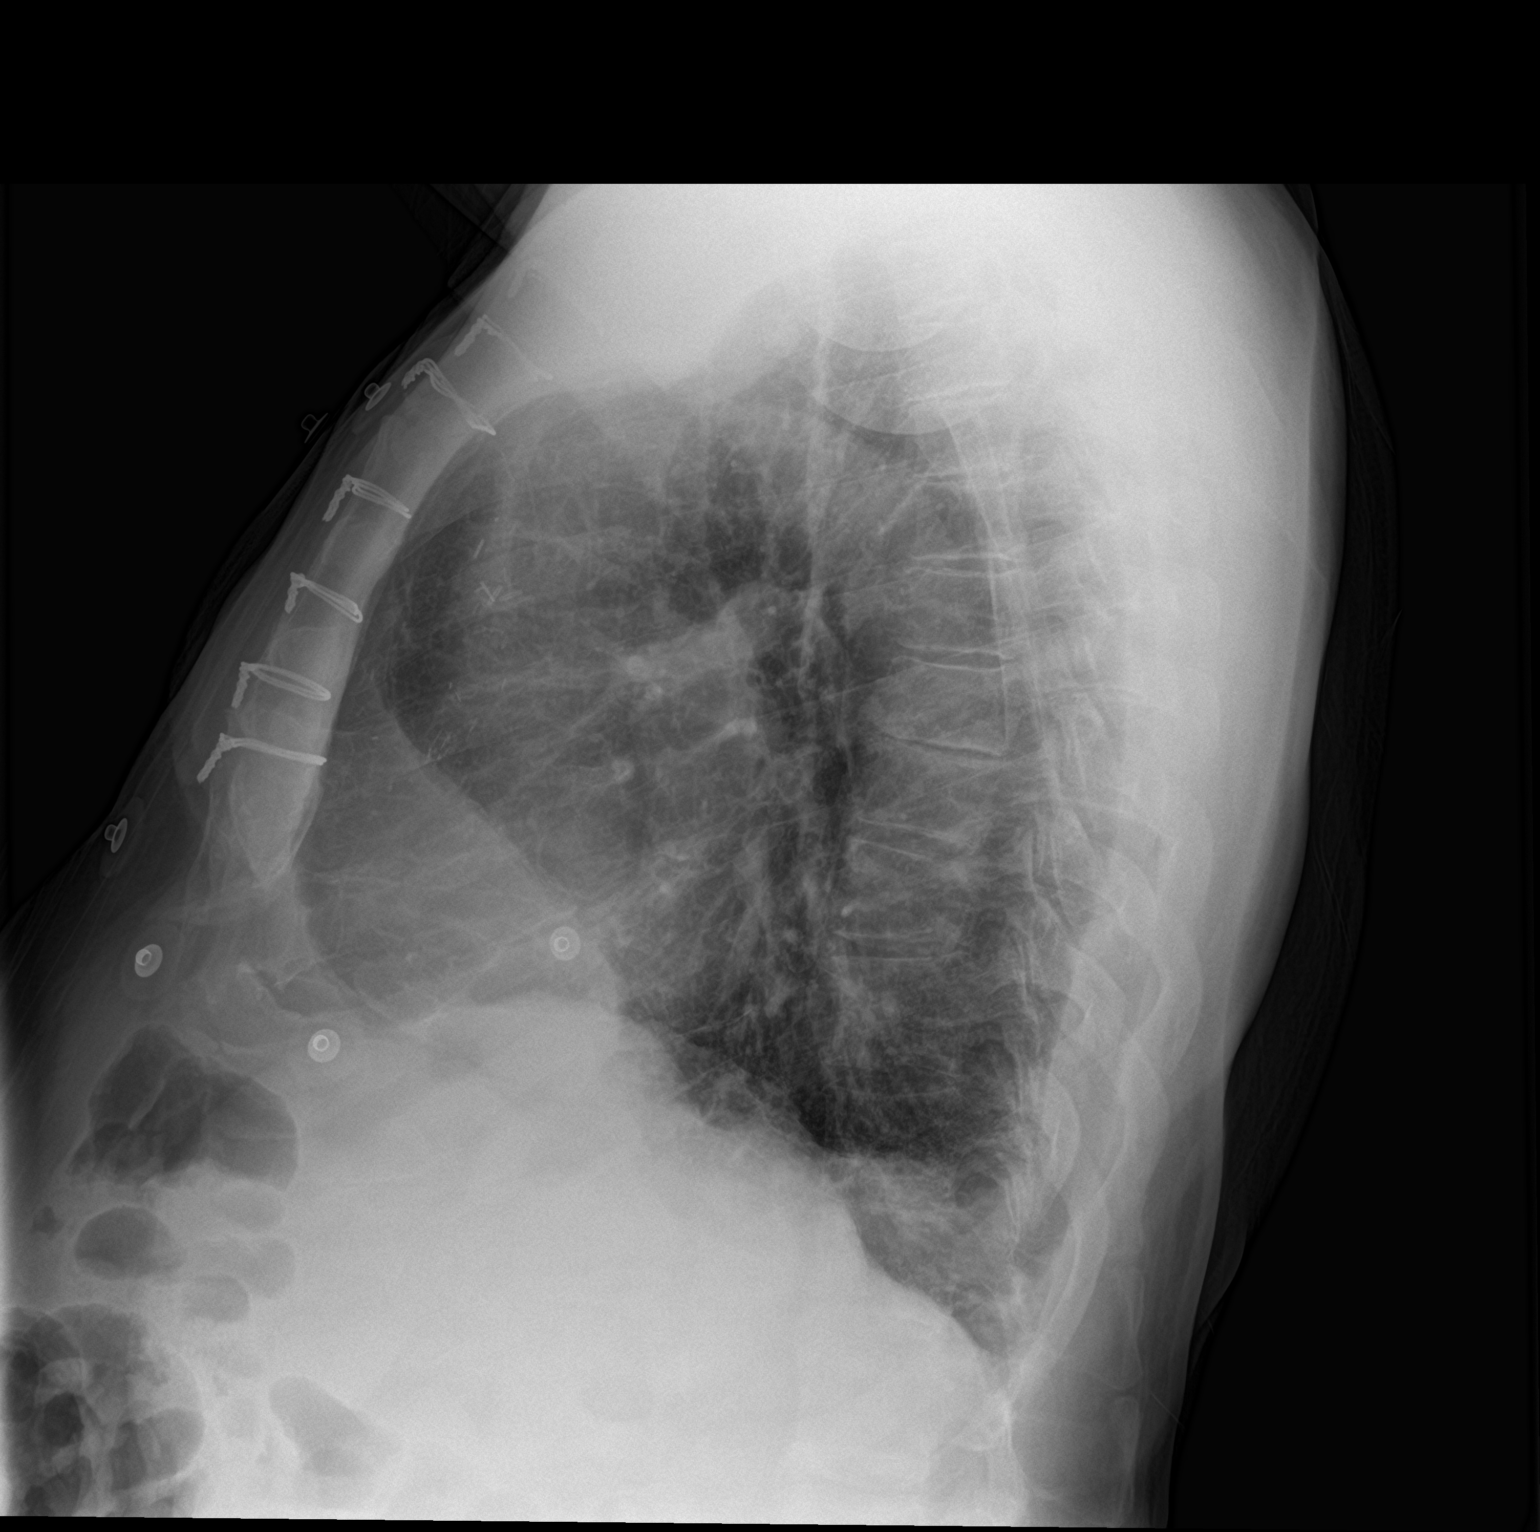

[2 of 2 positions shown; findings below may reference images not displayed]

FINDINGS: COPD with hyperinflation.  Bibasilar scarring/atelectasis unchanged.

Prior median sternotomy. Negative for heart failure. Negative for
pneumonia or effusion. Chronic rib fractures bilaterally.
IMPRESSION: COPD with bibasilar scarring. No superimposed acute abnormality and
no change from the prior study.
